# Patient Record
Sex: Female | Born: 1970 | Race: Black or African American | Hispanic: No | Marital: Married | Smoking: Never smoker
Health system: Southern US, Community
[De-identification: ages and names within clinical notes are randomized; demographics above are authoritative.]

## PROBLEM LIST (undated history)

## (undated) DIAGNOSIS — J302 Other seasonal allergic rhinitis: Secondary | ICD-10-CM

## (undated) DIAGNOSIS — N83209 Unspecified ovarian cyst, unspecified side: Secondary | ICD-10-CM

## (undated) DIAGNOSIS — G500 Trigeminal neuralgia: Secondary | ICD-10-CM

## (undated) DIAGNOSIS — I10 Essential (primary) hypertension: Secondary | ICD-10-CM

## (undated) DIAGNOSIS — N189 Chronic kidney disease, unspecified: Secondary | ICD-10-CM

## (undated) HISTORY — DX: Trigeminal neuralgia: G50.0

## (undated) HISTORY — DX: Essential (primary) hypertension: I10

## (undated) HISTORY — DX: Other seasonal allergic rhinitis: J30.2

---

## 1997-09-25 ENCOUNTER — Inpatient Hospital Stay (HOSPITAL_COMMUNITY): Admission: AD | Admit: 1997-09-25 | Discharge: 1997-09-25 | Payer: Self-pay | Admitting: Obstetrics and Gynecology

## 1997-10-01 ENCOUNTER — Inpatient Hospital Stay (HOSPITAL_COMMUNITY): Admission: AD | Admit: 1997-10-01 | Discharge: 1997-10-03 | Payer: Self-pay | Admitting: Obstetrics and Gynecology

## 1998-11-15 ENCOUNTER — Other Ambulatory Visit: Admission: RE | Admit: 1998-11-15 | Discharge: 1998-11-15 | Payer: Self-pay | Admitting: Obstetrics and Gynecology

## 1999-01-04 ENCOUNTER — Encounter: Payer: Self-pay | Admitting: Internal Medicine

## 1999-01-04 ENCOUNTER — Ambulatory Visit (HOSPITAL_COMMUNITY): Admission: RE | Admit: 1999-01-04 | Discharge: 1999-01-04 | Payer: Self-pay | Admitting: Internal Medicine

## 1999-12-28 ENCOUNTER — Other Ambulatory Visit: Admission: RE | Admit: 1999-12-28 | Discharge: 1999-12-28 | Payer: Self-pay | Admitting: Obstetrics and Gynecology

## 2000-05-29 ENCOUNTER — Inpatient Hospital Stay (HOSPITAL_COMMUNITY): Admission: AD | Admit: 2000-05-29 | Discharge: 2000-05-29 | Payer: Self-pay | Admitting: Obstetrics and Gynecology

## 2000-05-30 ENCOUNTER — Inpatient Hospital Stay (HOSPITAL_COMMUNITY): Admission: AD | Admit: 2000-05-30 | Discharge: 2000-05-30 | Payer: Self-pay | Admitting: Obstetrics and Gynecology

## 2000-07-24 ENCOUNTER — Encounter (INDEPENDENT_AMBULATORY_CARE_PROVIDER_SITE_OTHER): Payer: Self-pay | Admitting: Specialist

## 2000-07-24 ENCOUNTER — Inpatient Hospital Stay (HOSPITAL_COMMUNITY): Admission: AD | Admit: 2000-07-24 | Discharge: 2000-07-27 | Payer: Self-pay | Admitting: Obstetrics and Gynecology

## 2000-09-02 ENCOUNTER — Other Ambulatory Visit: Admission: RE | Admit: 2000-09-02 | Discharge: 2000-09-02 | Payer: Self-pay | Admitting: Obstetrics and Gynecology

## 2001-03-10 ENCOUNTER — Emergency Department (HOSPITAL_COMMUNITY): Admission: EM | Admit: 2001-03-10 | Discharge: 2001-03-10 | Payer: Self-pay | Admitting: Internal Medicine

## 2001-03-10 ENCOUNTER — Encounter: Payer: Self-pay | Admitting: Emergency Medicine

## 2001-12-25 ENCOUNTER — Encounter: Payer: Self-pay | Admitting: Internal Medicine

## 2001-12-25 ENCOUNTER — Encounter: Admission: RE | Admit: 2001-12-25 | Discharge: 2001-12-25 | Payer: Self-pay | Admitting: Internal Medicine

## 2003-08-03 ENCOUNTER — Encounter: Admission: RE | Admit: 2003-08-03 | Discharge: 2003-08-03 | Payer: Self-pay | Admitting: Family Medicine

## 2003-12-08 ENCOUNTER — Other Ambulatory Visit: Admission: RE | Admit: 2003-12-08 | Discharge: 2003-12-08 | Payer: Self-pay | Admitting: Family Medicine

## 2004-12-29 ENCOUNTER — Emergency Department (HOSPITAL_COMMUNITY): Admission: EM | Admit: 2004-12-29 | Discharge: 2004-12-30 | Payer: Self-pay | Admitting: Emergency Medicine

## 2005-07-22 ENCOUNTER — Emergency Department (HOSPITAL_COMMUNITY): Admission: EM | Admit: 2005-07-22 | Discharge: 2005-07-22 | Payer: Self-pay | Admitting: Family Medicine

## 2005-07-23 ENCOUNTER — Ambulatory Visit: Payer: Self-pay | Admitting: Family Medicine

## 2005-07-26 ENCOUNTER — Ambulatory Visit: Payer: Self-pay | Admitting: Family Medicine

## 2005-12-03 ENCOUNTER — Ambulatory Visit: Payer: Self-pay | Admitting: Family Medicine

## 2007-04-29 DIAGNOSIS — R519 Headache, unspecified: Secondary | ICD-10-CM | POA: Insufficient documentation

## 2007-04-29 DIAGNOSIS — I10 Essential (primary) hypertension: Secondary | ICD-10-CM

## 2007-04-29 DIAGNOSIS — E119 Type 2 diabetes mellitus without complications: Secondary | ICD-10-CM

## 2007-04-29 DIAGNOSIS — R51 Headache: Secondary | ICD-10-CM

## 2008-08-01 ENCOUNTER — Telehealth: Payer: Self-pay | Admitting: Family Medicine

## 2009-02-10 ENCOUNTER — Ambulatory Visit: Payer: Self-pay | Admitting: Family Medicine

## 2009-02-10 DIAGNOSIS — E049 Nontoxic goiter, unspecified: Secondary | ICD-10-CM | POA: Insufficient documentation

## 2009-02-13 ENCOUNTER — Encounter: Admission: RE | Admit: 2009-02-13 | Discharge: 2009-02-13 | Payer: Self-pay | Admitting: Family Medicine

## 2009-02-14 LAB — CONVERTED CEMR LAB
ALT: 19 units/L (ref 0–35)
AST: 25 units/L (ref 0–37)
Albumin: 3.6 g/dL (ref 3.5–5.2)
Alkaline Phosphatase: 105 units/L (ref 39–117)
BUN: 9 mg/dL (ref 6–23)
Basophils Absolute: 0 10*3/uL (ref 0.0–0.1)
Basophils Relative: 0.6 % (ref 0.0–3.0)
Bilirubin, Direct: 0 mg/dL (ref 0.0–0.3)
CO2: 30 meq/L (ref 19–32)
Calcium: 9.2 mg/dL (ref 8.4–10.5)
Chloride: 103 meq/L (ref 96–112)
Cholesterol: 196 mg/dL (ref 0–200)
Creatinine, Ser: 1 mg/dL (ref 0.4–1.2)
Creatinine,U: 236.9 mg/dL
Eosinophils Absolute: 0.1 10*3/uL (ref 0.0–0.7)
Eosinophils Relative: 1.4 % (ref 0.0–5.0)
Free T4: 0.9 ng/dL (ref 0.6–1.6)
GFR calc non Af Amer: 80.41 mL/min (ref >60)
Glucose, Bld: 93 mg/dL (ref 70–99)
HCT: 37.5 % (ref 36.0–46.0)
HDL: 44.8 mg/dL (ref >39.00)
Hemoglobin: 12.8 g/dL (ref 12.0–15.0)
Hgb A1c MFr Bld: 6 % (ref 4.6–6.5)
LDL Cholesterol: 130 mg/dL — ABNORMAL HIGH (ref 0–99)
Lymphocytes Relative: 29 % (ref 12.0–46.0)
Lymphs Abs: 1.9 10*3/uL (ref 0.7–4.0)
MCHC: 34.1 g/dL (ref 30.0–36.0)
MCV: 92.8 fL (ref 78.0–100.0)
Microalb Creat Ratio: 8.4 mg/g (ref 0.0–30.0)
Microalb, Ur: 2 mg/dL — ABNORMAL HIGH (ref 0.0–1.9)
Monocytes Absolute: 0.7 10*3/uL (ref 0.1–1.0)
Monocytes Relative: 10.1 % (ref 3.0–12.0)
Neutro Abs: 3.9 10*3/uL (ref 1.4–7.7)
Neutrophils Relative %: 58.9 % (ref 43.0–77.0)
Platelets: 293 10*3/uL (ref 150.0–400.0)
Potassium: 3.9 meq/L (ref 3.5–5.1)
RBC: 4.04 M/uL (ref 3.87–5.11)
RDW: 12.4 % (ref 11.5–14.6)
Sodium: 140 meq/L (ref 135–145)
T3, Free: 2.6 pg/mL (ref 2.3–4.2)
TSH: 0.56 microintl units/mL (ref 0.35–5.50)
Total Bilirubin: 1.2 mg/dL (ref 0.3–1.2)
Total CHOL/HDL Ratio: 4
Total Protein: 7.7 g/dL (ref 6.0–8.3)
Triglycerides: 104 mg/dL (ref 0.0–149.0)
VLDL: 20.8 mg/dL (ref 0.0–40.0)
WBC: 6.6 10*3/uL (ref 4.5–10.5)

## 2011-01-11 NOTE — Op Note (Signed)
[facility]  Patient:    Cynthia Roach, Cynthia Roach                 MRN: [id number] Proc. Date: 07/24/00 Adm. Date:  [id number] Attending:  Lenoard Aden                           Operative Report  PREOPERATIVE DIAGNOSIS:       Failure to progress beyond 9.5 cm and undesired fertility.  POSTOPERATIVE DIAGNOSIS:      Failure to progress beyond 9.5 cm and undesired fertility, fetal macrosomia.  PROCEDURE:                    Primary low transverse cesarean section and bilateral tubal ligation, Pomeroy method.  SURGEON:                      Marina Gravel, M.D.  ASSISTANT:                    Genia Del, M.D.  ANESTHESIA:                   Epidural.  FINDINGS:                     Viable female infant.  Apgars 8 and 9. Weight 9 pounds 14 ounces.  Neural ______ tubes.  Polycystic ovaries.  ESTIMATED BLOOD LOSS:         1000 cc.  FLUIDS:                       1500 cc.  URINE:                        325 cc.  DRAINS:                       Foley.  COMPLICATIONS:                None.  INDICATIONS:                  The patient progressed in labor to anterior lip complete effacement, +1 station, transverse position.  She did not progress beyond this for five hours.  Documented adequate contractions for over two-and-a-half hours by UPC.  In addition, the patient expressed the desire for permanent sterilization.  Alternative methods, failure rate, ectopic risks were discussed.  DESCRIPTION OF PROCEDURE:     The patient was taken to the operating room with epidural anesthesia in place.  She was prepped and draped in the standard fashion with a Foley catheter in the bladder.  A Pfannenstiel incision was made with the knife and carried to the underlying fascia sharply.  The fascia was divided in the midline, and the incision was continued laterally with Mayo scissors.  Kocher clamps were used to elevate the superior aspect of the incision, and the  underlying rectus muscles were dissected off sharply, repeated inferiorly in a similar fashion.  The rectus muscle was identified.  The underlying peritoneum was elevated and entered sharply.  It was extended superiorly and inferiorly sharply with good visualization of the surrounding organs.  A bladder blade was inserted.  The vesicouterine peritoneum was identified and elevated.  The bladder flap was created with a sharp and blunt technique.  The bladder blade was reinserted, and the uterine  incision was made with the knife in a low transverse fashion.  The infants head was delivered.   Left occiput transverse position was noted. The nose and mouth were suctioned with the bulb, and the remainder nuchal cord x 1 noted.  The remainder of the infant was delivered without difficulty.  The cord was clamped and cut, and the infant was handed off to the waiting pediatricians.  Two grams of Ceftin was given at cord clamp.  The placenta was removed manually, and the uterus was exteriorized and cleared of all clots and debris.  The bladder blade was reinserted, and the uterine incision was inspected.  There was an extension on the left side laterally to the lateral extent of the uterus.  An active bleeding vessel was noted.  A ring clamp was placed for temporary hemostasis.  The remainder of the incision was free of extension.  The ureter was identified and noted to be well inferior to this area.  The broad ligament was placed on traction laterally away from the area of bleeding.  This was made hemostatic with a running stitch of #0 Monocryl.  The remainder of the uterine incision was closed with a running locked stitch of the same suture.  A second imbricating layer was placed for hemostasis.  The left tube was identified and followed to the fimbriated end.  This was grasped with a Babcock clamp approximately 2 cm distal to the cornu.  A segment was doubly tied with a plain suture and  excised sharply.  Hemostasis was obtained.  Repeat on the opposite side in a similar fashion.  The uterine incision was reinspected and was hemostatic as were the tubal incisions.  The uterus was replaced into the abdomen, and the pelvis was irrigated with normal saline.  The uterine incision and tubal incisions were reinspected. Hemostasis was noted.  The patient was noted to be tachycardic at this time to the 130-140s.  This was presumed to be due to pain.  No active bleeding was noted at this point.  No retroperitoneal hematomas were visualized; therefore, closure continued.  The subfascial space and bladder flap were inspected and made hemostatic with the Bovie.  The fascia was closed with a running stitch of #0 Vicryl.  The subcutaneous tissue was irrigated and made hemostatic with the Bovie.  The skin was closed with staples.  The patient tolerated the procedure well.  There were no complications.  She was taken to the recovery room awake, alert, and in stable condition.  All counts were correct per the operating room staff.  In the recovery room, the patients tachycardia had improved to the 110s. Serial hemoglobins will be followed. DD:  07/24/00 TD:  07/24/00 Job: [id number]

## 2011-01-11 NOTE — Discharge Summary (Signed)
[facility]  Patient:    Cynthia Roach, Cynthia Roach                 MRN: [id number] Adm. Date:  [id number] Disc. Date: [id number] Attending:  Lenoard Aden                           Discharge Summary  ADMISSION DIAGNOSES:          1. Term intrauterine pregnancy.                               2. Polyhydramnios.                               3. Favorable cervix.  PROCEDURES:                   1. Induction of labor.                               2. Low transverse cesarean section for failure                                  to progress.                               3. Bilateral tubal ligation.  HISTORY OF PRESENT ILLNESS:   For complete details, please see the history and physical on the chart.  Briefly, the patient was admitted at 40 weeks, a gravida 3, para 1-1-0-2, with persistent polyhydramnios and favorable.  She was admitted for induction of labor.  In addition, she was noted to be group B streptococcus positive and was given appropriate antibiotic prophylaxis for this diagnosis.  HOSPITAL COURSE:              The patient was admitted to labor and delivery. She was noted to be 4 cm dilated and membranes were ruptured artificially with clear fluid noted.  She was started on Pitocin and progressed well to anterior lip left occipitotransverse position, +1 station.  Intrauterine pressure catheter showed greater than two and a half hours of adequate labor and greater than five hours of no cervical change.  The patient was subsequently taken to the operating room for a primary low transverse cesarean section for arrest of dilatation despite adequate labor and transverse position.  In addition, the patient had expressed her desire for permanent sterilization and therefore also underwent bilateral tubal ligation.  On July 24, 2000, the patient underwent a primary low transverse cesarean section and bilateral tubal ligation.  A viable female infant with  Apgars of 8 and 9 and weight 9 pounds 14 ounces was delivered.  Normal tubes and uterus.  The ovaries appeared polycystic.  At the time of surgery, there was an extension of the uterine incision on the left side into an active bleeding vessel.  This was ligated and made hemostatic after identification of surrounding structures.  The estimated blood loss was approximately 1000 cc.  Postoperatively, the patient rapidly regained her ability to ambulate and void and tolerate a regular diet.  She was noted to be significantly anemic with a postoperative hemoglobin of 5.6.  She was tachycardia with a decreased urine output and feeling somewhat fatigued.  Given these signs and symptoms, transfusion was recommended.  The patient received two units of packed red blood cells without complications, after which her tachycardia resolved and her symptoms of fatigue significantly improved as did her tachycardia.  The post transfusion was 7.5.  Otherwise the patients postoperative course was unremarkable.  She remained afebrile with stable vital signs throughout the remainder of her postoperative stay.  DISPOSITION:                  The patient was discharged home in satisfactory condition.  DISCHARGE INSTRUCTIONS:       Routine preprinted discharge instructions were given.  DISCHARGE MEDICATIONS:        1. Tylox one to two tablets every four to six                                  hours as needed for pain.                               2. Motrin 800 mg every eight hours as needed for                                  pain.                               3. Ferrous sulfate 325 mg p.o. t.i.d.                               4. Vitamin C 500 mg p.o. b.i.d.  FOLLOW-UP:                    Follow up in four weeks at [facility].  CONDITION ON DISCHARGE:       Stable. DD:  08/25/00 TD:  08/25/00 Job: [id number]

## 2011-01-11 NOTE — H&P (Signed)
[facility]  Patient:    MORGAN, KEINATH                 MRN: [id number] Adm. Date:  [id number] Attending:  Lenoard Aden CC:         [facility]   History and Physical  CHIEF COMPLAINT:  Induction for polyhydramnios.  HISTORY OF PRESENT ILLNESS:  A 40 year old black female G3, P1-1-0-2, EDD of July 23, 2000, Q-T 40 weeks with persistent polyhydramnios and favorable cervix for induction.  PAST MEDICAL HISTORY:  Remarkable for no known drug allergies.  MEDICATIONS:  Prenatal vitamins and iron.  OBSTETRIC HISTORY:  Vaginal delivery at 26 weeks, 1995.  Vaginal delivery in 1999, no complications except for prematurity.  No other medical or surgical hospitalizations.  FAMILY HISTORY:  Diabetes and myocardial infarction.  PRENATAL LABORATORY DATA:  blood type of A+, Rh antibody negative.  Sickle cell trait negative.  Rubella immune.  Hepatitis B surface antigen negative. HIV nonreactive.  GBS positive.  Pregnancy complicated by polyhydramnios and questionable borderline macrosomia.  PHYSICAL EXAMINATION:  GENERAL: T he patient is a well-developed well-nourished white female in no acute distress.  HEENT:  Normal.  LUNGS:  Clear.  HEART:  Regular rate and rhythm.  ABDOMEN:  Soft, gravid and nontender.  ESTIMATED FETAL WEIGHT:  7-1/2 to 8 pounds.  CERVIX:  3-4 cm, thick but stretchy.  Vertex -2, artificial rupture of membranes clear.  EXTREMITIES:  REveal no cords.  NEUROLOGIC:  Nonfocal.  IMPRESSION:  Term intrauterine pregnancy with persistent polyhydramnios.  PLAN:  Proceed with induction.  Anticipate attempts at vaginal delivery, antepartum prophylaxis for group B streptococcus. DD:  07/24/00 TD:  07/24/00 Job: [id number]

## 2011-01-23 ENCOUNTER — Encounter: Payer: Self-pay | Admitting: Family Medicine

## 2011-01-23 ENCOUNTER — Ambulatory Visit (INDEPENDENT_AMBULATORY_CARE_PROVIDER_SITE_OTHER): Payer: 59 | Admitting: Family Medicine

## 2011-01-23 VITALS — BP 142/94 | Temp 98.5°F | Ht 65.0 in | Wt 177.0 lb

## 2011-01-23 DIAGNOSIS — G500 Trigeminal neuralgia: Secondary | ICD-10-CM

## 2011-01-23 MED ORDER — GABAPENTIN 300 MG PO CAPS: 300.0000 mg | ORAL_CAPSULE | Freq: Two times a day (BID) | ORAL | Status: DC

## 2011-01-23 MED ORDER — HYDROCODONE-ACETAMINOPHEN 7.5-500 MG PO TABS: 2.0000 | ORAL_TABLET | Freq: Four times a day (QID) | ORAL | Status: DC | PRN

## 2011-01-23 MED ORDER — METHYLPREDNISOLONE ACETATE 80 MG/ML IJ SUSP
120.0000 mg | Freq: Once | INTRAMUSCULAR | Status: AC
  Administered 2011-01-23: 120 mg via INTRAMUSCULAR

## 2011-01-23 NOTE — Progress Notes (Signed)
  Subjective:    Patient ID: Cynthia Roach, female    DOB: Aug 25, 1971, 40 y.o.   MRN: [id number]  HPI She has a hx of recurrent trigeminal neuralgia, and these episodes have been much more frequent over the past 2 months. She usually takes a few Advil and the pain goes away after a day or two, but this time it has lasted for 3 days and it is  more severe than usual. She has sharp pains over the right side of her face that wax and wane. No vision changes and no other neurologic deficits.    Review of Systems  Constitutional: Negative.   HENT: Negative.   Eyes: Negative.   Respiratory: Negative.   Cardiovascular: Negative.   Neurological: Negative.        Objective:   Physical Exam  Constitutional: She is oriented to person, place, and time.       Alert, in pain   HENT:  Head: Normocephalic and atraumatic.  Right Ear: External ear normal.  Left Ear: External ear normal.  Mouth/Throat: Oropharynx is clear and moist.  Eyes: Conjunctivae and EOM are normal. Pupils are equal, round, and reactive to light.  Neck: Normal range of motion. Neck supple. No thyromegaly present.  Cardiovascular: Normal rate, regular rhythm, normal heart sounds and intact distal pulses.   Pulmonary/Chest: Effort normal and breath sounds normal.  Lymphadenopathy:    She has no cervical adenopathy.  Neurological: She is alert and oriented to person, place, and time. She has normal reflexes. No cranial nerve deficit. She exhibits normal muscle tone. Coordination normal.          Assessment & Plan:  Treat the acute episode of neuralgia with a steroid shot and Vicodin. Start on Neurontin to slow down recurrences.

## 2011-08-12 ENCOUNTER — Other Ambulatory Visit: Payer: Self-pay | Admitting: Family Medicine

## 2011-08-12 ENCOUNTER — Ambulatory Visit (INDEPENDENT_AMBULATORY_CARE_PROVIDER_SITE_OTHER): Payer: Managed Care, Other (non HMO) | Admitting: Family Medicine

## 2011-08-12 ENCOUNTER — Encounter: Payer: Self-pay | Admitting: Family Medicine

## 2011-08-12 DIAGNOSIS — R030 Elevated blood-pressure reading, without diagnosis of hypertension: Secondary | ICD-10-CM

## 2011-08-12 DIAGNOSIS — R300 Dysuria: Secondary | ICD-10-CM

## 2011-08-12 LAB — POCT URINALYSIS DIPSTICK
Bilirubin, UA: NEGATIVE
Glucose, UA: NEGATIVE
Ketones, UA: NEGATIVE
Nitrite, UA: NEGATIVE
Spec Grav, UA: >=1.03
Urobilinogen, UA: 0.2
pH, UA: 6

## 2011-08-12 MED ORDER — CIPROFLOXACIN HCL 500 MG PO TABS: 500.0000 mg | ORAL_TABLET | Freq: Two times a day (BID) | ORAL | Status: AC

## 2011-08-12 NOTE — Patient Instructions (Signed)
Urinary Tract Infection Infections of the urinary tract can start in several places. A bladder infection (cystitis), a kidney infection (pyelonephritis), and a prostate infection (prostatitis) are different types of urinary tract infections (UTIs). They usually get better if treated with medicines (antibiotics) that kill germs. Take all the medicine until it is gone. You or your child may feel better in a few days, but TAKE ALL MEDICINE or the infection may not respond and may become more difficult to treat. HOME CARE INSTRUCTIONS   Drink enough water and fluids to keep the urine clear or pale yellow. Cranberry juice is especially recommended, in addition to large amounts of water.   Avoid caffeine, tea, and carbonated beverages. They tend to irritate the bladder.   Alcohol may irritate the prostate.   Only take over-the-counter or prescription medicines for pain, discomfort, or fever as directed by your caregiver.  To prevent further infections:  Empty the bladder often. Avoid holding urine for long periods of time.   After a bowel movement, women should cleanse from front to back. Use each tissue only once.   Empty the bladder before and after sexual intercourse.  FINDING OUT THE RESULTS OF YOUR TEST Not all test results are available during your visit. If your or your child's test results are not back during the visit, make an appointment with your caregiver to find out the results. Do not assume everything is normal if you have not heard from your caregiver or the medical facility. It is important for you to follow up on all test results. SEEK MEDICAL CARE IF:   There is back pain.   Your baby is older than 3 months with a rectal temperature of 100.5 F (38.1 C) or higher for more than 1 day.   Your or your child's problems (symptoms) are no better in 3 days. Return sooner if you or your child is getting worse.  SEEK IMMEDIATE MEDICAL CARE IF:   There is severe back pain or lower  abdominal pain.   You or your child develops chills.   You have a fever.   Your baby is older than 3 months with a rectal temperature of 102 F (38.9 C) or higher.   Your baby is 40 months old or younger with a rectal temperature of 100.4 F (38 C) or higher.   There is nausea or vomiting.   There is continued burning or discomfort with urination.  MAKE SURE YOU:   Understand these instructions.   Will watch your condition.   Will get help right away if you are not doing well or get worse.  Document Released: 05/22/2005 Document Revised: 04/24/2011 Document Reviewed: 12/25/2006 [facility] Patient Information 2012 [place], [place].  Monitor blood pressure over the next few weeks and follow up with primary if consistently over 140/90

## 2011-08-12 NOTE — Progress Notes (Signed)
  Subjective:    Patient ID: Cynthia Roach, female    DOB: Mar 15, 1971, 40 y.o.   MRN: [id number]  HPI  Dysuria. Patient relates about 5 day history of burning with urination some urgency and frequency. No back pain. No nausea or vomiting. Denies fever or chills. No history of frequent UTI. No known drug allergies. No alleviating factors   Review of Systems  Constitutional: Negative for fever, chills and appetite change.  Gastrointestinal: Negative for nausea, vomiting, abdominal pain, diarrhea and constipation.  Genitourinary: Positive for dysuria and frequency.  Musculoskeletal: Negative for back pain.  Neurological: Negative for dizziness.       Objective:   Physical Exam  Constitutional: She appears well-developed and well-nourished.  Cardiovascular: Normal rate and regular rhythm.   Pulmonary/Chest: Effort normal and breath sounds normal. No respiratory distress. She has no wheezes. She has no rales.  Musculoskeletal:       No CVA tenderness          Assessment & Plan:  Uncomplicated cystitis. Cipro 500 mg twice daily for 5 days. Elevated blood pressure. Monitor closely next few weeks and followup with primary if consistently greater than 140/90

## 2011-11-18 ENCOUNTER — Other Ambulatory Visit: Payer: Self-pay | Admitting: Family Medicine

## 2011-12-20 ENCOUNTER — Other Ambulatory Visit: Payer: Self-pay | Admitting: Family Medicine

## 2012-01-14 ENCOUNTER — Emergency Department (HOSPITAL_BASED_OUTPATIENT_CLINIC_OR_DEPARTMENT_OTHER)
Admission: EM | Admit: 2012-01-14 | Discharge: 2012-01-14 | Disposition: A | Discharge status: Home / Self Care | Payer: Managed Care, Other (non HMO) | Attending: Emergency Medicine | Admitting: Emergency Medicine

## 2012-01-14 ENCOUNTER — Encounter (HOSPITAL_BASED_OUTPATIENT_CLINIC_OR_DEPARTMENT_OTHER): Payer: Self-pay

## 2012-01-14 ENCOUNTER — Emergency Department (HOSPITAL_BASED_OUTPATIENT_CLINIC_OR_DEPARTMENT_OTHER): Payer: Managed Care, Other (non HMO)

## 2012-01-14 DIAGNOSIS — R110 Nausea: Secondary | ICD-10-CM | POA: Insufficient documentation

## 2012-01-14 DIAGNOSIS — I10 Essential (primary) hypertension: Secondary | ICD-10-CM | POA: Insufficient documentation

## 2012-01-14 DIAGNOSIS — N1330 Unspecified hydronephrosis: Secondary | ICD-10-CM | POA: Insufficient documentation

## 2012-01-14 DIAGNOSIS — R1032 Left lower quadrant pain: Secondary | ICD-10-CM | POA: Insufficient documentation

## 2012-01-14 DIAGNOSIS — R197 Diarrhea, unspecified: Secondary | ICD-10-CM | POA: Insufficient documentation

## 2012-01-14 DIAGNOSIS — N201 Calculus of ureter: Secondary | ICD-10-CM | POA: Insufficient documentation

## 2012-01-14 HISTORY — DX: Unspecified ovarian cyst, unspecified side: N83.209

## 2012-01-14 LAB — URINALYSIS, ROUTINE W REFLEX MICROSCOPIC
Bilirubin Urine: NEGATIVE
Glucose, UA: NEGATIVE mg/dL
Hgb urine dipstick: NEGATIVE
Ketones, ur: NEGATIVE mg/dL
Protein, ur: NEGATIVE mg/dL
Urobilinogen, UA: 0.2 mg/dL (ref 0.0–1.0)

## 2012-01-14 LAB — HEPATIC FUNCTION PANEL
Albumin: 3.9 g/dL (ref 3.5–5.2)
Alkaline Phosphatase: 115 U/L (ref 39–117)
Bilirubin, Direct: <0.1 mg/dL (ref 0.0–0.3)
Total Bilirubin: 0.5 mg/dL (ref 0.3–1.2)

## 2012-01-14 LAB — CBC
HCT: 38.8 % (ref 36.0–46.0)
MCH: 30.7 pg (ref 26.0–34.0)
MCHC: 34.5 g/dL (ref 30.0–36.0)
MCV: 88.8 fL (ref 78.0–100.0)
RDW: 12.3 % (ref 11.5–15.5)
WBC: 9 10*3/uL (ref 4.0–10.5)

## 2012-01-14 LAB — DIFFERENTIAL
Basophils Absolute: 0 10*3/uL (ref 0.0–0.1)
Basophils Relative: 0 % (ref 0–1)
Eosinophils Absolute: 0.2 10*3/uL (ref 0.0–0.7)
Monocytes Relative: 10 % (ref 3–12)
Neutrophils Relative %: 59 % (ref 43–77)

## 2012-01-14 LAB — BASIC METABOLIC PANEL
BUN: 13 mg/dL (ref 6–23)
CO2: 29 mEq/L (ref 19–32)
Chloride: 103 mEq/L (ref 96–112)
Creatinine, Ser: 1.2 mg/dL — ABNORMAL HIGH (ref 0.50–1.10)
Glucose, Bld: 103 mg/dL — ABNORMAL HIGH (ref 70–99)

## 2012-01-14 MED ORDER — IOHEXOL 300 MG/ML  SOLN
100.0000 mL | Freq: Once | INTRAMUSCULAR | Status: AC | PRN
  Administered 2012-01-14: 100 mL via INTRAVENOUS

## 2012-01-14 MED ORDER — KETOROLAC TROMETHAMINE 30 MG/ML IJ SOLN
30.0000 mg | Freq: Once | INTRAMUSCULAR | Status: AC
  Administered 2012-01-14: 30 mg via INTRAVENOUS
  Filled 2012-01-14: qty 1

## 2012-01-14 MED ORDER — SODIUM CHLORIDE 0.9 % IV BOLUS (SEPSIS)
1000.0000 mL | Freq: Once | INTRAVENOUS | Status: AC
  Administered 2012-01-14: 1000 mL via INTRAVENOUS

## 2012-01-14 MED ORDER — IOHEXOL 300 MG/ML  SOLN: 20.0000 mL | INTRAMUSCULAR | Status: AC

## 2012-01-14 MED ORDER — ONDANSETRON HCL 4 MG/2ML IJ SOLN
4.0000 mg | Freq: Once | INTRAMUSCULAR | Status: AC
  Administered 2012-01-14: 4 mg via INTRAVENOUS
  Filled 2012-01-14: qty 2

## 2012-01-14 MED ORDER — HYDROMORPHONE HCL PF 1 MG/ML IJ SOLN
1.0000 mg | Freq: Once | INTRAMUSCULAR | Status: AC
  Administered 2012-01-14: 1 mg via INTRAVENOUS
  Filled 2012-01-14: qty 1

## 2012-01-14 MED ORDER — ONDANSETRON HCL 4 MG PO TABS: 4.0000 mg | ORAL_TABLET | Freq: Four times a day (QID) | ORAL | Status: AC

## 2012-01-14 MED ORDER — OXYCODONE-ACETAMINOPHEN 5-325 MG PO TABS: 2.0000 | ORAL_TABLET | ORAL | Status: AC | PRN

## 2012-01-14 MED ORDER — ONDANSETRON HCL 4 MG PO TABS: 4.0000 mg | ORAL_TABLET | Freq: Four times a day (QID) | ORAL | Status: DC

## 2012-01-14 NOTE — Discharge Instructions (Signed)

## 2012-01-14 NOTE — ED Provider Notes (Signed)
History     CSN: [id number]  Arrival date & time 01/14/12  1820   First MD Initiated Contact with Patient 01/14/12 1909      Chief Complaint  Patient presents with  . Abdominal Pain    (Consider location/radiation/quality/duration/timing/severity/associated sxs/prior treatment) HPI Comments: Pt c/o llq pain:pt states that she has a history of ovarian cyst, but this is very different  Patient is a 41 y.o. female presenting with abdominal pain. The history is provided by the patient. No language interpreter was used.  Abdominal Pain The primary symptoms of the illness include abdominal pain, nausea and diarrhea. The primary symptoms of the illness do not include fever, vomiting, dysuria, vaginal discharge or vaginal bleeding. The current episode started 6 to 12 hours ago. The onset of the illness was sudden. The problem has not changed since onset. The patient states that she believes she is currently not pregnant. The patient has not had a change in bowel habit.    Past Medical History  Diagnosis Date  . Heart murmur   . Seasonal allergies   . Hypertension   . Trigeminal neuralgia   . Gestational diabetes   . Ovarian cyst     Past Surgical History  Procedure Date  . Cesarean section     Family History  Problem Relation Age of Onset  . Alcohol abuse    . Arthritis    . Breast cancer    . Colon cancer    . Diabetes    . Hyperlipidemia    . Hypertension    . Prostate cancer    . Depression    . Stroke    . Sudden death    . Coronary artery disease      History  Substance Use Topics  . Smoking status: Never Smoker   . Smokeless tobacco: Not on file  . Alcohol Use: No    OB History    Grav Para Term Preterm Abortions TAB SAB Ect Mult Living                  Review of Systems  Constitutional: Negative for fever.  Respiratory: Negative.   Cardiovascular: Negative.   Gastrointestinal: Positive for nausea, abdominal pain and diarrhea. Negative for  vomiting.  Genitourinary: Negative for dysuria, vaginal bleeding and vaginal discharge.  Neurological: Negative.     Allergies  Shellfish allergy  Home Medications  No current outpatient prescriptions on file.  BP 154/100  Pulse 64  Temp(Src) 98.6 F (37 C) (Oral)  Resp 18  Ht 5\' 5"  (1.651 m)  Wt 159 lb (72.122 kg)  BMI 26.46 kg/m2  SpO2 100%  LMP 10/10/2011  Physical Exam  Nursing note and vitals reviewed. Constitutional: She is oriented to person, place, and time. She appears well-developed and well-nourished.  HENT:  Head: Normocephalic and atraumatic.  Neck: Neck supple.  Cardiovascular: Normal rate and regular rhythm.   Pulmonary/Chest: Effort normal and breath sounds normal.  Abdominal: Soft. Bowel sounds are normal. There is tenderness in the left lower quadrant.  Musculoskeletal: Normal range of motion.  Neurological: She is alert and oriented to person, place, and time.  Skin: Skin is warm and dry.    ED Course  Procedures (including critical care time)  Labs Reviewed  URINALYSIS, ROUTINE W REFLEX MICROSCOPIC - Abnormal; Notable for the following:    APPearance CLOUDY (*)    All other components within normal limits  BASIC METABOLIC PANEL - Abnormal; Notable for the following:  Glucose, Bld 103 (*)    Creatinine, Ser 1.20 (*)    GFR calc non Af Amer 56 (*)    GFR calc Af Amer 65 (*)    All other components within normal limits  PREGNANCY, URINE  CBC  LIPASE, BLOOD  HEPATIC FUNCTION PANEL  DIFFERENTIAL   Ct Abdomen Pelvis W Contrast  01/14/2012  *RADIOLOGY REPORT*  Clinical Data: Left lower quadrant abdominal pain, nausea, diarrhea  CT ABDOMEN AND PELVIS WITH CONTRAST  Technique:  Multidetector CT imaging of the abdomen and pelvis was performed following the standard protocol during bolus administration of intravenous contrast.  Contrast: OMNIPAQUE IOHEXOL 300 MG/ML  SOLN  Comparison: None.  Findings: Very mild dependent atelectasis at the lung  bases.  Liver, spleen, pancreas, and adrenal glands are within normal limits.  Gallbladder is unremarkable.  No intrahepatic or extrahepatic ductal dilatation.  Three nonobstructing left lower pole renal calculi measuring up to 5 mm (series 3/image 41).  Moderate left hydroureteronephrosis.  3 mm nonobstructing right upper pole renal calculus (series 3/image 28).  No hydronephrosis.  No evidence of bowel obstruction.  Normal appendix.  No colonic wall thickening or inflammatory changes.  No evidence of abdominal aortic aneurysm.  No abdominopelvic ascites.  No suspicious abdominopelvic lymphadenopathy.  Uterus and bilateral ovaries are unremarkable.  6 mm distal left ureteral calculus near the UVJ (series 3/image 78).  Additional 3 mm calculus in the distal left ureter.  No bladder calculi.  Visualized osseous structures are within normal limits.  IMPRESSION: 6 mm distal left ureteral calculus near the UVJ.  Additional 3 mm distal left ureteral calculus.  Moderate left hydroureteronephrosis.  Additional bilateral nonobstructing renal calculi, as above.  Original Report Authenticated By: Charline Bills, M.D.     1. Ureteral stone       MDM  Pt is comfortable at this time:no infection noted:pt given urology follow up        Teressa Lower, NP 01/14/12 2127

## 2012-01-14 NOTE — ED Notes (Signed)
Pt offered more pain medication, pt declined. Pt rates pain 6/10 now.

## 2012-01-14 NOTE — ED Notes (Signed)
LLQ pain, nausea, diarrhea since 12pm

## 2012-01-14 NOTE — ED Notes (Signed)
Patient transported to CT 

## 2012-01-14 NOTE — ED Notes (Signed)
Family at bedside. 

## 2012-01-14 NOTE — ED Provider Notes (Signed)
Medical screening examination/treatment/procedure(s) were performed by non-physician practitioner and as supervising physician I was immediately available for consultation/collaboration.  Doug Sou, MD 01/14/12 2352

## 2012-01-30 ENCOUNTER — Other Ambulatory Visit: Payer: Self-pay | Admitting: Urology

## 2012-02-14 ENCOUNTER — Encounter (HOSPITAL_COMMUNITY): Payer: Self-pay | Admitting: Pharmacy Technician

## 2012-02-18 ENCOUNTER — Encounter (HOSPITAL_COMMUNITY): Payer: Self-pay | Admitting: *Deleted

## 2012-02-18 NOTE — Progress Notes (Signed)
Checked with Coleen at [facility]. EKG is not needed for asymptomatic heart murmur

## 2012-02-18 NOTE — Progress Notes (Signed)
Patient instructed no Aspirin products 3 days prior to ESWL

## 2012-02-20 ENCOUNTER — Ambulatory Visit (HOSPITAL_COMMUNITY)
Admission: RE | Admit: 2012-02-20 | Discharge: 2012-02-20 | Disposition: A | Discharge status: Home / Self Care | Payer: Managed Care, Other (non HMO) | Source: Ambulatory Visit | Attending: Urology | Admitting: Urology

## 2012-02-20 ENCOUNTER — Encounter (HOSPITAL_COMMUNITY): Payer: Self-pay | Admitting: *Deleted

## 2012-02-20 ENCOUNTER — Encounter (HOSPITAL_COMMUNITY)
Admission: RE | Disposition: A | Discharge status: Home / Self Care | Payer: Self-pay | Source: Ambulatory Visit | Attending: Urology

## 2012-02-20 ENCOUNTER — Ambulatory Visit (HOSPITAL_COMMUNITY): Payer: Managed Care, Other (non HMO)

## 2012-02-20 DIAGNOSIS — K219 Gastro-esophageal reflux disease without esophagitis: Secondary | ICD-10-CM | POA: Insufficient documentation

## 2012-02-20 DIAGNOSIS — N200 Calculus of kidney: Secondary | ICD-10-CM | POA: Insufficient documentation

## 2012-02-20 DIAGNOSIS — N201 Calculus of ureter: Secondary | ICD-10-CM | POA: Insufficient documentation

## 2012-02-20 DIAGNOSIS — Z79899 Other long term (current) drug therapy: Secondary | ICD-10-CM | POA: Insufficient documentation

## 2012-02-20 DIAGNOSIS — G500 Trigeminal neuralgia: Secondary | ICD-10-CM | POA: Insufficient documentation

## 2012-02-20 HISTORY — DX: Chronic kidney disease, unspecified: N18.9

## 2012-02-20 LAB — PREGNANCY, URINE: Preg Test, Ur: NEGATIVE

## 2012-02-20 SURGERY — LITHOTRIPSY, ESWL
Anesthesia: LOCAL | Laterality: Left

## 2012-02-20 MED ORDER — DIAZEPAM 5 MG PO TABS
10.0000 mg | ORAL_TABLET | ORAL | Status: AC
  Administered 2012-02-20: 10 mg via ORAL

## 2012-02-20 MED ORDER — CIPROFLOXACIN HCL 500 MG PO TABS
500.0000 mg | ORAL_TABLET | ORAL | Status: AC
  Administered 2012-02-20: 500 mg via ORAL

## 2012-02-20 MED ORDER — HYDROMORPHONE HCL 2 MG PO TABS: 2.0000 mg | ORAL_TABLET | ORAL | Status: AC | PRN

## 2012-02-20 MED ORDER — HYDROMORPHONE HCL 2 MG PO TABS: 2.0000 mg | ORAL_TABLET | ORAL | Status: DC | PRN

## 2012-02-20 MED ORDER — DIAZEPAM 5 MG PO TABS
ORAL_TABLET | ORAL | Status: AC
  Administered 2012-02-20: 10 mg via ORAL
  Filled 2012-02-20: qty 2

## 2012-02-20 MED ORDER — HYDROMORPHONE HCL 2 MG PO TABS
ORAL_TABLET | ORAL | Status: AC
  Filled 2012-02-20: qty 1

## 2012-02-20 MED ORDER — DIPHENHYDRAMINE HCL 25 MG PO CAPS
25.0000 mg | ORAL_CAPSULE | ORAL | Status: AC
  Administered 2012-02-20: 25 mg via ORAL

## 2012-02-20 MED ORDER — DIPHENHYDRAMINE HCL 25 MG PO CAPS
ORAL_CAPSULE | ORAL | Status: AC
  Administered 2012-02-20: 25 mg via ORAL
  Filled 2012-02-20: qty 1

## 2012-02-20 MED ORDER — CIPROFLOXACIN HCL 500 MG PO TABS
ORAL_TABLET | ORAL | Status: AC
  Administered 2012-02-20: 500 mg via ORAL
  Filled 2012-02-20: qty 1

## 2012-02-20 MED ORDER — SODIUM CHLORIDE 0.9 % IV SOLN
INTRAVENOUS | Status: DC
  Administered 2012-02-20: 11:00:00 via INTRAVENOUS

## 2012-02-20 NOTE — Discharge Instructions (Signed)
Lithotripsy Care After Refer to this sheet for the next few weeks. These discharge instructions provide you with general information on caring for yourself after you leave the hospital. Your caregiver may also give you specific instructions. Your treatment has been planned according to the most current medical practices available, but unavoidable complications sometimes occur. If you have any problems or questions after discharge, please call your caregiver. AFTER THE PROCEDURE   The recovery time will vary with the procedure done.   You will be taken to the recovery area. A nurse will watch and check your progress. Once you are awake, stable, and taking fluids well, you will be allowed to go home as long as there are no problems.   Your urine may have a red tinge for a few days after treatment. Blood loss is usually minimal.   You may have soreness in the back or flank area. This usually goes away after a few days. The procedure can cause blotches or bruises on the back where the pressure wave enters the skin. These marks usually cause only minimal discomfort and should disappear in a short time.   Stone fragments should begin to pass within 24 hours of treatment. However, a delayed passage is not unusual.   You may have pain, discomfort, and feel sick to your stomach (nauseous) when the crushed (pulverized) fragments of stone are passed down the tube from the kidney to the bladder. Stone fragments can pass soon after the procedure and may last for up to 4 to 8 weeks.   A small number of patients may have severe pain when stone fragments are not able to pass, which leads to an obstruction.   If your stone is greater than 1 inch/2.5 centimeters in diameter or if you have multiple stones that have a combined diameter greater than 1 inch/2.5 centimeters, you may require more than 1 treatment.   You must have someone drive you home.  HOME CARE INSTRUCTIONS   Rest at home until you feel your  energy improving.   Only take over-the-counter or prescription medicines for pain, discomfort, or fever as directed by your caregiver. Depending on the type of lithotripsy, you may need to take medicines (antibiotics) that kill germs and anti-inflammatory medicines for a few days.   Drink enough water and fluids to keep your urine clear or pale yellow. This helps "flush" your kidneys. It helps pass any remaining pieces of stone and prevents stones from coming back.   Most people can resume daily activities within 1 or 2 days after standard lithotripsy. It can take longer to recover from laser and percutaneous lithotripsy.   If the stones are in your urinary system, you may be asked to strain your urine at home to look for stones. Any stones that are found can be sent to a medical lab for examination.   Visit your caregiver for a follow-up appointment in a few weeks. Your doctor may remove your stent if you have one. Your caregiver will also check to see whether stone particles still remain.  SEEK MEDICAL CARE IF:   You have an oral temperature above 102 F (38.9 C).   Your pain is not relieved by medicine.   You have a lasting nauseous feeling.   You feel there is too much blood in the urine.   You develop persistent problems with frequent and/or painful urination that does not at least partially improve after 2 days following the procedure.   You have a congested   cough.   You feel lightheaded.   You develop a rash or any other signs that might suggest an allergic problem.   You develop any reaction or side effects to your medicine(s).  SEEK IMMEDIATE MEDICAL CARE IF:   You experience severe back and/or flank pain.   You see nothing but blood when you urinate.   You cannot pass any urine at all.   You have an oral temperature above 102 F (38.9 C), not controlled by medicine.   You develop shortness of breath, difficulty breathing, or chest pain.   You develop vomiting  that will not stop after 6 to 8 hours.   You have a fainting episode.  MAKE SURE YOU:   Understand these instructions.   Will watch your condition.   Will get help right away if you are not doing well or get worse.  Document Released: 09/01/2007 Document Revised: 08/01/2011 Document Reviewed: 09/01/2007 Exit[employer] Patient Information 2012 [employer]. 

## 2012-02-20 NOTE — Progress Notes (Signed)
Continues to be very drowsy and sleeping. Husband with patient.

## 2012-02-20 NOTE — Progress Notes (Signed)
Pt became nauseated when up to BR . Back to bed

## 2012-02-20 NOTE — Progress Notes (Signed)
Complains with itching.no rash noted. Dr. Vernie Ammons paged

## 2012-02-20 NOTE — H&P (Signed)
History of Present Illness             Nephrolithiasis: A CT scan done on 01/14/12 revealed a 6 mm stone located at the left UVJ. Hounsfield units were measured at  635. There were 3 nonobstructing stones in the lower pole of the left kidney the largest being 5 mm and a single 3 mm stones in the upper pole of the right kidney.     Past Medical History Problems  1. History of  Diabetes During Pregnancy 2. History of  Esophageal Reflux 530.81 3. History of  Functional Murmur 785.2 4. History of  Ovarian Cyst 620.2 5. History of  Trigeminal Neuralgia 350.1  Surgical History Problems  1. History of  Cesarean Section  Current Meds 1. Gabapentin 600 MG Oral Tablet; Therapy: (Recorded:23May2013) to 2. HYDROmorphone HCl 4 MG Oral Tablet; 1 - 2 tabs q 4 - 6 Hrs. PRN pain; Therapy: 23May2013 to  (Last Rx:23May2013) 3. Sprix 15.75 MG/SPRAY Nasal Solution; One spray in each nostril every 6-8 hours; Therapy:  23May2013 to (Last Rx:23May2013)  Requested for: 23May2013 4. Tamsulosin HCl 0.4 MG Oral Capsule; Take one tablet daily at bedtime; Therapy: 23May2013 to  (Evaluate:22Jun2013)  Requested for: 23May2013; Last Rx:23May2013 5. Zofran 4 MG Oral Tablet; Therapy: (Recorded:23May2013) to  Allergies Medication  1. OxyCODONE HCl TABS Non-Medication  2. Shellfish  Family History Problems  1. Family history of  Alcohol Abuse 2. Family history of  Arthritis V17.7 3. Family history of  Breast Cancer V16.3 4. Family history of  Colon Cancer V16.0 5. Family history of  Coronary Artery Disease V17.49 6. Family history of  Depression 7. Family history of  Diabetes Mellitus V18.0 8. Family history of  Hyperlipidemia 9. Family history of  Hypertension V17.49 10. Family history of  Prostate Cancer V16.42 11. Family history of  Stroke Syndrome V17.1  Social History Problems  1. Caffeine Use 2. Marital History - Currently Married 3. Never A Smoker Denied  4. History of  Alcohol Use Review  of Systems Genitourinary, constitutional, skin, eye, otolaryngeal, hematologic/lymphatic, cardiovascular, pulmonary, endocrine, musculoskeletal, gastrointestinal, neurological and psychiatric system(s) were reviewed and pertinent findings if present are noted.  Genitourinary: incontinence.  Gastrointestinal: nausea, vomiting, abdominal pain, heartburn and diarrhea.  Constitutional: night sweats, feeling tired (fatigue) and recent weight loss.    Vitals Vital Signs  BMI Calculated: 24.43 BSA Calculated: 1.78 Height: 5 ft 6 in Weight: 152 lb  Blood Pressure: 152 / 88 Heart Rate: 63  Physical Exam Constitutional: Well nourished and well developed . No acute distress.  ENT:. The ears and nose are normal in appearance.  Neck: The appearance of the neck is normal and no neck mass is present.  Pulmonary: No respiratory distress and normal respiratory rhythm and effort.  Cardiovascular: Heart rate and rhythm are normal . No peripheral edema.  Abdomen: The abdomen is soft and nontender. No masses are palpated. No CVA tenderness. No hernias are palpable. No hepatosplenomegaly noted.  Lymphatics: The femoral and inguinal nodes are not enlarged or tender.  Skin: Normal skin turgor, no visible rash and no visible skin lesions.  Neuro/Psych:. Mood and affect are appropriate.   Assessment Assessed  1. Distal Ureteral Stone On The Left 592.1 2. Nephrolithiasis Of Both Kidneys 592.0   I went over the results of her KUB with her which is revealed the stone has not progressed. It remains in the distal left ureter. Is easily visualized. We therefore discussed treatment options. Because of failure to progress  I recommended either lithotripsy or ureteroscopy. I have discussed each of these procedures in complete detail. We also discussed the risks and complications as well as positives and negatives of each of these forms of therapy as well as the probability of success. She understands each of these and  has elected to proceed with lithotripsy at this time.   Plan    She is scheduled for lithotripsy of her left distal ureteral stone.

## 2012-02-20 NOTE — Op Note (Signed)
See [facility] OP note scanned into chart. 

## 2012-02-20 NOTE — Interval H&P Note (Signed)
History and Physical Interval Note:  02/20/2012 10:37 AM  Cynthia Roach  has presented today for surgery, with the diagnosis of Left Ureteral Stone  The various methods of treatment have been discussed with the patient and family. After consideration of risks, benefits and other options for treatment, the patient has consented to  Procedure(s) (LRB): EXTRACORPOREAL SHOCK WAVE LITHOTRIPSY (ESWL) (Left) as a surgical intervention .  The patient's history has been reviewed, patient examined, no change in status, stable for surgery.  I have reviewed the patients' chart and labs.  Questions were answered to the patient's satisfaction.     [place]

## 2012-02-20 NOTE — Progress Notes (Signed)
Bruised area left flank

## 2012-02-21 ENCOUNTER — Encounter (HOSPITAL_COMMUNITY): Payer: Self-pay

## 2012-12-07 ENCOUNTER — Emergency Department (HOSPITAL_BASED_OUTPATIENT_CLINIC_OR_DEPARTMENT_OTHER)
Admission: EM | Admit: 2012-12-07 | Discharge: 2012-12-07 | Disposition: A | Discharge status: Home / Self Care | Payer: Managed Care, Other (non HMO) | Attending: Emergency Medicine | Admitting: Emergency Medicine

## 2012-12-07 ENCOUNTER — Encounter (HOSPITAL_BASED_OUTPATIENT_CLINIC_OR_DEPARTMENT_OTHER): Payer: Self-pay | Admitting: *Deleted

## 2012-12-07 DIAGNOSIS — Z87440 Personal history of urinary (tract) infections: Secondary | ICD-10-CM | POA: Insufficient documentation

## 2012-12-07 DIAGNOSIS — I10 Essential (primary) hypertension: Secondary | ICD-10-CM

## 2012-12-07 DIAGNOSIS — H538 Other visual disturbances: Secondary | ICD-10-CM | POA: Insufficient documentation

## 2012-12-07 DIAGNOSIS — Z8742 Personal history of other diseases of the female genital tract: Secondary | ICD-10-CM | POA: Insufficient documentation

## 2012-12-07 DIAGNOSIS — N189 Chronic kidney disease, unspecified: Secondary | ICD-10-CM | POA: Insufficient documentation

## 2012-12-07 DIAGNOSIS — I129 Hypertensive chronic kidney disease with stage 1 through stage 4 chronic kidney disease, or unspecified chronic kidney disease: Secondary | ICD-10-CM | POA: Insufficient documentation

## 2012-12-07 DIAGNOSIS — Z8669 Personal history of other diseases of the nervous system and sense organs: Secondary | ICD-10-CM | POA: Insufficient documentation

## 2012-12-07 DIAGNOSIS — R51 Headache: Secondary | ICD-10-CM | POA: Insufficient documentation

## 2012-12-07 LAB — BASIC METABOLIC PANEL
BUN: 8 mg/dL (ref 6–23)
Chloride: 102 mEq/L (ref 96–112)
Creatinine, Ser: 1.1 mg/dL (ref 0.50–1.10)
GFR calc Af Amer: 72 mL/min — ABNORMAL LOW (ref >90)
GFR calc non Af Amer: 62 mL/min — ABNORMAL LOW (ref >90)
Potassium: 3.7 mEq/L (ref 3.5–5.1)

## 2012-12-07 MED ORDER — HYDROCHLOROTHIAZIDE 25 MG PO TABS: 25.0000 mg | ORAL_TABLET | Freq: Every day | ORAL | Status: DC

## 2012-12-07 MED ORDER — PENICILLIN G BENZATHINE 1200000 UNIT/2ML IM SUSP: 1.2000 10*6.[IU] | Freq: Once | INTRAMUSCULAR | Status: DC

## 2012-12-07 NOTE — ED Notes (Addendum)
Per patient, she was at [facility] research for acne evaluation and when they checked her vitals her b/p was 184/104 and 176/114 in the right arm and 177/110 in the right arm. No hx of high blood pressure.  [facility] said she needed to have this evaluated. Pt states that she has a headache and has noticed some blurry vision

## 2012-12-07 NOTE — ED Provider Notes (Signed)
History  This chart was scribed for Bailen Geffre B. Bernette Mayers, MD by Ardeen Jourdain, ED Scribe. This patient was seen in room MH08/MH08 and the patient's care was started at 1835.  CSN: [id number]  Arrival date & time 12/07/12  1802   First MD Initiated Contact with Patient 12/07/12 1835      Chief Complaint  Patient presents with  . Hypertension     The history is provided by the patient. No language interpreter was used.    Cynthia Roach is a 42 y.o. female who presents to the Emergency Department complaining of HTN that was measured this morning. Pt states she has blurred vision and a minor HA currently. She denies any previous similar symptoms. Pt states she was trying to enroll in a clinical trial but was unable to because of the high blood pressure. Pt denies fever, neck pain, sore throat, visual disturbance, CP, cough, SOB, abdominal pain, nausea, emesis, diarrhea, urinary symptoms, back pain, weakness, numbness and rash as associated symptoms. Pt states her BP was measured last when she had her lithotripsy performed.    Past Medical History  Diagnosis Date  . Seasonal allergies   . Ovarian cyst   . Trigeminal neuralgia   . Chronic kidney disease     left ureteral stone    Past Surgical History  Procedure Laterality Date  . Cesarean section      Family History  Problem Relation Age of Onset  . Alcohol abuse    . Arthritis    . Breast cancer    . Colon cancer    . Diabetes    . Hyperlipidemia    . Hypertension    . Prostate cancer    . Depression    . Stroke    . Sudden death    . Coronary artery disease      History  Substance Use Topics  . Smoking status: Never Smoker   . Smokeless tobacco: Not on file  . Alcohol Use: No   No OB history available.   Review of Systems  A complete 10 system review of systems was obtained and all systems are negative except as noted in the HPI and PMH.   Allergies  Oxycodone and Shellfish allergy  Home  Medications   Current Outpatient Rx  Name  Route  Sig  Dispense  Refill  . HYDROmorphone (DILAUDID) 4 MG tablet   Oral   Take 4 mg by mouth every 4 (four) hours as needed.         Marland Kitchen ibuprofen (ADVIL,MOTRIN) 200 MG tablet   Oral   Take 400 mg by mouth every 6 (six) hours as needed. pain         . Ketorolac Tromethamine (SPRIX) 15.75 MG/SPRAY SOLN   Nasal   Place 1 spray into the nose.         . ondansetron (ZOFRAN) 4 MG tablet   Oral   Take 4 mg by mouth every 8 (eight) hours as needed.         . Tamsulosin HCl (FLOMAX) 0.4 MG CAPS   Oral   Take 0.4 mg by mouth daily.           Triage Vitals: BP 157/107  Pulse 73  Temp(Src) 98.5 F (36.9 C) (Oral)  Resp 20  SpO2 100%  Physical Exam  Nursing note and vitals reviewed. Constitutional: She is oriented to person, place, and time. She appears well-developed and well-nourished. No distress.  HENT:  Head: Normocephalic  and atraumatic.  Eyes: EOM are normal. Pupils are equal, round, and reactive to light.  Neck: Normal range of motion. Neck supple.  Cardiovascular: Normal rate, regular rhythm, normal heart sounds and intact distal pulses.  Exam reveals no gallop and no friction rub.   No murmur heard. Pulmonary/Chest: Effort normal and breath sounds normal. No respiratory distress. She has no wheezes. She has no rales. She exhibits no tenderness.  Abdominal: Soft. Bowel sounds are normal. She exhibits no distension. There is no tenderness.  Musculoskeletal: Normal range of motion. She exhibits no edema and no tenderness.  Neurological: She is alert and oriented to person, place, and time. She has normal strength. No cranial nerve deficit or sensory deficit.  Skin: Skin is warm and dry. No rash noted. She is not diaphoretic.  Psychiatric: She has a normal mood and affect. Her behavior is normal.    ED Course  Procedures (including critical care time)  DIAGNOSTIC STUDIES: Oxygen Saturation is 100% on room air,  normal by my interpretation.    COORDINATION OF CARE:  6:40 PM-Discussed treatment plan which includes BMP and EKG with pt at bedside and pt agreed to plan.    Labs Reviewed  BASIC METABOLIC PANEL - Abnormal; Notable for the following:    GFR calc non Af Amer 62 (*)    GFR calc Af Amer 72 (*)    All other components within normal limits   No results found.   1. Hypertension       MDM   Date: 12/07/2012  Rate: 68  Rhythm: normal sinus rhythm  QRS Axis: normal  Intervals: normal  ST/T Wave abnormalities: normal  Conduction Disutrbances:none  Narrative Interpretation: LVH  Old EKG Reviewed: none available   .7:50 PM Pt with normal BMP, mild LVH on EKG. Will start HCTZ, close PCP followup. No significant end organ damage.      I personally performed the services described in this documentation, which was scribed in my presence. The recorded information has been reviewed and is accurate.     Kareli Hossain B. Bernette Mayers, MD 12/07/12 [phone]

## 2012-12-08 ENCOUNTER — Ambulatory Visit (INDEPENDENT_AMBULATORY_CARE_PROVIDER_SITE_OTHER): Payer: Managed Care, Other (non HMO) | Admitting: Family Medicine

## 2012-12-08 ENCOUNTER — Encounter: Payer: Self-pay | Admitting: Family Medicine

## 2012-12-08 VITALS — BP 145/100 | HR 98 | Temp 98.1°F | Wt 169.0 lb

## 2012-12-08 DIAGNOSIS — I10 Essential (primary) hypertension: Secondary | ICD-10-CM

## 2012-12-08 DIAGNOSIS — J329 Chronic sinusitis, unspecified: Secondary | ICD-10-CM

## 2012-12-08 MED ORDER — AMOXICILLIN 875 MG PO TABS: 875.0000 mg | ORAL_TABLET | Freq: Two times a day (BID) | ORAL | Status: DC

## 2012-12-08 NOTE — Patient Instructions (Signed)
-  As we discussed, we have prescribed a new medication AMOXICILLIN for the sinus infection for you at this appointment. We discussed the common and serious potential adverse effects of this medication and you can review these and more with the pharmacist when you pick up your medication.  Please follow the instructions for use carefully and notify us immediately if you have any problems taking this medication.  -Take HCTZ every day and get regular exercise and eat a healthy low sodium diet  -follow up with your doctor in 2-3 weeks for rechecking blood pressure

## 2012-12-08 NOTE — Progress Notes (Signed)
Chief Complaint  Patient presents with  . Hypertension    headaches and blurred vision; went to er yesterday     HPI:  Acute visit for elevated BP: -was in [place] yesterday for derm appointment but had elevated blood pressure in the 170-180s/100s - started on HCTZ today -did some labs as well in the urgent care here and had EKG - EKG showed chronic changes from BP, told labs looked good and kidney function looked good per her report.  -she thinks she probably had elevated BP for a long time - she does not go to the doctor much -Parents have high BP -she did not feel well when the BP was elevated - had had and vision blurred, has eye doctor - wears contacts - she has had a lot of sinus issues, nasal congestion, sinus pain for two weeks- take claritin for allergies but not helping has hx sinus infections -denies CP, SOB, CP, palpitations, swelling    ROS: See pertinent positives and negatives per HPI.  Past Medical History  Diagnosis Date  . Seasonal allergies   . Ovarian cyst   . Trigeminal neuralgia   . Chronic kidney disease     left ureteral stone    Family History  Problem Relation Age of Onset  . Alcohol abuse    . Arthritis    . Breast cancer    . Colon cancer    . Diabetes    . Hyperlipidemia    . Hypertension    . Prostate cancer    . Depression    . Stroke    . Sudden death    . Coronary artery disease      History   Social History  . Marital Status: Married    Spouse Name: N/A    Number of Children: N/A  . Years of Education: N/A   Social History Main Topics  . Smoking status: Never Smoker   . Smokeless tobacco: None  . Alcohol Use: No  . Drug Use: No  . Sexually Active: None   Other Topics Concern  . None   Social History Narrative  . None    Current outpatient prescriptions:hydrochlorothiazide (HYDRODIURIL) 25 MG tablet, Take 1 tablet (25 mg total) by mouth daily., Disp: 30 tablet, Rfl: 0;  amoxicillin (AMOXIL) 875 MG tablet, Take 1  tablet (875 mg total) by mouth 2 (two) times daily., Disp: 20 tablet, Rfl: 0  EXAM:  Filed Vitals:   12/08/12 0922  BP: 145/100  Pulse: 98  Temp: 98.1 F (36.7 C)    Body mass index is 27.29 kg/(m^2).  GENERAL: vitals reviewed and listed above, alert, oriented, appears well hydrated and in no acute distress  HEENT: atraumatic, conjunttiva clear, no obvious abnormalities on inspection of external nose and ears, normal appearance of ear canals and TMs, white nasal congestion, mild post oropharyngeal erythema with PND, no tonsillar edema or exudate, R max sinus TTP  NECK: no obvious masses on inspection  LUNGS: clear to auscultation bilaterally, no wheezes, rales or rhonchi, good air movement  CV: HRRR, no peripheral edema  MS: moves all extremities without noticeable abnormality  PSYCH: pleasant and cooperative, no obvious depression or anxiety  ASSESSMENT AND PLAN:  Discussed the following assessment and plan:  HYPERTENSION - Plan: amoxicillin (AMOXIL) 875 MG tablet  Sinusitis - Plan: amoxicillin (AMOXIL) 875 MG tablet  -it seems BP is likely responding to the HCTZ well as much lower today after sitting then yesterday and she is feeling better  other then sinus issues. Discussed that sometimes need several medications to control BP. Reviewed BMP - Cr. Improved from 10 months ago. Follow up with PCP in 2-3 weeks to recheck BP. -tx with amox for sinus infection - discussed risks. -Patient advised to return or notify a doctor immediately if symptoms worsen or persist or new concerns arise.  There are no Patient Instructions on file for this visit.   Kriste Basque R.

## 2012-12-22 ENCOUNTER — Ambulatory Visit (INDEPENDENT_AMBULATORY_CARE_PROVIDER_SITE_OTHER): Payer: Managed Care, Other (non HMO) | Admitting: Family Medicine

## 2012-12-22 ENCOUNTER — Encounter: Payer: Self-pay | Admitting: Family Medicine

## 2012-12-22 VITALS — BP 144/100 | HR 90 | Temp 98.1°F | Wt 172.0 lb

## 2012-12-22 DIAGNOSIS — I10 Essential (primary) hypertension: Secondary | ICD-10-CM

## 2012-12-22 MED ORDER — LISINOPRIL-HYDROCHLOROTHIAZIDE 20-25 MG PO TABS: 1.0000 | ORAL_TABLET | Freq: Every day | ORAL | Status: DC

## 2012-12-22 NOTE — Progress Notes (Signed)
  Subjective:    Patient ID: Cynthia Roach, female    DOB: [birth year], 42 y.o.   MRN: [id number]  HPI Here to follow up on HTN. She had been in the ER a few weeks ago and had unremarkable labs and an EKG. She was seen here a few weeks ago and started on HCTZ  Daily. She feels fine but the BP remains high. She does not use tobacco and she watches her sodium intake.    Review of Systems  Constitutional: Negative.   Respiratory: Negative.   Cardiovascular: Negative.        Objective:   Physical Exam  Constitutional: She appears well-developed and well-nourished.  Cardiovascular: Normal rate, regular rhythm, normal heart sounds and intact distal pulses.   Pulmonary/Chest: Effort normal and breath sounds normal.  Musculoskeletal: She exhibits no edema.          Assessment & Plan:  We will change to Lisinopril HCT daily and recheck in 2 weeks

## 2013-01-05 ENCOUNTER — Ambulatory Visit (INDEPENDENT_AMBULATORY_CARE_PROVIDER_SITE_OTHER): Payer: Managed Care, Other (non HMO) | Admitting: Family Medicine

## 2013-01-05 ENCOUNTER — Encounter: Payer: Self-pay | Admitting: Family Medicine

## 2013-01-05 VITALS — BP 148/102 | HR 67 | Temp 97.7°F | Wt 171.0 lb

## 2013-01-05 DIAGNOSIS — I10 Essential (primary) hypertension: Secondary | ICD-10-CM

## 2013-01-05 MED ORDER — AMLODIPINE BESYLATE 5 MG PO TABS: 5.0000 mg | ORAL_TABLET | Freq: Every day | ORAL | Status: DC

## 2013-01-05 NOTE — Progress Notes (Signed)
  Subjective:    Patient ID: Cynthia Roach, female    DOB: April 29, 1971, 42 y.o.   MRN: [id number]  HPI Here to follow up on HTN. She has been taking Lisinopril HCT and she feels fine, however her BP has not changed at all.    Review of Systems  Constitutional: Negative.   Respiratory: Negative.   Cardiovascular: Negative.        Objective:   Physical Exam  Constitutional: She appears well-developed and well-nourished.  Cardiovascular: Normal rate, regular rhythm, normal heart sounds and intact distal pulses.   Pulmonary/Chest: Effort normal and breath sounds normal.  Musculoskeletal: She exhibits no edema.          Assessment & Plan:  Add Amlodipine 5 mg daily. Recheck in 3 weeks

## 2013-01-25 ENCOUNTER — Telehealth: Payer: Self-pay | Admitting: Family Medicine

## 2013-01-25 NOTE — Telephone Encounter (Signed)
Pt states BP med she was started on 23mo ago does not seem to be working. Bp reading have been around 142/91. Bottom # never gets below 76. Also, it's giving her a chronic cough. Wants to know if she needs OV, or if Dr Clent Ridges can change BP med. Please advise.  Pt uses [facility].

## 2013-01-26 NOTE — Telephone Encounter (Signed)
I spoke with pt and she is going to schedule a office visit. 

## 2013-01-26 NOTE — Telephone Encounter (Signed)
She needs an OV for this  

## 2013-01-29 ENCOUNTER — Ambulatory Visit (INDEPENDENT_AMBULATORY_CARE_PROVIDER_SITE_OTHER): Payer: Managed Care, Other (non HMO) | Admitting: Family Medicine

## 2013-01-29 ENCOUNTER — Encounter: Payer: Self-pay | Admitting: Family Medicine

## 2013-01-29 VITALS — BP 130/82 | HR 102 | Temp 98.1°F | Wt 167.0 lb

## 2013-01-29 DIAGNOSIS — I10 Essential (primary) hypertension: Secondary | ICD-10-CM

## 2013-01-29 DIAGNOSIS — R221 Localized swelling, mass and lump, neck: Secondary | ICD-10-CM

## 2013-01-29 LAB — CBC WITH DIFFERENTIAL/PLATELET
Basophils Absolute: 0 10*3/uL (ref 0.0–0.1)
Basophils Relative: 1 % (ref 0–1)
HCT: 35.4 % — ABNORMAL LOW (ref 36.0–46.0)
Lymphocytes Relative: 29 % (ref 12–46)
MCHC: 34.2 g/dL (ref 30.0–36.0)
Monocytes Absolute: 0.7 10*3/uL (ref 0.1–1.0)
Neutro Abs: 5 10*3/uL (ref 1.7–7.7)
Neutrophils Relative %: 59 % (ref 43–77)
RDW: 12.9 % (ref 11.5–15.5)
WBC: 8.4 10*3/uL (ref 4.0–10.5)

## 2013-01-29 MED ORDER — LOSARTAN POTASSIUM-HCTZ 100-25 MG PO TABS: 1.0000 | ORAL_TABLET | Freq: Every day | ORAL | Status: DC

## 2013-01-29 NOTE — Progress Notes (Signed)
  Subjective:    Patient ID: Cynthia Roach, female    DOB: [birth year], 42 y.o.   MRN: [id number]  HPI Here to follow up her BP and with a new complaint. She has been on Lisinopril HCT and Amlodipine, and her BP has come down nicely. However she has developed a dry tickling cough. Also she tells me that she has had some mild discomfort in the right neck for several months, but this area has become more swollen in the past 2 weeks. She has a goiter which we have been following for years. Her last neck US was in 2010.   Review of Systems  Constitutional: Negative.   HENT: Positive for neck pain.   Respiratory: Negative for chest tightness, shortness of breath and wheezing.   Cardiovascular: Negative.        Objective:   Physical Exam  Constitutional: She appears well-developed and well-nourished.  HENT:  Mouth/Throat: Oropharynx is clear and moist. No oropharyngeal exudate.  Neck: Normal range of motion. Neck supple.  She has her usual bilateral thyroid fullness, but now she also has a tender mass on the right neck just above the level of the thyroid  Cardiovascular: Normal rate, regular rhythm, normal heart sounds and intact distal pulses.   Pulmonary/Chest: Effort normal and breath sounds normal.          Assessment & Plan:  She seems to have a cough from the Lisinopril so we will stop this and switch to Losartan HCT. The neck mass seems to be a lymph node but we will evaluate with a CBC and an [place] soon.

## 2013-02-01 NOTE — Progress Notes (Signed)
Quick Note:  I released results in my chart. ______ 

## 2013-02-05 ENCOUNTER — Ambulatory Visit
Admission: RE | Admit: 2013-02-05 | Discharge: 2013-02-05 | Disposition: A | Discharge status: Home / Self Care | Payer: Managed Care, Other (non HMO) | Source: Ambulatory Visit | Attending: Family Medicine | Admitting: Family Medicine

## 2013-02-05 DIAGNOSIS — R221 Localized swelling, mass and lump, neck: Secondary | ICD-10-CM

## 2013-02-05 NOTE — Progress Notes (Signed)
Quick Note:  I released results in my chart. ______ 

## 2013-02-25 ENCOUNTER — Ambulatory Visit (INDEPENDENT_AMBULATORY_CARE_PROVIDER_SITE_OTHER)
Admission: RE | Admit: 2013-02-25 | Discharge: 2013-02-25 | Disposition: A | Discharge status: Home / Self Care | Payer: Managed Care, Other (non HMO) | Source: Ambulatory Visit | Attending: Family Medicine | Admitting: Family Medicine

## 2013-02-25 ENCOUNTER — Ambulatory Visit (INDEPENDENT_AMBULATORY_CARE_PROVIDER_SITE_OTHER): Payer: Managed Care, Other (non HMO) | Admitting: Family Medicine

## 2013-02-25 ENCOUNTER — Encounter: Payer: Self-pay | Admitting: Family Medicine

## 2013-02-25 VITALS — BP 132/84 | HR 100 | Temp 98.2°F | Wt 174.0 lb

## 2013-02-25 DIAGNOSIS — R059 Cough, unspecified: Secondary | ICD-10-CM

## 2013-02-25 DIAGNOSIS — R05 Cough: Secondary | ICD-10-CM

## 2013-02-25 DIAGNOSIS — R599 Enlarged lymph nodes, unspecified: Secondary | ICD-10-CM

## 2013-02-25 DIAGNOSIS — R591 Generalized enlarged lymph nodes: Secondary | ICD-10-CM

## 2013-02-25 MED ORDER — OMEPRAZOLE 40 MG PO CPDR: 40.0000 mg | DELAYED_RELEASE_CAPSULE | Freq: Every day | ORAL | Status: DC

## 2013-02-25 NOTE — Progress Notes (Signed)
  Subjective:    Patient ID: Cynthia Roach, female    DOB: 08-05-1971, 42 y.o.   MRN: [id number]  HPI Here for 2 things. First she still has a chronic dry cough which started about 3 months ago. One month ago we switched her from Lisinopril to Losartan in case this was an ACE inhibitor cough. Unfortunately this made no difference at all. Her BP is stable but the cough persists. No SOB or chest pain. Also we had checked a tender area of fullness in the right neck. An US revealed this to be an enlarged lymph node and her thyroid looked normal.    Review of Systems  Constitutional: Negative.   HENT: Negative for sore throat, trouble swallowing and sinus pressure.   Respiratory: Positive for cough. Negative for chest tightness, shortness of breath and wheezing.   Cardiovascular: Negative.   Hematological: Positive for adenopathy.       Objective:   Physical Exam  Constitutional: She appears well-developed and well-nourished.  Coughing frequently  Neck: Neck supple. No thyromegaly present.  Mild fullness and tenderness in the right anterior neck           Assessment & Plan:  As for the neck node, we will refer her to ENT. As for the cough, we will get a CXR today. She will try Omeprazole in case she has silent reflux.

## 2013-03-01 NOTE — Progress Notes (Signed)
Quick Note:  I released results in my chart and left a voice message for pt. ______ 

## 2013-05-11 ENCOUNTER — Ambulatory Visit (INDEPENDENT_AMBULATORY_CARE_PROVIDER_SITE_OTHER): Payer: Managed Care, Other (non HMO) | Admitting: Family Medicine

## 2013-05-11 ENCOUNTER — Encounter: Payer: Self-pay | Admitting: Family Medicine

## 2013-05-11 VITALS — BP 112/74 | HR 91 | Temp 98.1°F | Wt 175.0 lb

## 2013-05-11 DIAGNOSIS — G500 Trigeminal neuralgia: Secondary | ICD-10-CM

## 2013-05-11 MED ORDER — GABAPENTIN 300 MG PO CAPS: 300.0000 mg | ORAL_CAPSULE | Freq: Three times a day (TID) | ORAL | Status: DC

## 2013-05-11 MED ORDER — METHYLPREDNISOLONE ACETATE 80 MG/ML IJ SUSP
120.0000 mg | Freq: Once | INTRAMUSCULAR | Status: AC
  Administered 2013-05-11: 120 mg via INTRAMUSCULAR

## 2013-05-11 MED ORDER — EPINEPHRINE 0.3 MG/0.3ML IJ SOAJ: 0.3000 mg | Freq: Once | INTRAMUSCULAR | Status: DC

## 2013-05-11 NOTE — Progress Notes (Signed)
  Subjective:    Patient ID: Cynthia Roach, female    DOB: 1971-04-09, 42 y.o.   MRN: [id number]  HPI Here for 2 weeks of neuralgia pain on the right side of her face. She has had intermittent mild bouts of this for 2 years, but this is one of the worst. No other neurologic deficits. Taking Gabapentin 300 mg bid.    Review of Systems  Constitutional: Negative.   Eyes: Negative.   Neurological: Positive for headaches. Negative for dizziness, tremors, seizures, syncope, facial asymmetry, speech difficulty, weakness, light-headedness and numbness.       Objective:   Physical Exam  Constitutional: She is oriented to person, place, and time. She appears well-developed and well-nourished.  HENT:  Head: Normocephalic and atraumatic.  Right Ear: External ear normal.  Left Ear: External ear normal.  Nose: Nose normal.  Mouth/Throat: Oropharynx is clear and moist.  Eyes: Conjunctivae and EOM are normal. Pupils are equal, round, and reactive to light.  Neck: Neck supple. No thyromegaly present.  Neurological: She is alert and oriented to person, place, and time. No cranial nerve deficit.  Very tender to light touch over the right forehead and cheek           Assessment & Plan:  Given a steroid shot. Increase Gabapentin to 300 mg tid. Out of work today and tomorrow

## 2013-05-11 NOTE — Addendum Note (Signed)
Addended by: Aniceto Boss A on: 05/11/2013 11:41 AM   Modules accepted: Orders

## 2013-05-26 ENCOUNTER — Other Ambulatory Visit: Payer: Self-pay | Admitting: Family Medicine

## 2013-05-26 NOTE — Telephone Encounter (Signed)
Can we refill these? 

## 2013-05-27 NOTE — Telephone Encounter (Signed)
Refill both for one year. 

## 2013-09-29 ENCOUNTER — Telehealth: Payer: Self-pay | Admitting: Family Medicine

## 2013-09-29 NOTE — Telephone Encounter (Signed)
Pt has paper work that needs to be filled out, we have it here at office. I left voice message for pt to schedule a office visit to discuss filling out this paperwork. Can you try to call pt again and get her on the schedule?

## 2013-09-29 NOTE — Telephone Encounter (Signed)
done

## 2013-10-04 ENCOUNTER — Encounter: Payer: Self-pay | Admitting: Family Medicine

## 2013-10-04 ENCOUNTER — Ambulatory Visit (INDEPENDENT_AMBULATORY_CARE_PROVIDER_SITE_OTHER): Payer: Managed Care, Other (non HMO) | Admitting: Family Medicine

## 2013-10-04 VITALS — BP 130/80 | HR 77 | Temp 98.2°F | Ht 66.0 in | Wt 178.0 lb

## 2013-10-04 DIAGNOSIS — G500 Trigeminal neuralgia: Secondary | ICD-10-CM

## 2013-10-04 MED ORDER — METHYLPREDNISOLONE ACETATE 80 MG/ML IJ SUSP
120.0000 mg | Freq: Once | INTRAMUSCULAR | Status: AC
  Administered 2013-10-04: 120 mg via INTRAMUSCULAR

## 2013-10-04 NOTE — Progress Notes (Signed)
Pre visit review using our clinic review tool, if applicable. No additional management support is needed unless otherwise documented below in the visit note. 

## 2013-10-04 NOTE — Progress Notes (Signed)
   Subjective:    Patient ID: Cynthia Roach, female    DOB: [birth year], 43 y.o.   MRN: [id number]  HPI Here for another flare up of trigeminal neuralgia over the right side of the face. This started about 2 weeks ago, and it waxes and wanes. She takes gabapentin as usual. She has tried Ibuprofen with no benefit.    Review of Systems  Constitutional: Negative.   Neurological: Positive for headaches.       Objective:   Physical Exam  Constitutional: She is oriented to person, place, and time. She appears well-developed and well-nourished.  In mild pain   HENT:  Head: Normocephalic and atraumatic.  Right Ear: External ear normal.  Left Ear: External ear normal.  Nose: Nose normal.  Mouth/Throat: Oropharynx is clear and moist.  Eyes: Conjunctivae and EOM are normal. Pupils are equal, round, and reactive to light.  Neck: No thyromegaly present.  Lymphadenopathy:    She has no cervical adenopathy.  Neurological: She is alert and oriented to person, place, and time. No cranial nerve deficit.          Assessment & Plan:  Given a steroid shot. Written out of work from 09-18-13 to 09-25-13.

## 2013-10-19 ENCOUNTER — Emergency Department (HOSPITAL_BASED_OUTPATIENT_CLINIC_OR_DEPARTMENT_OTHER): Payer: Managed Care, Other (non HMO)

## 2013-10-19 ENCOUNTER — Emergency Department (HOSPITAL_BASED_OUTPATIENT_CLINIC_OR_DEPARTMENT_OTHER)
Admission: EM | Admit: 2013-10-19 | Discharge: 2013-10-19 | Disposition: A | Discharge status: Home / Self Care | Payer: Managed Care, Other (non HMO) | Attending: Emergency Medicine | Admitting: Emergency Medicine

## 2013-10-19 ENCOUNTER — Encounter (HOSPITAL_BASED_OUTPATIENT_CLINIC_OR_DEPARTMENT_OTHER): Payer: Self-pay | Admitting: Emergency Medicine

## 2013-10-19 DIAGNOSIS — N200 Calculus of kidney: Secondary | ICD-10-CM | POA: Insufficient documentation

## 2013-10-19 DIAGNOSIS — Z8742 Personal history of other diseases of the female genital tract: Secondary | ICD-10-CM | POA: Insufficient documentation

## 2013-10-19 DIAGNOSIS — Z3202 Encounter for pregnancy test, result negative: Secondary | ICD-10-CM | POA: Insufficient documentation

## 2013-10-19 DIAGNOSIS — N189 Chronic kidney disease, unspecified: Secondary | ICD-10-CM | POA: Insufficient documentation

## 2013-10-19 DIAGNOSIS — Z79899 Other long term (current) drug therapy: Secondary | ICD-10-CM | POA: Insufficient documentation

## 2013-10-19 DIAGNOSIS — G500 Trigeminal neuralgia: Secondary | ICD-10-CM | POA: Insufficient documentation

## 2013-10-19 DIAGNOSIS — I129 Hypertensive chronic kidney disease with stage 1 through stage 4 chronic kidney disease, or unspecified chronic kidney disease: Secondary | ICD-10-CM | POA: Insufficient documentation

## 2013-10-19 LAB — URINALYSIS, ROUTINE W REFLEX MICROSCOPIC
Bilirubin Urine: NEGATIVE
Glucose, UA: NEGATIVE mg/dL
Ketones, ur: NEGATIVE mg/dL
Leukocytes, UA: NEGATIVE
NITRITE: NEGATIVE
Protein, ur: NEGATIVE mg/dL
SPECIFIC GRAVITY, URINE: 1.021 (ref 1.005–1.030)
Urobilinogen, UA: 0.2 mg/dL (ref 0.0–1.0)
pH: 5.5 (ref 5.0–8.0)

## 2013-10-19 LAB — URINE MICROSCOPIC-ADD ON

## 2013-10-19 LAB — PREGNANCY, URINE: PREG TEST UR: NEGATIVE

## 2013-10-19 MED ORDER — TAMSULOSIN HCL 0.4 MG PO CAPS: 0.4000 mg | ORAL_CAPSULE | Freq: Every day | ORAL | Status: DC

## 2013-10-19 MED ORDER — KETOROLAC TROMETHAMINE 30 MG/ML IJ SOLN
30.0000 mg | Freq: Once | INTRAMUSCULAR | Status: AC
  Administered 2013-10-19: 30 mg via INTRAVENOUS
  Filled 2013-10-19: qty 1

## 2013-10-19 MED ORDER — HYDROMORPHONE HCL PF 1 MG/ML IJ SOLN
1.0000 mg | Freq: Once | INTRAMUSCULAR | Status: AC
  Administered 2013-10-19: 1 mg via INTRAVENOUS
  Filled 2013-10-19: qty 1

## 2013-10-19 MED ORDER — SODIUM CHLORIDE 0.9 % IV SOLN
Freq: Once | INTRAVENOUS | Status: AC
  Administered 2013-10-19: 21:00:00 via INTRAVENOUS

## 2013-10-19 MED ORDER — ONDANSETRON HCL 4 MG/2ML IJ SOLN
4.0000 mg | Freq: Once | INTRAMUSCULAR | Status: AC
  Administered 2013-10-19: 4 mg via INTRAMUSCULAR
  Filled 2013-10-19: qty 2

## 2013-10-19 MED ORDER — HYDROMORPHONE HCL 2 MG PO TABS: 2.0000 mg | ORAL_TABLET | ORAL | Status: DC | PRN

## 2013-10-19 MED ORDER — ONDANSETRON 4 MG PO TBDP: 4.0000 mg | ORAL_TABLET | Freq: Three times a day (TID) | ORAL | Status: DC | PRN

## 2013-10-19 NOTE — ED Notes (Signed)
Pt c/o LLQ pain x5 days with severe pain since 9am today with vomiting x2 today

## 2013-10-19 NOTE — Discharge Instructions (Signed)
Kidney Stones  Kidney stones (urolithiasis) are deposits that form inside your kidneys. The intense pain is caused by the stone moving through the urinary tract. When the stone moves, the ureter goes into spasm around the stone. The stone is usually passed in the urine.   CAUSES   · A disorder that makes certain neck glands produce too much parathyroid hormone (primary hyperparathyroidism).  · A buildup of uric acid crystals, similar to gout in your joints.  · Narrowing (stricture) of the ureter.  · A kidney obstruction present at birth (congenital obstruction).  · Previous surgery on the kidney or ureters.  · Numerous kidney infections.  SYMPTOMS   · Feeling sick to your stomach (nauseous).  · Throwing up (vomiting).  · Blood in the urine (hematuria).  · Pain that usually spreads (radiates) to the groin.  · Frequency or urgency of urination.  DIAGNOSIS   · Taking a history and physical exam.  · Blood or urine tests.  · CT scan.  · Occasionally, an examination of the inside of the urinary bladder (cystoscopy) is performed.  TREATMENT   · Observation.  · Increasing your fluid intake.  · Extracorporeal shock wave lithotripsy This is a noninvasive procedure that uses shock waves to break up kidney stones.  · Surgery may be needed if you have severe pain or persistent obstruction. There are various surgical procedures. Most of the procedures are performed with the use of small instruments. Only small incisions are needed to accommodate these instruments, so recovery time is minimized.  The size, location, and chemical composition are all important variables that will determine the proper choice of action for you. Talk to your health care provider to better understand your situation so that you will minimize the risk of injury to yourself and your kidney.   HOME CARE INSTRUCTIONS   · Drink enough water and fluids to keep your urine clear or pale yellow. This will help you to pass the stone or stone fragments.  · Strain  all urine through the provided strainer. Keep all particulate matter and stones for your health care provider to see. The stone causing the pain may be as small as a grain of salt. It is very important to use the strainer each and every time you pass your urine. The collection of your stone will allow your health care provider to analyze it and verify that a stone has actually passed. The stone analysis will often identify what you can do to reduce the incidence of recurrences.  · Only take over-the-counter or prescription medicines for pain, discomfort, or fever as directed by your health care provider.  · Make a follow-up appointment with your health care provider as directed.  · Get follow-up X-rays if required. The absence of pain does not always mean that the stone has passed. It may have only stopped moving. If the urine remains completely obstructed, it can cause loss of kidney function or even complete destruction of the kidney. It is your responsibility to make sure X-rays and follow-ups are completed. Ultrasounds of the kidney can show blockages and the status of the kidney. Ultrasounds are not associated with any radiation and can be performed easily in a matter of minutes.  SEEK MEDICAL CARE IF:  · You experience pain that is progressive and unresponsive to any pain medicine you have been prescribed.  SEEK IMMEDIATE MEDICAL CARE IF:   · Pain cannot be controlled with the prescribed medicine.  · You have a fever   or shaking chills.  · The severity or intensity of pain increases over 18 hours and is not relieved by pain medicine.  · You develop a new onset of abdominal pain.  · You feel faint or pass out.  · You are unable to urinate.  MAKE SURE YOU:   · Understand these instructions.  · Will watch your condition.  · Will get help right away if you are not doing well or get worse.  Document Released: 08/12/2005 Document Revised: 04/14/2013 Document Reviewed: 01/13/2013  [employer]® Patient Information ©2014  [employer].

## 2013-10-19 NOTE — ED Provider Notes (Signed)
CSN: [id number]     Arrival date & time 10/19/13  1944 History  This chart was scribed for non-physician practitioner Cheron Schaumann, PA-C working with Celene Kras, MD by Dorothey Baseman, ED Scribe. This patient was seen in room MH02/MH02 and the patient's care was started at 8:18 PM.   Chief Complaint  Patient presents with  . Abdominal Pain   The history is provided by the patient. No language interpreter was used.   HPI Comments: Cynthia Roach is a 43 y.o. female with a history of ovarian cyst and nephrolithiasis who presents to the Emergency Department complaining of an intermittent pain to the LLQ of the abdomen that she states radiates to the left side of the back onset 5 days ago that has been progressively worsening to a constant pain over the past 11 hours. Patient reports associated nausea with two episodes of non-bilious, non-bloody emesis and diarrhea onset earlier today. She states her current symptoms feel similar to her past kidney stones, but are more severe. She denies dysuria, urinary frequency. Patient has allergies to oxycodone, morphine and related drugs, and amoxicillin. She reports a surgical history of one cesarean section. She states that her menstrual periods are usually irregular, but denies the possibility of being pregnant, although she reports that she does not use birth control. Patient also has a history of trigeminal neuralgia, chronic kidney disease, and HTN. Patient does not smoke or drink.  Past Medical History  Diagnosis Date  . Seasonal allergies   . Ovarian cyst   . Trigeminal neuralgia   . Chronic kidney disease     left ureteral stone  . Hypertension    Past Surgical History  Procedure Laterality Date  . Cesarean section     Family History  Problem Relation Age of Onset  . Alcohol abuse    . Arthritis    . Breast cancer    . Colon cancer    . Diabetes    . Hyperlipidemia    . Hypertension    . Prostate cancer    . Depression    . Stroke    .  Sudden death    . Coronary artery disease     History  Substance Use Topics  . Smoking status: Never Smoker   . Smokeless tobacco: Never Used  . Alcohol Use: No   OB History   Grav Para Term Preterm Abortions TAB SAB Ect Mult Living                 Review of Systems A complete 10 system review of systems was obtained and all systems are negative except as noted in the HPI and PMH.    Allergies  Oxycodone; Shellfish allergy; Amoxicillin; and Morphine and related  Home Medications   Current Outpatient Rx  Name  Route  Sig  Dispense  Refill  . amLODipine (NORVASC) 5 MG tablet      TAKE 1 TABLET (5 MG TOTAL) BY MOUTH DAILY.   30 tablet   11   . EPINEPHrine (EPIPEN 2-PAK) 0.3 mg/0.3 mL SOAJ injection   Intramuscular   Inject 0.3 mLs (0.3 mg total) into the muscle once.   1 Device   5   . gabapentin (NEURONTIN) 300 MG capsule   Oral   Take 1 capsule (300 mg total) by mouth 3 (three) times daily.   90 capsule   11   . losartan-hydrochlorothiazide (HYZAAR) 100-25 MG per tablet      TAKE 1  TABLET BY MOUTH DAILY.   30 tablet   11   . omeprazole (PRILOSEC) 40 MG capsule   Oral   Take 40 mg by mouth daily as needed.          Triage Vitals: BP 159/93  Pulse 94  Temp(Src) 98.6 F (37 C) (Oral)  Resp 20  Ht 5\' 5"  (1.651 m)  Wt 172 lb (78.019 kg)  BMI 28.62 kg/m2  SpO2 99%  LMP 08/19/2012  Physical Exam  Nursing note and vitals reviewed. Constitutional: She is oriented to person, place, and time. She appears well-developed and well-nourished. No distress.  HENT:  Head: Normocephalic and atraumatic.  Eyes: Conjunctivae are normal.  Neck: Normal range of motion. Neck supple.  Cardiovascular: Normal rate, regular rhythm and normal heart sounds.   Pulmonary/Chest: Effort normal and breath sounds normal. No respiratory distress.  Abdominal: Soft. Bowel sounds are normal. She exhibits no distension. There is tenderness.  Tenderness to palpation to the LLQ of the  abdomen and the left flank.   Musculoskeletal: Normal range of motion.  Neurological: She is alert and oriented to person, place, and time.  Skin: Skin is warm and dry.  Psychiatric: She has a normal mood and affect. Her behavior is normal.    ED Course  Procedures (including critical care time)  DIAGNOSTIC STUDIES: Oxygen Saturation is 99% on room air, normal by my interpretation.    COORDINATION OF CARE: 8:20 PM- Ordered UA. Will order a CT of the abdomen. Will order IV fluids, Dilaudid, and Zofran to manage symptoms. Discussed treatment plan with patient at bedside and patient verbalized agreement.   9:28 PM- Discussed that CT and UA results indicate the presence of kidney stones. Discussed treatment plan with patient at bedside and patient verbalized agreement.    Labs Review Labs Reviewed  URINALYSIS, ROUTINE W REFLEX MICROSCOPIC - Abnormal; Notable for the following:    APPearance CLOUDY (*)    Hgb urine dipstick LARGE (*)    All other components within normal limits  URINE MICROSCOPIC-ADD ON - Abnormal; Notable for the following:    Squamous Epithelial / LPF FEW (*)    All other components within normal limits  PREGNANCY, URINE   Imaging Review Ct Abdomen Pelvis Wo Contrast  10/19/2013   CLINICAL DATA:  Left lower quadrant abdominal pain. Nausea, vomiting and diarrhea.  EXAM: CT ABDOMEN AND PELVIS WITHOUT CONTRAST  TECHNIQUE: Multidetector CT imaging of the abdomen and pelvis was performed following the standard protocol without intravenous contrast.  COMPARISON:  03/17/2012.  FINDINGS: Small bilateral renal calculi, all measuring less than 5 mm in maximum diameter. Bilateral medullary calcifications are again demonstrated. Moderate dilatation of the left renal collecting system and ureter to the level of a 5 mm calculus in the distal ureter at the ureterovesical junction. The urinary bladder is essentially empty with no bladder or right ureteral calculi seen.  Unremarkable non  contrasted appearance of the liver, spleen, pancreas, gallbladder, adrenal glands, uterus and ovaries. No gastrointestinal abnormalities or enlarged lymph nodes. Normal appearing appendix. Minimal dependent atelectasis at the right lung base. Mild scoliosis.  IMPRESSION: 1. 5 mm distal left ureteral calculus causing moderate left hydronephrosis and hydroureter. 2. Small bilateral, nonobstructing renal calculi. 3. Bilateral medullary nephrocalcinosis.   Electronically Signed   By: Gordan Payment M.D.   On: 10/19/2013 21:05    EKG Interpretation   None       MDM 5mm stone distal left,   Pt counseled on stones,  Rx for pain medication, zofran and flomax.   Pt advised to follow up with urology for recheck   Final diagnoses:  Kidney stone on left side     I personally performed the services in this documentation, which was scribed in my presence.  The recorded information has been reviewed and considered.   Barnet PallKaren SofiaPAC.    Lonia SkinnerLeslie K WineglassSofia, PA-C 10/19/13 2138

## 2013-10-19 NOTE — ED Provider Notes (Signed)
Medical screening examination/treatment/procedure(s) were performed by non-physician practitioner and as supervising physician I was immediately available for consultation/collaboration.    Celene KrasJon R Terree Gaultney, MD 10/19/13 2200

## 2013-10-27 DIAGNOSIS — Z0279 Encounter for issue of other medical certificate: Secondary | ICD-10-CM

## 2014-03-21 ENCOUNTER — Telehealth: Payer: Self-pay | Admitting: *Deleted

## 2014-03-21 DIAGNOSIS — E119 Type 2 diabetes mellitus without complications: Secondary | ICD-10-CM

## 2014-03-21 NOTE — Telephone Encounter (Signed)
Patient will go to the [facility] to follow up DM Lipid, a1c, bmet, micro albumin ordered Diabetic bundle

## 2014-05-18 ENCOUNTER — Ambulatory Visit: Payer: Managed Care, Other (non HMO) | Admitting: Family Medicine

## 2014-05-18 ENCOUNTER — Ambulatory Visit (INDEPENDENT_AMBULATORY_CARE_PROVIDER_SITE_OTHER): Payer: Managed Care, Other (non HMO) | Admitting: Family Medicine

## 2014-05-18 ENCOUNTER — Encounter: Payer: Self-pay | Admitting: Family Medicine

## 2014-05-18 VITALS — BP 109/65 | HR 77 | Temp 98.1°F | Ht 65.0 in | Wt 182.0 lb

## 2014-05-18 DIAGNOSIS — O24419 Gestational diabetes mellitus in pregnancy, unspecified control: Secondary | ICD-10-CM | POA: Insufficient documentation

## 2014-05-18 DIAGNOSIS — E119 Type 2 diabetes mellitus without complications: Secondary | ICD-10-CM

## 2014-05-18 DIAGNOSIS — O99810 Abnormal glucose complicating pregnancy: Secondary | ICD-10-CM

## 2014-05-18 DIAGNOSIS — I10 Essential (primary) hypertension: Secondary | ICD-10-CM

## 2014-05-18 DIAGNOSIS — O24410 Gestational diabetes mellitus in pregnancy, diet controlled: Secondary | ICD-10-CM

## 2014-05-18 DIAGNOSIS — G500 Trigeminal neuralgia: Secondary | ICD-10-CM

## 2014-05-18 DIAGNOSIS — R51 Headache: Secondary | ICD-10-CM

## 2014-05-18 MED ORDER — EPINEPHRINE 0.3 MG/0.3ML IJ SOAJ: 0.3000 mg | Freq: Once | INTRAMUSCULAR | Status: DC

## 2014-05-18 MED ORDER — METHYLPREDNISOLONE ACETATE 40 MG/ML IJ SUSP
120.0000 mg | Freq: Once | INTRAMUSCULAR | Status: AC
  Administered 2014-05-18: 120 mg via INTRAMUSCULAR

## 2014-05-18 MED ORDER — OMEPRAZOLE 40 MG PO CPDR: 40.0000 mg | DELAYED_RELEASE_CAPSULE | Freq: Every day | ORAL | Status: DC | PRN

## 2014-05-18 MED ORDER — AMLODIPINE BESYLATE 5 MG PO TABS: ORAL_TABLET | ORAL | Status: DC

## 2014-05-18 MED ORDER — LOSARTAN POTASSIUM-HCTZ 100-25 MG PO TABS: ORAL_TABLET | ORAL | Status: DC

## 2014-05-18 MED ORDER — GABAPENTIN 300 MG PO CAPS: 300.0000 mg | ORAL_CAPSULE | Freq: Three times a day (TID) | ORAL | Status: DC

## 2014-05-18 MED ORDER — ONDANSETRON 4 MG PO TBDP
4.0000 mg | ORAL_TABLET | Freq: Three times a day (TID) | ORAL | Status: DC | PRN
Start: 2014-05-18 — End: 2017-06-10

## 2014-05-18 NOTE — Progress Notes (Signed)
   Subjective:    Patient ID: Cynthia Roach, female    DOB: November 09, 1970, 43 y.o.   MRN: [id number]  HPI Here for refills and for lab tests. She is scheduled for fasting labs but she is not fasting today. She has had a flare up of her neuralgia for the past week.    Review of Systems  Constitutional: Negative.   Respiratory: Negative.   Cardiovascular: Negative.   Neurological: Positive for headaches.       Objective:   Physical Exam  Constitutional: She is oriented to person, place, and time. She appears well-developed and well-nourished.  HENT:  Right Ear: External ear normal.  Left Ear: External ear normal.  Nose: Nose normal.  Mouth/Throat: Oropharynx is clear and moist.  Cardiovascular: Normal rate, regular rhythm, normal heart sounds and intact distal pulses.   Pulmonary/Chest: Effort normal and breath sounds normal.  Neurological: She is alert and oriented to person, place, and time. No cranial nerve deficit.          Assessment & Plan:  Given a steroid shot today. She will set up labs soon

## 2014-05-18 NOTE — Progress Notes (Signed)
Pre visit review using our clinic review tool, if applicable. No additional management support is needed unless otherwise documented below in the visit note. 

## 2014-05-18 NOTE — Addendum Note (Signed)
Addended by: Aniceto Boss A on: 05/18/2014 05:00 PM   Modules accepted: Orders

## 2014-11-08 ENCOUNTER — Other Ambulatory Visit: Payer: Self-pay | Admitting: Family Medicine

## 2015-02-20 ENCOUNTER — Other Ambulatory Visit: Payer: Self-pay

## 2015-02-22 ENCOUNTER — Ambulatory Visit (INDEPENDENT_AMBULATORY_CARE_PROVIDER_SITE_OTHER): Payer: Managed Care, Other (non HMO) | Admitting: Nurse Practitioner

## 2015-02-22 ENCOUNTER — Encounter: Payer: Self-pay | Admitting: Nurse Practitioner

## 2015-02-22 VITALS — BP 109/77 | HR 77 | Temp 98.4°F | Resp 16 | Ht 65.0 in | Wt 173.0 lb

## 2015-02-22 DIAGNOSIS — I10 Essential (primary) hypertension: Secondary | ICD-10-CM | POA: Diagnosis not present

## 2015-02-22 DIAGNOSIS — G441 Vascular headache, not elsewhere classified: Secondary | ICD-10-CM | POA: Diagnosis not present

## 2015-02-22 MED ORDER — RIZATRIPTAN BENZOATE 10 MG PO TABS: 10.0000 mg | ORAL_TABLET | ORAL | Status: DC | PRN

## 2015-02-22 NOTE — Patient Instructions (Signed)
Please take maxalt for headache. Please see neurology for sleep study.  Start taking norvasc at bedtime & continue hyzaar during daytime.  Limit salt to 2400 mg daily-that's how much is in 1 bigmac!  Drink plenty of water.  Follow up in 2 weeks .

## 2015-02-22 NOTE — Progress Notes (Signed)
Subjective:    Cynthia Roach is a 44 y.o. female who presents for evaluation of waking in mornings w/headache and elevated BP. She is accompanied by her son.  Symptoms began about 4 days ago. Generally, the headaches last about all day .  Blood pressure reading at home was 190/90-160/90 using automated cuff.  The headaches are usually throbbing and are located in forehead & temples.  Precipitating factors include: none which have been determined. She denies nasal congestion, sore throat, cough, fever. Associated neurologic symptoms: none. The patient denies dizziness, loss of balance, muscle weakness, speech difficulties and vision problems. Home treatment has included 2 T aleve with no improvement. She takes all bp meds in middle of day. Took last dose today. She has hx of snoring. Discussed possibility of sleep apnea, increased carbon dioxide contributing to morning HA. Will refer to neuro for sleep study.   The following portions of the patient's history were reviewed and updated as appropriate: allergies, current medications, past medical history, past social history, past surgical history and problem list.  Review of Systems Pertinent items are noted in HPI.    Objective:    BP 109/77 mmHg  Pulse 77  Temp(Src) 98.4 F (36.9 C) (Temporal)  Resp 16  Ht 5\' 5"  (1.651 m)  Wt 173 lb (78.472 kg)  BMI 28.79 kg/m2  SpO2 96%  LMP 08/19/2012 General appearance: alert, cooperative, appears stated age and no distress  Eyes: negative findings: lids and lashes normal, positive findings: conjunctiva: 1+ injection  Wearing contact R eye Throat: lips, mucosa, and tongue normal; teeth and gums normal Neck: no adenopathy, supple, symmetrical, trachea midline and thyroid: enlarged Lungs: clear to auscultation bilaterally Heart: regular rate and rhythm, S1, S2 normal, no murmur, click, rub or gallop Neurologic: Grossly normal    Assessment:Plan     1. Essential hypertension, benign Good control  in ofc No dose change, but take norvasc at qhs & continue hyzaar during day Salt restriction - Comprehensive metabolic panel - CBC - TSH - Microalbumin / creatinine urine ratio  2. Other vascular headache DD: sleep apnea - Sedimentation rate - rizatriptan (MAXALT) 10 MG tablet; Take 1 tablet (10 mg total) by mouth as needed for migraine. May repeat in 2 hours if needed  Dispense: 10 tablet; Refill: 0 - Ambulatory referral to Neurology  F/u 2 weeks

## 2015-02-22 NOTE — Progress Notes (Signed)
Pre visit review using our clinic review tool, if applicable. No additional management support is needed unless otherwise documented below in the visit note. 

## 2015-02-23 LAB — COMPREHENSIVE METABOLIC PANEL
ALBUMIN: 4.3 g/dL (ref 3.5–5.2)
ALT: 13 U/L (ref 0–35)
AST: 19 U/L (ref 0–37)
Alkaline Phosphatase: 112 U/L (ref 39–117)
BILIRUBIN TOTAL: 1 mg/dL (ref 0.2–1.2)
BUN: 26 mg/dL — ABNORMAL HIGH (ref 6–23)
CO2: 27 meq/L (ref 19–32)
Calcium: 9.8 mg/dL (ref 8.4–10.5)
Chloride: 100 mEq/L (ref 96–112)
Creatinine, Ser: 1.46 mg/dL — ABNORMAL HIGH (ref 0.40–1.20)
GFR: 50.37 mL/min — ABNORMAL LOW (ref >60.00)
GLUCOSE: 99 mg/dL (ref 70–99)
POTASSIUM: 3.8 meq/L (ref 3.5–5.1)
SODIUM: 136 meq/L (ref 135–145)
Total Protein: 8.2 g/dL (ref 6.0–8.3)

## 2015-02-23 LAB — MICROALBUMIN / CREATININE URINE RATIO
Creatinine,U: 347.7 mg/dL
MICROALB UR: 1.8 mg/dL (ref 0.0–1.9)
Microalb Creat Ratio: 0.5 mg/g (ref 0.0–30.0)

## 2015-02-23 LAB — TSH: TSH: 0.41 u[IU]/mL (ref 0.35–4.50)

## 2015-02-23 LAB — CBC
HCT: 43.5 % (ref 36.0–46.0)
Hemoglobin: 14.4 g/dL (ref 12.0–15.0)
MCHC: 33.1 g/dL (ref 30.0–36.0)
MCV: 91 fl (ref 78.0–100.0)
Platelets: 378 10*3/uL (ref 150.0–400.0)
RBC: 4.78 Mil/uL (ref 3.87–5.11)
RDW: 14.1 % (ref 11.5–15.5)
WBC: 10 10*3/uL (ref 4.0–10.5)

## 2015-02-23 LAB — SEDIMENTATION RATE: Sed Rate: 38 mm/hr — ABNORMAL HIGH (ref 0–22)

## 2015-02-24 ENCOUNTER — Other Ambulatory Visit: Payer: Managed Care, Other (non HMO)

## 2015-02-24 ENCOUNTER — Other Ambulatory Visit: Payer: Self-pay | Admitting: Nurse Practitioner

## 2015-02-24 ENCOUNTER — Telehealth: Payer: Self-pay | Admitting: Family Medicine

## 2015-02-24 DIAGNOSIS — E119 Type 2 diabetes mellitus without complications: Secondary | ICD-10-CM

## 2015-02-24 DIAGNOSIS — R700 Elevated erythrocyte sedimentation rate: Secondary | ICD-10-CM

## 2015-02-24 NOTE — Telephone Encounter (Signed)
Pt is calling concerning some of her abnormal blood work. Please call pt at work number

## 2015-02-24 NOTE — Telephone Encounter (Signed)
Layne, I think this note is supposed to be routed to you.

## 2015-02-24 NOTE — Telephone Encounter (Signed)
  Sed rate & kidney function tests are elevate. Additional labs added to screen for SLE.  Pt should avoid all NSAIDS. Take tylenol PRN pain or prescription migraine medicine for headaches.

## 2015-02-25 LAB — C3 AND C4
C3 Complement: 183 mg/dL — ABNORMAL HIGH (ref 90–180)
C4 COMPLEMENT: 32 mg/dL (ref 10–40)

## 2015-02-28 ENCOUNTER — Telehealth: Payer: Self-pay | Admitting: Nurse Practitioner

## 2015-02-28 DIAGNOSIS — R700 Elevated erythrocyte sedimentation rate: Secondary | ICD-10-CM

## 2015-02-28 DIAGNOSIS — R7989 Other specified abnormal findings of blood chemistry: Secondary | ICD-10-CM

## 2015-02-28 LAB — ANA: Anti Nuclear Antibody(ANA): NEGATIVE

## 2015-02-28 NOTE — Telephone Encounter (Signed)
pls call pt: Advise kidney function needs to be repeated in 2-3 weeks, as it was elevated. She should avoid all NSAIDS, goody's powders, BC powders.   pls schedule future lab appt.

## 2015-02-28 NOTE — Telephone Encounter (Signed)
Patient aware.   Lab appointment scheduled 03/14/15.

## 2015-03-14 ENCOUNTER — Other Ambulatory Visit (INDEPENDENT_AMBULATORY_CARE_PROVIDER_SITE_OTHER): Payer: Managed Care, Other (non HMO)

## 2015-03-14 DIAGNOSIS — R748 Abnormal levels of other serum enzymes: Secondary | ICD-10-CM

## 2015-03-14 DIAGNOSIS — R700 Elevated erythrocyte sedimentation rate: Secondary | ICD-10-CM | POA: Diagnosis not present

## 2015-03-14 DIAGNOSIS — R7989 Other specified abnormal findings of blood chemistry: Secondary | ICD-10-CM

## 2015-03-15 LAB — COMPREHENSIVE METABOLIC PANEL
ALBUMIN: 4.1 g/dL (ref 3.5–5.2)
ALT: 12 U/L (ref 0–35)
AST: 15 U/L (ref 0–37)
Alkaline Phosphatase: 116 U/L (ref 39–117)
BILIRUBIN TOTAL: 0.4 mg/dL (ref 0.2–1.2)
BUN: 13 mg/dL (ref 6–23)
CALCIUM: 9.8 mg/dL (ref 8.4–10.5)
CHLORIDE: 100 meq/L (ref 96–112)
CO2: 31 meq/L (ref 19–32)
Creatinine, Ser: 0.96 mg/dL (ref 0.40–1.20)
GFR: 81.7 mL/min (ref >60.00)
Glucose, Bld: 89 mg/dL (ref 70–99)
POTASSIUM: 4.2 meq/L (ref 3.5–5.1)
Sodium: 136 mEq/L (ref 135–145)
Total Protein: 7.7 g/dL (ref 6.0–8.3)

## 2015-03-15 LAB — SEDIMENTATION RATE: Sed Rate: 48 mm/hr — ABNORMAL HIGH (ref 0–22)

## 2015-03-16 ENCOUNTER — Other Ambulatory Visit: Payer: Self-pay | Admitting: Nurse Practitioner

## 2015-03-16 ENCOUNTER — Telehealth: Payer: Self-pay | Admitting: Nurse Practitioner

## 2015-03-16 DIAGNOSIS — R700 Elevated erythrocyte sedimentation rate: Secondary | ICD-10-CM

## 2015-03-16 NOTE — Progress Notes (Signed)
Lab add on form sent to [facility].

## 2015-03-16 NOTE — Telephone Encounter (Signed)
Spoke w/pt, she is still not feeling well-says she has not felt herself in 2 mos-has frequent dull HA, feels tired, eyes stay "congested". She is trying to push through & take care of family, work, etc, but it is becoming more difficult.   ESR is slightly higher than 2 weeks ago. Kidney function has normalized since stopped NSAIDS. Maxalt helps HA, but she does not like SE of drowsiness. SLE w/u appears neg.  Unable to add CBC-WBC was high nrml when last checked.  Recommend she see her PCP within 2 weeks for further work up. Pt agrees.

## 2015-03-21 ENCOUNTER — Ambulatory Visit (INDEPENDENT_AMBULATORY_CARE_PROVIDER_SITE_OTHER): Payer: Managed Care, Other (non HMO) | Admitting: Family Medicine

## 2015-03-21 ENCOUNTER — Encounter: Payer: Self-pay | Admitting: Family Medicine

## 2015-03-21 VITALS — BP 122/76 | HR 63 | Temp 99.1°F | Wt 173.0 lb

## 2015-03-21 DIAGNOSIS — R5382 Chronic fatigue, unspecified: Secondary | ICD-10-CM | POA: Diagnosis not present

## 2015-03-21 LAB — TSH: TSH: 1.03 u[IU]/mL (ref 0.35–4.50)

## 2015-03-21 LAB — CBC WITH DIFFERENTIAL/PLATELET
BASOS PCT: 0.5 % (ref 0.0–3.0)
Basophils Absolute: 0 10*3/uL (ref 0.0–0.1)
Eosinophils Absolute: 0 10*3/uL (ref 0.0–0.7)
Eosinophils Relative: 0.5 % (ref 0.0–5.0)
HCT: 37.3 % (ref 36.0–46.0)
Hemoglobin: 12.5 g/dL (ref 12.0–15.0)
LYMPHS PCT: 30.9 % (ref 12.0–46.0)
Lymphs Abs: 2.2 10*3/uL (ref 0.7–4.0)
MCHC: 33.4 g/dL (ref 30.0–36.0)
MCV: 90.9 fl (ref 78.0–100.0)
MONOS PCT: 8.9 % (ref 3.0–12.0)
Monocytes Absolute: 0.6 10*3/uL (ref 0.1–1.0)
Neutro Abs: 4.1 10*3/uL (ref 1.4–7.7)
Neutrophils Relative %: 59.2 % (ref 43.0–77.0)
Platelets: 344 10*3/uL (ref 150.0–400.0)
RBC: 4.1 Mil/uL (ref 3.87–5.11)
RDW: 12.9 % (ref 11.5–15.5)
WBC: 7 10*3/uL (ref 4.0–10.5)

## 2015-03-21 LAB — VITAMIN B12: Vitamin B-12: 351 pg/mL (ref 211–911)

## 2015-03-21 NOTE — Progress Notes (Signed)
   Subjective:    Patient ID: Cynthia Roach, female    DOB: 1970-11-05, 44 y.o.   MRN: [id number]  HPI Here to follow up on some generalized fatigue that started about a month ago. No other specific issues. She was seen in the [facility] office recently and had labs that were normal except for a mildly elebvated ESR. She denies any rashes or joint pains.    Review of Systems  Constitutional: Positive for fatigue. Negative for fever, appetite change and unexpected weight change.  Respiratory: Negative.   Cardiovascular: Negative.   Gastrointestinal: Negative.   Endocrine: Negative.   Genitourinary: Negative.   Musculoskeletal: Negative.   Skin: Negative.        Objective:   Physical Exam  Constitutional: She is oriented to person, place, and time. She appears well-developed and well-nourished.  Neck: No thyromegaly present.  Cardiovascular: Normal rate, regular rhythm, normal heart sounds and intact distal pulses.   Pulmonary/Chest: Effort normal and breath sounds normal.  Lymphadenopathy:    She has no cervical adenopathy.  Neurological: She is alert and oriented to person, place, and time.          Assessment & Plan:  Fatigue of uncertain etiology. Screen with a CBC, B12 level and TSH

## 2015-03-21 NOTE — Progress Notes (Signed)
Pre visit review using our clinic review tool, if applicable. No additional management support is needed unless otherwise documented below in the visit note. 

## 2015-04-12 ENCOUNTER — Institutional Professional Consult (permissible substitution): Payer: Managed Care, Other (non HMO) | Admitting: Neurology

## 2015-04-12 ENCOUNTER — Telehealth: Payer: Self-pay

## 2015-04-12 NOTE — Telephone Encounter (Signed)
Patient did not show to appt today  

## 2015-04-21 ENCOUNTER — Encounter: Payer: Self-pay | Admitting: Neurology

## 2015-05-06 ENCOUNTER — Emergency Department (HOSPITAL_COMMUNITY)
Admission: EM | Admit: 2015-05-06 | Discharge: 2015-05-06 | Disposition: A | Discharge status: Home / Self Care | Payer: Managed Care, Other (non HMO) | Attending: Emergency Medicine | Admitting: Emergency Medicine

## 2015-05-06 ENCOUNTER — Encounter (HOSPITAL_COMMUNITY): Payer: Self-pay | Admitting: Emergency Medicine

## 2015-05-06 ENCOUNTER — Emergency Department (HOSPITAL_COMMUNITY): Payer: Managed Care, Other (non HMO)

## 2015-05-06 DIAGNOSIS — Z3202 Encounter for pregnancy test, result negative: Secondary | ICD-10-CM | POA: Diagnosis not present

## 2015-05-06 DIAGNOSIS — N189 Chronic kidney disease, unspecified: Secondary | ICD-10-CM | POA: Diagnosis not present

## 2015-05-06 DIAGNOSIS — Z8742 Personal history of other diseases of the female genital tract: Secondary | ICD-10-CM | POA: Insufficient documentation

## 2015-05-06 DIAGNOSIS — Z8669 Personal history of other diseases of the nervous system and sense organs: Secondary | ICD-10-CM | POA: Insufficient documentation

## 2015-05-06 DIAGNOSIS — Z880 Allergy status to penicillin: Secondary | ICD-10-CM | POA: Diagnosis not present

## 2015-05-06 DIAGNOSIS — I129 Hypertensive chronic kidney disease with stage 1 through stage 4 chronic kidney disease, or unspecified chronic kidney disease: Secondary | ICD-10-CM | POA: Diagnosis not present

## 2015-05-06 DIAGNOSIS — R55 Syncope and collapse: Secondary | ICD-10-CM | POA: Diagnosis not present

## 2015-05-06 DIAGNOSIS — Z79899 Other long term (current) drug therapy: Secondary | ICD-10-CM | POA: Insufficient documentation

## 2015-05-06 DIAGNOSIS — Z8709 Personal history of other diseases of the respiratory system: Secondary | ICD-10-CM | POA: Insufficient documentation

## 2015-05-06 LAB — URINALYSIS, ROUTINE W REFLEX MICROSCOPIC
Bilirubin Urine: NEGATIVE
GLUCOSE, UA: NEGATIVE mg/dL
HGB URINE DIPSTICK: NEGATIVE
KETONES UR: NEGATIVE mg/dL
Nitrite: NEGATIVE
PROTEIN: NEGATIVE mg/dL
Specific Gravity, Urine: 1.018 (ref 1.005–1.030)
Urobilinogen, UA: 0.2 mg/dL (ref 0.0–1.0)
pH: 6 (ref 5.0–8.0)

## 2015-05-06 LAB — BASIC METABOLIC PANEL
Anion gap: 5 (ref 5–15)
BUN: 10 mg/dL (ref 6–20)
CALCIUM: 8.2 mg/dL — AB (ref 8.9–10.3)
CO2: 22 mmol/L (ref 22–32)
CREATININE: 1.33 mg/dL — AB (ref 0.44–1.00)
Chloride: 114 mmol/L — ABNORMAL HIGH (ref 101–111)
GFR calc Af Amer: 56 mL/min — ABNORMAL LOW (ref >60)
GFR, EST NON AFRICAN AMERICAN: 49 mL/min — AB (ref >60)
Glucose, Bld: 83 mg/dL (ref 65–99)
Potassium: 3.7 mmol/L (ref 3.5–5.1)
SODIUM: 141 mmol/L (ref 135–145)

## 2015-05-06 LAB — URINE MICROSCOPIC-ADD ON

## 2015-05-06 LAB — PREGNANCY, URINE: Preg Test, Ur: NEGATIVE

## 2015-05-06 LAB — CBG MONITORING, ED: Glucose-Capillary: 79 mg/dL (ref 65–99)

## 2015-05-06 MED ORDER — ACETAMINOPHEN 325 MG PO TABS
650.0000 mg | ORAL_TABLET | Freq: Once | ORAL | Status: AC
  Administered 2015-05-06: 650 mg via ORAL
  Filled 2015-05-06: qty 2

## 2015-05-06 NOTE — ED Notes (Signed)
Bed: WU98 Expected date:  Expected time:  Means of arrival:  Comments: Dehydration

## 2015-05-06 NOTE — ED Provider Notes (Signed)
CSN: [id number]     Arrival date & time 05/06/15  1730 History   First MD Initiated Contact with Patient 05/06/15 1731     Chief Complaint  Patient presents with  . Near Syncope     (Consider location/radiation/quality/duration/timing/severity/associated sxs/prior Treatment) HPI Cynthia Roach is a 44 y.o. female history of trigeminal neuralgia, CKD, comes in for evaluation of syncopal event. Patient states earlier this afternoon she was standing out on the football field volunteering when she became hot, experienced right eye blurred vision. She reports walking over to sit down on the bleachers and at that point she passed out. Bystanders report that patient was becoming "in and out of consciousness". Patient reports headache behind her right eye that has been present all day. She reports associated sensitivity to light as this exacerbates her headache. She believes she may have been dehydrated as she did not drink very much fluids today. She denies chest pain, shortness of breath, nausea or vomiting, diaphoresis, abdominal pain or any unilateral numbness or weakness. Patient does report she is wearing 2 different contact lenses today.  Past Medical History  Diagnosis Date  . Seasonal allergies   . Ovarian cyst   . Trigeminal neuralgia   . Chronic kidney disease     left ureteral stone  . Hypertension    Past Surgical History  Procedure Laterality Date  . Cesarean section     Family History  Problem Relation Age of Onset  . Alcohol abuse    . Arthritis    . Breast cancer    . Colon cancer    . Diabetes    . Hyperlipidemia    . Hypertension    . Prostate cancer    . Depression    . Stroke    . Sudden death    . Coronary artery disease     Social History  Substance Use Topics  . Smoking status: Never Smoker   . Smokeless tobacco: Never Used  . Alcohol Use: No   OB History    No data available     Review of Systems A 10 point review of systems was completed and  was negative except for pertinent positives and negatives as mentioned in the history of present illness     Allergies  Oxycodone; Shellfish allergy; Amoxicillin; and Morphine and related  Home Medications   Prior to Admission medications   Medication Sig Start Date End Date Taking? Authorizing Provider  amLODipine (NORVASC) 5 MG tablet TAKE 1 TABLET (5 MG TOTAL) BY MOUTH DAILY. Patient taking differently: Take 5 mg by mouth at bedtime.  05/18/14  Yes Nelwyn Salisbury, MD  BIOTIN PO Take 1 tablet by mouth at bedtime.   Yes Historical Provider, MD  losartan-hydrochlorothiazide (HYZAAR) 100-25 MG per tablet TAKE 1 TABLET BY MOUTH DAILY. Patient taking differently: Take 1 tablet by mouth at bedtime.  05/18/14  Yes Nelwyn Salisbury, MD  Multiple Vitamin (MULTIVITAMIN WITH MINERALS) TABS tablet Take 1 tablet by mouth at bedtime.   Yes Historical Provider, MD  EPINEPHrine (EPIPEN 2-PAK) 0.3 mg/0.3 mL IJ SOAJ injection Inject 0.3 mLs (0.3 mg total) into the muscle once. Patient not taking: Reported on 03/21/2015 05/18/14   Nelwyn Salisbury, MD  gabapentin (NEURONTIN) 300 MG capsule Take 1 capsule (300 mg total) by mouth 3 (three) times daily. Patient not taking: Reported on 03/21/2015 05/18/14   Nelwyn Salisbury, MD  omeprazole (PRILOSEC) 40 MG capsule Take 1 capsule (40 mg total) by  mouth daily as needed. Patient not taking: Reported on 03/21/2015 05/18/14   Nelwyn Salisbury, MD  ondansetron (ZOFRAN ODT) 4 MG disintegrating tablet Take 1 tablet (4 mg total) by mouth every 8 (eight) hours as needed for nausea or vomiting. Patient not taking: Reported on 03/21/2015 05/18/14   Nelwyn Salisbury, MD  rizatriptan (MAXALT) 10 MG tablet Take 1 tablet (10 mg total) by mouth as needed for migraine. May repeat in 2 hours if needed Patient not taking: Reported on 03/21/2015 02/22/15   Kelle Darting, NP   LMP 08/19/2012 Physical Exam  Constitutional: She is oriented to person, place, and time. She appears well-developed and  well-nourished.  HENT:  Head: Normocephalic and atraumatic.  Mouth/Throat: Oropharynx is clear and moist.  Eyes: Pupils are equal, round, and reactive to light. Right eye exhibits no discharge. Left eye exhibits no discharge. No scleral icterus.  Right conjunctiva is slightly injected. Patient is wearing new colored contact lens in right eye. Clear contact lens in left eye. Pupils are reactive bilaterally  Neck: Neck supple.  Cardiovascular: Normal rate, regular rhythm and normal heart sounds.   Pulmonary/Chest: Effort normal and breath sounds normal. No respiratory distress. She has no wheezes. She has no rales.  Abdominal: Soft. There is no tenderness.  Musculoskeletal: She exhibits no tenderness.  Neurological: She is alert and oriented to person, place, and time.  Cranial Nerves II-XII grossly intact. Motor and sensation are 5/5 in all 4 extremities. Extraocular movements intact without nystagmus. No pronator drift. Completes finger to nose coordination movements without difficulty.  Skin: Skin is warm and dry. No rash noted.  Psychiatric: She has a normal mood and affect.  Nursing note and vitals reviewed.   ED Course  Procedures (including critical care time) Labs Review Labs Reviewed  URINALYSIS, ROUTINE W REFLEX MICROSCOPIC (NOT AT [facility]) - Abnormal; Notable for the following:    APPearance CLOUDY (*)    Leukocytes, UA TRACE (*)    All other components within normal limits  URINE MICROSCOPIC-ADD ON - Abnormal; Notable for the following:    Squamous Epithelial / LPF MANY (*)    Bacteria, UA MANY (*)    All other components within normal limits  BASIC METABOLIC PANEL  CBG MONITORING, ED    Imaging Review No results found. I have personally reviewed and evaluated these images and lab results as part of my medical decision-making.   EKG Interpretation None     Meds given in ED:  Medications  acetaminophen (TYLENOL) tablet 650 mg (650 mg Oral Given 05/06/15 2016)     New Prescriptions   No medications on file   Filed Vitals:   05/06/15 1900 05/06/15 1930 05/06/15 2000 05/06/15 2129  BP: 164/98 144/82 142/89 131/71  Pulse: 91 80 81 80  Resp: 15 17 15 15   SpO2: 100% 100% 100% 100%    Visual Acuity  Right Eye Distance: 20/70 Left Eye Distance: 20/100 Bilateral Distance: 20/200  Right Eye Near:   Left Eye Near:    Bilateral Near:      MDM  Vitals stable - WNL -afebrile Pt resting comfortably in ED.  DDX--due to new onset headaches, unilateral with right-sided vision changes, obtained CT scan of head, which was negative. Nonfocal neurological exam. Otherwise unremarkable physical exam. Labs are unremarkable. Eye irritation on right side likely due to different contact use. Low suspicion for acute neurological pathology, central lesion or other emergent pathology at this time. Patient stable, in good condition and appropriate  for discharge. Suspect environmental cause/dehydration for syncopal event. Per [facility] sig minerals, patient is low risk. Patient states she feels well and is ready to go home. Discharged in the care of her family.  I discussed all relevant lab findings and imaging results with pt and they verbalized understanding. Discussed f/u with PCP within 48 hrs and return precautions, pt very amenable to plan.  Final diagnoses:  Syncope, unspecified syncope type        Joycie Peek, PA-C 05/07/15 1501  Margarita Grizzle, MD 05/08/15 1610

## 2015-05-06 NOTE — ED Notes (Signed)
Pt presents from football game via syncopal episode, no head injury, HA, A&O x4.   St106, 142cbg, 18g right ac 1L NS

## 2015-05-06 NOTE — ED Notes (Signed)
Heat pack and ice pack given to help w/ HA.

## 2015-05-06 NOTE — Discharge Instructions (Signed)
You were evaluated in the ED today after her syncopal event. It is not currently an emergent cause for your symptoms at this time. Your CT scan of your head was reassuring as well as your exam and labs. Please follow-up with your PCP for reevaluation in 2-3 days. Return to ED for worsening symptoms.  Syncope Syncope is a medical term for fainting or passing out. This means you lose consciousness and drop to the ground. People are generally unconscious for less than 5 minutes. You may have some muscle twitches for up to 15 seconds before waking up and returning to normal. Syncope occurs more often in older adults, but it can happen to anyone. While most causes of syncope are not dangerous, syncope can be a sign of a serious medical problem. It is important to seek medical care.  CAUSES  Syncope is caused by a sudden drop in blood flow to the brain. The specific cause is often not determined. Factors that can bring on syncope include:  Taking medicines that lower blood pressure.  Sudden changes in posture, such as standing up quickly.  Taking more medicine than prescribed.  Standing in one place for too long.  Seizure disorders.  Dehydration and excessive exposure to heat.  Low blood sugar (hypoglycemia).  Straining to have a bowel movement.  Heart disease, irregular heartbeat, or other circulatory problems.  Fear, emotional distress, seeing blood, or severe pain. SYMPTOMS  Right before fainting, you may:  Feel dizzy or light-headed.  Feel nauseous.  See all white or all black in your field of vision.  Have cold, clammy skin. DIAGNOSIS  Your health care provider will ask about your symptoms, perform a physical exam, and perform an electrocardiogram (ECG) to record the electrical activity of your heart. Your health care provider may also perform other heart or blood tests to determine the cause of your syncope which may include:  Transthoracic echocardiogram (TTE). During  echocardiography, sound waves are used to evaluate how blood flows through your heart.  Transesophageal echocardiogram (TEE).  Cardiac monitoring. This allows your health care provider to monitor your heart rate and rhythm in real time.  Holter monitor. This is a portable device that records your heartbeat and can help diagnose heart arrhythmias. It allows your health care provider to track your heart activity for several days, if needed.  Stress tests by exercise or by giving medicine that makes the heart beat faster. TREATMENT  In most cases, no treatment is needed. Depending on the cause of your syncope, your health care provider may recommend changing or stopping some of your medicines. HOME CARE INSTRUCTIONS  Have someone stay with you until you feel stable.  Do not drive, use machinery, or play sports until your health care provider says it is okay.  Keep all follow-up appointments as directed by your health care provider.  Lie down right away if you start feeling like you might faint. Breathe deeply and steadily. Wait until all the symptoms have passed.  Drink enough fluids to keep your urine clear or pale yellow.  If you are taking blood pressure or heart medicine, get up slowly and take several minutes to sit and then stand. This can reduce dizziness. SEEK IMMEDIATE MEDICAL CARE IF:   You have a severe headache.  You have unusual pain in the chest, abdomen, or back.  You are bleeding from your mouth or rectum, or you have black or tarry stool.  You have an irregular or very fast heartbeat.  You  have pain with breathing.  You have repeated fainting or seizure-like jerking during an episode.  You faint when sitting or lying down.  You have confusion.  You have trouble walking.  You have severe weakness.  You have vision problems. If you fainted, call your local emergency services (911 in [place] Do not drive yourself to the hospital.  MAKE SURE YOU:  Understand  these instructions.  Will watch your condition.  Will get help right away if you are not doing well or get worse. Document Released: 08/12/2005 Document Revised: 08/17/2013 Document Reviewed: 10/11/2011 [facility] Patient Information 2015 [place], [place]. This information is not intended to replace advice given to you by your health care provider. Make sure you discuss any questions you have with your health care provider.  Vasovagal Syncope, Adult Syncope, commonly known as fainting, is a temporary loss of consciousness. It occurs when the blood flow to the brain is reduced. Vasovagal syncope (also called neurocardiogenic syncope) is a fainting spell in which the blood flow to the brain is reduced because of a sudden drop in heart rate and blood pressure. Vasovagal syncope occurs when the brain and the cardiovascular system (blood vessels) do not adequately communicate and respond to each other. This is the most common cause of fainting. It often occurs in response to fear or some other type of emotional or physical stress. The body has a reaction in which the heart starts beating too slowly or the blood vessels expand, reducing blood pressure. This type of fainting spell is generally considered harmless. However, injuries can occur if a person takes a sudden fall during a fainting spell.  CAUSES  Vasovagal syncope occurs when a person's blood pressure and heart rate decrease suddenly, usually in response to a trigger. Many things and situations can trigger an episode. Some of these include:   Pain.   Fear.   The sight of blood or medical procedures, such as blood being drawn from a vein.   Common activities, such as coughing, swallowing, stretching, or going to the bathroom.   Emotional stress.   Prolonged standing, especially in a warm environment.   Lack of sleep or rest.   Prolonged lack of food.   Prolonged lack of fluids.   Recent illness.  The use of certain drugs that  affect blood pressure, such as cocaine, alcohol, marijuana, inhalants, and opiates.  SYMPTOMS  Before the fainting episode, you may:   Feel dizzy or light headed.   Become pale.  Sense that you are going to faint.   Feel like the room is spinning.   Have tunnel vision, only seeing directly in front of you.   Feel sick to your stomach (nauseous).   See spots or slowly lose vision.   Hear ringing in your ears.   Have a headache.   Feel warm and sweaty.   Feel a sensation of pins and needles. During the fainting spell, you will generally be unconscious for no longer than a couple minutes before waking up and returning to normal. If you get up too quickly before your body can recover, you may faint again. Some twitching or jerky movements may occur during the fainting spell.  DIAGNOSIS  Your caregiver will ask about your symptoms, take a medical history, and perform a physical exam. Various tests may be done to rule out other causes of fainting. These may include blood tests and tests to check the heart, such as electrocardiography, echocardiography, and possibly an electrophysiology study. When other causes  have been ruled out, a test may be done to check the body's response to changes in position (tilt table test). TREATMENT  Most cases of vasovagal syncope do not require treatment. Your caregiver may recommend ways to avoid fainting triggers and may provide home strategies for preventing fainting. If you must be exposed to a possible trigger, you can drink additional fluids to help reduce your chances of having an episode of vasovagal syncope. If you have warning signs of an oncoming episode, you can respond by positioning yourself favorably (lying down). If your fainting spells continue, you may be given medicines to prevent fainting. Some medicines may help make you more resistant to repeated episodes of vasovagal syncope. Special exercises or compression stockings may be  recommended. In rare cases, the surgical placement of a pacemaker is considered. HOME CARE INSTRUCTIONS   Learn to identify the warning signs of vasovagal syncope.   Sit or lie down at the first warning sign of a fainting spell. If sitting, put your head down between your legs. If you lie down, swing your legs up in the air to increase blood flow to the brain.   Avoid hot tubs and saunas.  Avoid prolonged standing.  Drink enough fluids to keep your urine clear or pale yellow. Avoid caffeine.  Increase salt in your diet as directed by your caregiver.   If you have to stand for a long time, perform movements such as:   Crossing your legs.   Flexing and stretching your leg muscles.   Squatting.   Moving your legs.   Bending over.   Only take over-the-counter or prescription medicines as directed by your caregiver. Do not suddenly stop any medicines without asking your caregiver first. SEEK MEDICAL CARE IF:   Your fainting spells continue or happen more frequently in spite of treatment.   You lose consciousness for more than a couple minutes.  You have fainting spells during or after exercising or after being startled.   You have new symptoms that occur with the fainting spells, such as:   Shortness of breath.  Chest pain.   Irregular heartbeat.   You have episodes of twitching or jerky movements that last longer than a few seconds.  You have episodes of twitching or jerky movements without obvious fainting. SEEK IMMEDIATE MEDICAL CARE IF:   You have injuries or bleeding after a fainting spell.   You have episodes of twitching or jerky movements that last longer than 5 minutes.   You have more than one spell of twitching or jerky movements before returning to consciousness after fainting. MAKE SURE YOU:   Understand these instructions.  Will watch your condition.  Will get help right away if you are not doing well or get worse. Document  Released: 07/29/2012 Document Reviewed: 07/29/2012 [facility] Patient Information 2015 [place], [place]. This information is not intended to replace advice given to you by your health care provider. Make sure you discuss any questions you have with your health care provider.

## 2015-05-06 NOTE — ED Notes (Signed)
Pt discharged to home

## 2015-05-16 ENCOUNTER — Telehealth: Payer: Self-pay | Admitting: Neurology

## 2015-05-16 NOTE — Telephone Encounter (Signed)
Do you need this information

## 2015-05-16 NOTE — Telephone Encounter (Signed)
Patient called inquiring about the letter she received due to missing an appointment. She states she called during lunch and left message with answering service to cancel appt for the next day. She did not want to r/s, said she would contact another facility.

## 2016-02-28 ENCOUNTER — Other Ambulatory Visit: Payer: Self-pay | Admitting: Family Medicine

## 2016-02-28 NOTE — Telephone Encounter (Signed)
Opened in error

## 2016-07-16 ENCOUNTER — Telehealth: Payer: Self-pay | Admitting: Family Medicine

## 2016-07-16 ENCOUNTER — Emergency Department (HOSPITAL_BASED_OUTPATIENT_CLINIC_OR_DEPARTMENT_OTHER)
Admission: EM | Admit: 2016-07-16 | Discharge: 2016-07-16 | Disposition: A | Discharge status: Home / Self Care | Payer: Managed Care, Other (non HMO) | Attending: Emergency Medicine | Admitting: Emergency Medicine

## 2016-07-16 ENCOUNTER — Encounter (HOSPITAL_BASED_OUTPATIENT_CLINIC_OR_DEPARTMENT_OTHER): Payer: Self-pay | Admitting: Emergency Medicine

## 2016-07-16 DIAGNOSIS — R51 Headache: Secondary | ICD-10-CM | POA: Insufficient documentation

## 2016-07-16 DIAGNOSIS — R519 Headache, unspecified: Secondary | ICD-10-CM

## 2016-07-16 DIAGNOSIS — I129 Hypertensive chronic kidney disease with stage 1 through stage 4 chronic kidney disease, or unspecified chronic kidney disease: Secondary | ICD-10-CM | POA: Insufficient documentation

## 2016-07-16 DIAGNOSIS — N189 Chronic kidney disease, unspecified: Secondary | ICD-10-CM | POA: Insufficient documentation

## 2016-07-16 DIAGNOSIS — Z79899 Other long term (current) drug therapy: Secondary | ICD-10-CM | POA: Insufficient documentation

## 2016-07-16 LAB — BASIC METABOLIC PANEL
Anion gap: 6 (ref 5–15)
BUN: 12 mg/dL (ref 6–20)
CHLORIDE: 103 mmol/L (ref 101–111)
CO2: 27 mmol/L (ref 22–32)
CREATININE: 0.88 mg/dL (ref 0.44–1.00)
Calcium: 9.3 mg/dL (ref 8.9–10.3)
GFR calc Af Amer: >60 mL/min (ref >60)
GFR calc non Af Amer: >60 mL/min (ref >60)
GLUCOSE: 86 mg/dL (ref 65–99)
POTASSIUM: 4.1 mmol/L (ref 3.5–5.1)
SODIUM: 136 mmol/L (ref 135–145)

## 2016-07-16 MED ORDER — KETOROLAC TROMETHAMINE 30 MG/ML IJ SOLN
30.0000 mg | Freq: Once | INTRAMUSCULAR | Status: AC
  Administered 2016-07-16: 30 mg via INTRAVENOUS
  Filled 2016-07-16: qty 1

## 2016-07-16 MED ORDER — AMLODIPINE BESYLATE 5 MG PO TABS
5.0000 mg | ORAL_TABLET | Freq: Once | ORAL | Status: AC
  Administered 2016-07-16: 5 mg via ORAL
  Filled 2016-07-16: qty 1

## 2016-07-16 MED ORDER — DIPHENHYDRAMINE HCL 50 MG/ML IJ SOLN
50.0000 mg | Freq: Once | INTRAMUSCULAR | Status: AC
  Administered 2016-07-16: 50 mg via INTRAVENOUS
  Filled 2016-07-16: qty 1

## 2016-07-16 MED ORDER — METOCLOPRAMIDE HCL 5 MG/ML IJ SOLN
10.0000 mg | Freq: Once | INTRAMUSCULAR | Status: AC
  Administered 2016-07-16: 10 mg via INTRAVENOUS
  Filled 2016-07-16: qty 2

## 2016-07-16 NOTE — ED Notes (Signed)
ED Provider at bedside. 

## 2016-07-16 NOTE — Discharge Instructions (Signed)

## 2016-07-16 NOTE — Telephone Encounter (Signed)
Patient checked into ED 

## 2016-07-16 NOTE — Telephone Encounter (Signed)
Patient Name: Cynthia Roach DOB: 1970-12-14 Initial Comment Caller states BP is 148/122, lowest; headache for 2 days; Nurse Assessment Nurse: Yetta BarreJones, RN, Miranda Date/Time (Eastern Time): 07/16/2016 10:50:07 AM Confirm and document reason for call. If symptomatic, describe symptoms. You must click the next button to save text entered. ---Caller states she has had a headache for the last couple of days. She has been out of her BP medication for 1 month. Her BP is 148/122. Does the patient have any new or worsening symptoms? ---Yes Will a triage be completed? ---Yes Related visit to physician within the last 2 weeks? ---No Does the PT have any chronic conditions? (i.e. diabetes, asthma, etc.) ---Yes List chronic conditions. ---HTN Is the patient pregnant or possibly pregnant? (Ask all females between the ages of 6912-55) ---No Is this a behavioral health or substance abuse call? ---No Guidelines Guideline Title Affirmed Question Affirmed Notes High Blood Pressure [1] BP # 160 / 100 AND [2] cardiac or neurologic symptoms (e.g., chest pain, difficulty breathing, unsteady gait, blurred vision) Final Disposition User Go to ED Now Yetta BarreJones, RN, [facility]

## 2016-07-16 NOTE — ED Triage Notes (Signed)
Patient c/o generalized headache x3 days. Patient also hypertensive today, hx of hypertension. Patient denies hx of problems with headache. Patient has taken Aleve at home without relief, last dose last night at bedtime. Patient reports nausea without emesis.

## 2016-07-16 NOTE — ED Provider Notes (Signed)
Emergency Department Provider Note   I have reviewed the triage vital signs and the nursing notes.   HISTORY  Chief Complaint Headache   HPI Cynthia Roach is a 45 y.o. female with PMH of CKD, HTN, and trigeminal neuralgia presents to the emergency department for evaluation of mild right sided headache for the past 3 days. Headache onset was slow. She reports pain primarily in the right side of her head and feels like a throbbing. It is made slightly worse by bending over. She has noticed associated high blood pressures despite being compliant with her blood pressure medication. She's had a similar headache in the past that she attributes to blood pressure. No associated nausea or vomiting. No recent head injuries. Patient denies any photophobia or phonophobia. She has tried over-the-counter pain medication without relief.  Past Medical History:  Diagnosis Date  . Chronic kidney disease    left ureteral stone  . Hypertension   . Ovarian cyst   . Seasonal allergies   . Trigeminal neuralgia     Patient Active Problem List   Diagnosis Date Noted  . Gestational diabetes 05/18/2014  . Trigeminal neuralgia 05/11/2013  . GOITER 02/10/2009  . HYPERTENSION 04/29/2007  . HEADACHE 04/29/2007    Past Surgical History:  Procedure Laterality Date  . CESAREAN SECTION      Current Outpatient Rx  . Order #: [id number] Class: Normal  . Order #: [id number] Class: Normal  . Order #: [id number] Class: Historical Med  . Order #: [id number] Class: Normal  . Order #: [id number] Class: Historical Med  . Order #: [id number] Class: Normal  . Order #: [id number] Class: Print  . Order #: [id number] Class: Normal  . Order #: [id number] Class: Normal    Allergies Oxycodone; Shellfish allergy; Amoxicillin; and Morphine and related  Family History  Problem Relation Age of Onset  . Alcohol abuse    . Arthritis    . Breast cancer    . Colon cancer    . Diabetes    . Hyperlipidemia    . Hypertension     . Prostate cancer    . Depression    . Stroke    . Sudden death    . Coronary artery disease      Social History Social History  Substance Use Topics  . Smoking status: Never Smoker  . Smokeless tobacco: Never Used  . Alcohol use No    Review of Systems  Constitutional: No fever/chills Eyes: No visual changes. ENT: No sore throat. Cardiovascular: Denies chest pain. Respiratory: Denies shortness of breath. Gastrointestinal: No abdominal pain.  No nausea, no vomiting.  No diarrhea.  No constipation. Genitourinary: Negative for dysuria. Musculoskeletal: Negative for back pain. Skin: Negative for rash. Neurological: Negative for focal weakness or numbness. Positive HA.   10-point ROS otherwise negative.  ____________________________________________   PHYSICAL EXAM:  VITAL SIGNS: ED Triage Vitals  Enc Vitals Group     BP 07/16/16 1406 (!) 184/104     Pulse Rate 07/16/16 1406 69     Resp 07/16/16 1406 18     Temp 07/16/16 1406 97.8 F (36.6 C)     Temp Source 07/16/16 1406 Oral     SpO2 07/16/16 1406 99 %     Weight 07/16/16 1406 172 lb (78 kg)     Height 07/16/16 1406 5\' 5"  (1.651 m)     Pain Score 07/16/16 1413 10   Constitutional: Alert and oriented. Well appearing and in no acute distress. Eyes: Conjunctivae are normal. PERRL. Head: Atraumatic.  Nose: No congestion/rhinnorhea. Mouth/Throat: Mucous membranes are moist. Neck: No stridor.   Cardiovascular: Normal rate, regular rhythm. Good peripheral circulation. Grossly normal heart sounds.   Respiratory: Normal respiratory effort.  No retractions. Lungs CTAB. Gastrointestinal: Soft and nontender. No distention.  Musculoskeletal: No lower extremity tenderness nor edema. No gross deformities of extremities. Neurologic:  Normal speech and language. No gross focal neurologic deficits are appreciated. Normal gait. No cerebellar ataxia.  Skin:  Skin is warm, dry and intact. No rash  noted.   ____________________________________________   LABS (all labs ordered are listed, but only abnormal results are displayed)  Labs Reviewed  BASIC METABOLIC PANEL   ____________________________________________  RADIOLOGY  None ____________________________________________   PROCEDURES  Procedure(s) performed:   Procedures  None ____________________________________________   INITIAL IMPRESSION / ASSESSMENT AND PLAN / ED COURSE  Pertinent labs & imaging results that were available during my care of the patient were reviewed by me and considered in my medical decision making (see chart for details).  Patient resents emergency department for evaluation of 3 days of slow onset headache. No focal neurological deficits. Extremely low suspicion for acute intracranial bleed given slow onset and reassuring neurological exam. The patient does have elevated blood pressure here but no evidence of hypertensive emergency. Plan for i-STAT Chem-8 and headache medication. We'll give the patient's p.m. home blood pressure medication. Advised that if no evidence of hypertensive emergency is present she will follow with her primary care physician for reconsideration of her hypertension medication regimen.   03:55 PM Patient is feeling much better after medication. Labs have been reviewed and are audible. No evidence of end organ damage from elevated blood pressure. Eyes the patient keep a headache diary and discussing with her primary care physician. Discussed return precautions for emergency headache symptoms in detail.   At this time, I do not feel there is any life-threatening condition present. I have reviewed and discussed all results (EKG, imaging, lab, urine as appropriate), exam findings with patient. I have reviewed nursing notes and appropriate previous records.  I feel the patient is safe to be discharged home without further emergent workup. Discussed usual and customary return  precautions. Patient and family (if present) verbalize understanding and are comfortable with this plan.  Patient will follow-up with their primary care provider. If they do not have a primary care provider, information for follow-up has been provided to them. All questions have been answered.  ____________________________________________  FINAL CLINICAL IMPRESSION(S) / ED DIAGNOSES  Final diagnoses:  Acute nonintractable headache, unspecified headache type     MEDICATIONS GIVEN DURING THIS VISIT:  Medications  amLODipine (NORVASC) tablet 5 mg (5 mg Oral Given 07/16/16 1537)  ketorolac (TORADOL) 30 MG/ML injection 30 mg (30 mg Intravenous Given 07/16/16 1542)  metoCLOPramide (REGLAN) injection 10 mg (10 mg Intravenous Given 07/16/16 1539)  diphenhydrAMINE (BENADRYL) injection 50 mg (50 mg Intravenous Given 07/16/16 1537)     NEW OUTPATIENT MEDICATIONS STARTED DURING THIS VISIT:  None   Note:  This document was prepared using Dragon voice recognition software and may include unintentional dictation errors.  Alona BeneJoshua Long, MD Emergency Medicine   Maia PlanJoshua G Long, MD 07/17/16 [phone]

## 2016-07-19 ENCOUNTER — Emergency Department (HOSPITAL_COMMUNITY)
Admission: EM | Admit: 2016-07-19 | Discharge: 2016-07-19 | Disposition: A | Discharge status: Home / Self Care | Payer: Managed Care, Other (non HMO) | Attending: Emergency Medicine | Admitting: Emergency Medicine

## 2016-07-19 ENCOUNTER — Encounter (HOSPITAL_COMMUNITY): Payer: Self-pay

## 2016-07-19 ENCOUNTER — Emergency Department (HOSPITAL_COMMUNITY): Payer: Managed Care, Other (non HMO)

## 2016-07-19 DIAGNOSIS — R0789 Other chest pain: Secondary | ICD-10-CM | POA: Insufficient documentation

## 2016-07-19 DIAGNOSIS — I129 Hypertensive chronic kidney disease with stage 1 through stage 4 chronic kidney disease, or unspecified chronic kidney disease: Secondary | ICD-10-CM | POA: Insufficient documentation

## 2016-07-19 DIAGNOSIS — Z79899 Other long term (current) drug therapy: Secondary | ICD-10-CM | POA: Insufficient documentation

## 2016-07-19 DIAGNOSIS — N189 Chronic kidney disease, unspecified: Secondary | ICD-10-CM | POA: Insufficient documentation

## 2016-07-19 LAB — CBC
HEMATOCRIT: 39.9 % (ref 36.0–46.0)
HEMOGLOBIN: 13.6 g/dL (ref 12.0–15.0)
MCH: 30.9 pg (ref 26.0–34.0)
MCHC: 34.1 g/dL (ref 30.0–36.0)
MCV: 90.7 fL (ref 78.0–100.0)
Platelets: 335 10*3/uL (ref 150–400)
RBC: 4.4 MIL/uL (ref 3.87–5.11)
RDW: 12.7 % (ref 11.5–15.5)
WBC: 7.1 10*3/uL (ref 4.0–10.5)

## 2016-07-19 LAB — BASIC METABOLIC PANEL
ANION GAP: 8 (ref 5–15)
BUN: 8 mg/dL (ref 6–20)
CALCIUM: 9.4 mg/dL (ref 8.9–10.3)
CO2: 26 mmol/L (ref 22–32)
Chloride: 104 mmol/L (ref 101–111)
Creatinine, Ser: 1.08 mg/dL — ABNORMAL HIGH (ref 0.44–1.00)
GLUCOSE: 95 mg/dL (ref 65–99)
POTASSIUM: 3.9 mmol/L (ref 3.5–5.1)
SODIUM: 138 mmol/L (ref 135–145)

## 2016-07-19 LAB — I-STAT TROPONIN, ED: TROPONIN I, POC: 0 ng/mL (ref 0.00–0.08)

## 2016-07-19 LAB — TROPONIN I

## 2016-07-19 MED ORDER — KETOROLAC TROMETHAMINE 15 MG/ML IJ SOLN
15.0000 mg | Freq: Once | INTRAMUSCULAR | Status: AC
  Administered 2016-07-19: 15 mg via INTRAVENOUS
  Filled 2016-07-19: qty 1

## 2016-07-19 NOTE — Discharge Instructions (Signed)
Follow up with Primary Physician.

## 2016-07-19 NOTE — ED Provider Notes (Signed)
[facility]-EMERGENCY DEPT Provider Note   CSN: [id number] Arrival date & time: 07/19/16  1411  History   Chief Complaint Chief Complaint  Patient presents with  . Chest Pain   HPI Cynthia Roach is a 45 y.o. female.  HPI Presenting with chest tightness and pain with radiation to the left arm. Symptoms started around 12pm while she was making lunch for her family. Slightly worsened when she sat down, but then improved with rest. Did not take any medication. Reports for the last two weeks, she has felt like her heart has skipped beats and raced; this occurs approximately 3-4 times per day. Reports prior to todays episode of chest tightness, she noted her heart skip a beat and then suddenly start racing before it resolved. Does not normally drink caffeine. Also notes diaphoresis with episode. Husband reports she looked very anxious and worried prior to episode.   EMS was called and she received 2 nitro and one 325mg  Aspirin.   Past Medical History:  Diagnosis Date  . Chronic kidney disease    left ureteral stone  . Hypertension   . Ovarian cyst   . Seasonal allergies   . Trigeminal neuralgia     Patient Active Problem List   Diagnosis Date Noted  . Gestational diabetes 05/18/2014  . Trigeminal neuralgia 05/11/2013  . GOITER 02/10/2009  . HYPERTENSION 04/29/2007  . HEADACHE 04/29/2007    Past Surgical History:  Procedure Laterality Date  . CESAREAN SECTION      OB History    No data available     Home Medications    Prior to Admission medications   Medication Sig Start Date End Date Taking? Authorizing Provider  amLODipine (NORVASC) 5 MG tablet TAKE 1 TABLET (5 MG TOTAL) BY MOUTH DAILY. Patient taking differently: Take 5 mg by mouth at bedtime.  05/18/14   Nelwyn SalisburyStephen A Fry, MD  BIOTIN PO Take 1 tablet by mouth at bedtime.    Historical Provider, MD  EPINEPHrine (EPIPEN 2-PAK) 0.3 mg/0.3 mL IJ SOAJ injection Inject 0.3 mLs (0.3 mg total) into the muscle once. Patient not  taking: Reported on 07/16/2016 05/18/14   Nelwyn SalisburyStephen A Fry, MD  gabapentin (NEURONTIN) 300 MG capsule Take 1 capsule (300 mg total) by mouth 3 (three) times daily. 05/18/14   Nelwyn SalisburyStephen A Fry, MD  losartan-hydrochlorothiazide (HYZAAR) 100-25 MG per tablet TAKE 1 TABLET BY MOUTH DAILY. Patient taking differently: Take 1 tablet by mouth at bedtime.  05/18/14   Nelwyn SalisburyStephen A Fry, MD  Multiple Vitamin (MULTIVITAMIN WITH MINERALS) TABS tablet Take 1 tablet by mouth at bedtime.    Historical Provider, MD  omeprazole (PRILOSEC) 40 MG capsule Take 1 capsule (40 mg total) by mouth daily as needed. 05/18/14   Nelwyn SalisburyStephen A Fry, MD  ondansetron (ZOFRAN ODT) 4 MG disintegrating tablet Take 1 tablet (4 mg total) by mouth every 8 (eight) hours as needed for nausea or vomiting. Patient not taking: Reported on 07/16/2016 05/18/14   Nelwyn SalisburyStephen A Fry, MD  rizatriptan (MAXALT) 10 MG tablet Take 1 tablet (10 mg total) by mouth as needed for migraine. May repeat in 2 hours if needed Patient not taking: Reported on 07/16/2016 02/22/15   Kelle DartingLayne C Weaver, NP    Family History Family History  Problem Relation Age of Onset  . Alcohol abuse    . Arthritis    . Breast cancer    . Colon cancer    . Diabetes    . Hyperlipidemia    . Hypertension    .  Prostate cancer    . Depression    . Stroke    . Sudden death    . Coronary artery disease      Social History Social History  Substance Use Topics  . Smoking status: Never Smoker  . Smokeless tobacco: Never Used  . Alcohol use No     Allergies   Oxycodone; Shellfish allergy; Amoxicillin; and Morphine and related   Review of Systems Review of Systems  Constitutional: Positive for diaphoresis. Negative for fever.  Respiratory: Positive for chest tightness. Negative for shortness of breath.   Cardiovascular: Positive for chest pain and palpitations. Negative for leg swelling.  Gastrointestinal: Negative for abdominal pain, diarrhea, nausea and vomiting.  Neurological: Positive  for headaches.     Physical Exam Updated Vital Signs BP 158/96   Pulse 69   Resp 15   Ht 5\' 5"  (1.651 m)   Wt 78 kg   LMP 08/19/2012 Comment: tubes tied  SpO2 99%   BMI 28.62 kg/m   Physical Exam  Constitutional: She is oriented to person, place, and time. She appears well-developed and well-nourished. No distress.  HENT:  Head: Normocephalic and atraumatic.  Eyes: EOM are normal. Pupils are equal, round, and reactive to light.  Cardiovascular: Normal rate, regular rhythm and intact distal pulses.   No murmur heard. Pulmonary/Chest: Effort normal. No respiratory distress. She has no wheezes.  Abdominal: Soft. Bowel sounds are normal. She exhibits no distension. There is no tenderness.  Musculoskeletal: She exhibits no edema.  Neurological: She is alert and oriented to person, place, and time.  Skin: Skin is warm. Capillary refill takes less than 2 seconds. No rash noted. She is not diaphoretic.  Psychiatric: She has a normal mood and affect. Her behavior is normal.     ED Treatments / Results  Labs (all labs ordered are listed, but only abnormal results are displayed) Labs Reviewed  BASIC METABOLIC PANEL - Abnormal; Notable for the following:       Result Value   Creatinine, Ser 1.08 (*)    All other components within normal limits  CBC  TROPONIN I  I-STAT TROPOININ, ED    EKG  EKG Interpretation  Date/Time:  Friday July 19 2016 14:24:33 EST Ventricular Rate:  75 PR Interval:    QRS Duration: 76 QT Interval:  399 QTC Calculation: 446 R Axis:   56 Text Interpretation:  Sinus rhythm Borderline ST elevation, inferior leads No significant change since last tracing Confirmed by KNAPP  MD-J, JON ([id number]) on 07/19/2016 2:30:15 PM Also confirmed by [facility]  MD-J, JON [phone]), editor Caruthers, Cala Bradford [phone])  on 07/19/2016 2:42:14 PM       Radiology Dg Chest 2 View  Result Date: 07/19/2016 CLINICAL DATA:  Chest pain EXAM: CHEST  2 VIEW COMPARISON:  02/25/2013  FINDINGS: The heart size and mediastinal contours are within normal limits. Both lungs are clear. The visualized skeletal structures are unremarkable. Suspected hair artifact over the supraclavicular area and lung apices on frontal view. IMPRESSION: 1. Suspect hair artifact over the supraclavicular regions and lung apices on frontal view. 2. No radiographic evidence for acute cardiopulmonary abnormality. Electronically Signed   By: Jasmine Pang M.D.   On: 07/19/2016 15:32    Procedures Procedures (including critical care time)  Medications Ordered in ED Medications  ketorolac (TORADOL) 15 MG/ML injection 15 mg (not administered)     Initial Impression / Assessment and Plan / ED Course  I have reviewed the triage vital signs and the  nursing notes.  Pertinent labs & imaging results that were available during my care of the patient were reviewed by me and considered in my medical decision making (see chart for details).  Clinical Course   - EKG with normal sinus rhythm - CMP normal except for mildly elevated creatinine of 1.08 - Troponin negative. Repeat also negative - CBC normal  - Heart Score 2 - PERC 0  - Reports headache, consistent with prior. Toradol given.  Final Clinical Impressions(s) / ED Diagnoses   Final diagnoses:  Atypical chest pain  Workup negative with low Heart Score and negative delta troponins. Follow up with PCP. May consider outpatient Cardiac workup.  New Prescriptions New Prescriptions   No medications on file     Araceli BoucheRaleigh N Jaquilla Woodroof, DO 07/19/16 1932    Lyndal Pulleyaniel Knott, MD 07/20/16 [phone]

## 2016-07-19 NOTE — ED Notes (Signed)
Patient verbalized understanding of discharge instructions and denies any further needs or questions at this time. VS stable. Patient ambulatory with steady gait.  

## 2016-07-19 NOTE — ED Triage Notes (Signed)
Pt. CO of CP today in the center to R side. Relief with 2 nitro. Was at med center on Wednesday for HTN and there increased her HTN meds. Received 324 ASA with the fire dept and she has been hypertensive with EMS

## 2016-09-15 ENCOUNTER — Encounter (HOSPITAL_BASED_OUTPATIENT_CLINIC_OR_DEPARTMENT_OTHER): Payer: Self-pay | Admitting: Emergency Medicine

## 2016-09-15 ENCOUNTER — Emergency Department (HOSPITAL_BASED_OUTPATIENT_CLINIC_OR_DEPARTMENT_OTHER)
Admission: EM | Admit: 2016-09-15 | Discharge: 2016-09-16 | Disposition: A | Discharge status: Home / Self Care | Payer: Managed Care, Other (non HMO) | Attending: Emergency Medicine | Admitting: Emergency Medicine

## 2016-09-15 DIAGNOSIS — N189 Chronic kidney disease, unspecified: Secondary | ICD-10-CM | POA: Diagnosis not present

## 2016-09-15 DIAGNOSIS — R519 Headache, unspecified: Secondary | ICD-10-CM

## 2016-09-15 DIAGNOSIS — Z79899 Other long term (current) drug therapy: Secondary | ICD-10-CM | POA: Diagnosis not present

## 2016-09-15 DIAGNOSIS — R112 Nausea with vomiting, unspecified: Secondary | ICD-10-CM | POA: Diagnosis present

## 2016-09-15 DIAGNOSIS — R51 Headache: Secondary | ICD-10-CM | POA: Insufficient documentation

## 2016-09-15 DIAGNOSIS — K920 Hematemesis: Secondary | ICD-10-CM | POA: Diagnosis not present

## 2016-09-15 DIAGNOSIS — I129 Hypertensive chronic kidney disease with stage 1 through stage 4 chronic kidney disease, or unspecified chronic kidney disease: Secondary | ICD-10-CM | POA: Insufficient documentation

## 2016-09-15 LAB — COMPREHENSIVE METABOLIC PANEL
ALK PHOS: 93 U/L (ref 38–126)
ALT: 14 U/L (ref 14–54)
AST: 23 U/L (ref 15–41)
Albumin: 4 g/dL (ref 3.5–5.0)
Anion gap: 8 (ref 5–15)
BUN: 11 mg/dL (ref 6–20)
CALCIUM: 9.2 mg/dL (ref 8.9–10.3)
CHLORIDE: 103 mmol/L (ref 101–111)
CO2: 27 mmol/L (ref 22–32)
CREATININE: 0.9 mg/dL (ref 0.44–1.00)
Glucose, Bld: 102 mg/dL — ABNORMAL HIGH (ref 65–99)
Potassium: 3.8 mmol/L (ref 3.5–5.1)
Sodium: 138 mmol/L (ref 135–145)
Total Bilirubin: 0.4 mg/dL (ref 0.3–1.2)
Total Protein: 8 g/dL (ref 6.5–8.1)

## 2016-09-15 LAB — URINALYSIS, MICROSCOPIC (REFLEX): WBC, UA: NONE SEEN WBC/hpf (ref 0–5)

## 2016-09-15 LAB — LIPASE, BLOOD: Lipase: 24 U/L (ref 11–51)

## 2016-09-15 LAB — URINALYSIS, ROUTINE W REFLEX MICROSCOPIC
Bilirubin Urine: NEGATIVE
GLUCOSE, UA: NEGATIVE mg/dL
Ketones, ur: NEGATIVE mg/dL
LEUKOCYTES UA: NEGATIVE
Nitrite: NEGATIVE
PROTEIN: NEGATIVE mg/dL
Specific Gravity, Urine: 1.021 (ref 1.005–1.030)
pH: 7 (ref 5.0–8.0)

## 2016-09-15 LAB — CBC
HCT: 36 % (ref 36.0–46.0)
Hemoglobin: 12 g/dL (ref 12.0–15.0)
MCH: 30.6 pg (ref 26.0–34.0)
MCHC: 33.3 g/dL (ref 30.0–36.0)
MCV: 91.8 fL (ref 78.0–100.0)
PLATELETS: 471 10*3/uL — AB (ref 150–400)
RBC: 3.92 MIL/uL (ref 3.87–5.11)
RDW: 12.4 % (ref 11.5–15.5)
WBC: 8.2 10*3/uL (ref 4.0–10.5)

## 2016-09-15 MED ORDER — SODIUM CHLORIDE 0.9 % IV BOLUS (SEPSIS)
1000.0000 mL | Freq: Once | INTRAVENOUS | Status: AC
  Administered 2016-09-15: 1000 mL via INTRAVENOUS

## 2016-09-15 MED ORDER — PROCHLORPERAZINE EDISYLATE 5 MG/ML IJ SOLN
10.0000 mg | Freq: Once | INTRAMUSCULAR | Status: AC
  Administered 2016-09-15: 10 mg via INTRAVENOUS
  Filled 2016-09-15: qty 2

## 2016-09-15 MED ORDER — DEXAMETHASONE SODIUM PHOSPHATE 10 MG/ML IJ SOLN
10.0000 mg | Freq: Once | INTRAMUSCULAR | Status: AC
  Administered 2016-09-15: 10 mg via INTRAVENOUS
  Filled 2016-09-15: qty 1

## 2016-09-15 MED ORDER — DIPHENHYDRAMINE HCL 50 MG/ML IJ SOLN
25.0000 mg | Freq: Once | INTRAMUSCULAR | Status: AC
  Administered 2016-09-15: 25 mg via INTRAVENOUS
  Filled 2016-09-15: qty 1

## 2016-09-15 MED ORDER — ONDANSETRON 4 MG PO TBDP
4.0000 mg | ORAL_TABLET | Freq: Once | ORAL | Status: AC | PRN
  Administered 2016-09-15: 4 mg via ORAL
  Filled 2016-09-15: qty 1

## 2016-09-15 NOTE — ED Provider Notes (Signed)
[facility] Provider Note   CSN: [id number] Arrival date & time: 09/15/16  1936  By signing my name below, I, Octavia Heir, attest that this documentation has been prepared under the direction and in the presence of Rise Mu, PA-C.  Electronically Signed: Octavia Heir, ED Scribe. 09/15/16. 11:20 PM.    History   Chief Complaint Chief Complaint  Patient presents with  . Emesis   The history is provided by the patient. No language interpreter was used.   HPI Comments: Cynthia Roach is a 46 y.o. female who a PMhx of CKD, HTN, and trigeminal neuralgia presents to the Emergency Department complaining of acute onset, moderate, hematemesis ( x 4) that began earlier this afternoon. Pt expresses vomiting dark red blood once and then bright red blood 3 times day. She notes having flu-like symptoms for the past 2 weeks that was progressively getting better until today.  She reports associated mild cough, nausea, diarrhea, and intermittent, left sided pulsating headache. Pt says the last time she vomited was ~ 2pm this evening. . She is currently on her menstrual cycle. She is compliant with her Amlodipine. She has no hx of abdominal ulcers or hx IBS. Pt has an abdominal surgical hx of cesarean section. Pt denies blood in stool, melena, phonophobia, photophobia, visual disturbances, abdominal pain, and neck pain/stiffness. Denies sudden onset or maximal onset headache. She is a non-smoker. Denies any chronic alcohol use or chronic NSAID use.  Past Medical History:  Diagnosis Date  . Chronic kidney disease    left ureteral stone  . Hypertension   . Ovarian cyst   . Seasonal allergies   . Trigeminal neuralgia     Patient Active Problem List   Diagnosis Date Noted  . Gestational diabetes 05/18/2014  . Trigeminal neuralgia 05/11/2013  . GOITER 02/10/2009  . HYPERTENSION 04/29/2007  . HEADACHE 04/29/2007    Past Surgical History:  Procedure Laterality Date  .  CESAREAN SECTION      OB History    No data available       Home Medications    Prior to Admission medications   Medication Sig Start Date End Date Taking? Authorizing Provider  amLODipine (NORVASC) 5 MG tablet TAKE 1 TABLET (5 MG TOTAL) BY MOUTH DAILY. Patient taking differently: Take 5 mg by mouth at bedtime.  05/18/14  Yes Nelwyn Salisbury, MD  EPINEPHrine (EPIPEN 2-PAK) 0.3 mg/0.3 mL IJ SOAJ injection Inject 0.3 mLs (0.3 mg total) into the muscle once. 05/18/14  Yes Nelwyn Salisbury, MD  gabapentin (NEURONTIN) 300 MG capsule Take 1 capsule (300 mg total) by mouth 3 (three) times daily. 05/18/14  Yes Nelwyn Salisbury, MD  omeprazole (PRILOSEC) 40 MG capsule Take 1 capsule (40 mg total) by mouth daily as needed. 05/18/14  Yes Nelwyn Salisbury, MD  BIOTIN PO Take 1 tablet by mouth at bedtime.    Historical Provider, MD  losartan-hydrochlorothiazide (HYZAAR) 100-25 MG per tablet TAKE 1 TABLET BY MOUTH DAILY. Patient taking differently: Take 1 tablet by mouth at bedtime.  05/18/14   Nelwyn Salisbury, MD  Multiple Vitamin (MULTIVITAMIN WITH MINERALS) TABS tablet Take 1 tablet by mouth at bedtime.    Historical Provider, MD  ondansetron (ZOFRAN ODT) 4 MG disintegrating tablet Take 1 tablet (4 mg total) by mouth every 8 (eight) hours as needed for nausea or vomiting. Patient not taking: Reported on 07/16/2016 05/18/14   Nelwyn Salisbury, MD  rizatriptan (MAXALT) 10 MG tablet Take  1 tablet (10 mg total) by mouth as needed for migraine. May repeat in 2 hours if needed Patient not taking: Reported on 07/16/2016 02/22/15   Kelle Darting, NP    Family History Family History  Problem Relation Age of Onset  . Alcohol abuse    . Arthritis    . Breast cancer    . Colon cancer    . Diabetes    . Hyperlipidemia    . Hypertension    . Prostate cancer    . Depression    . Stroke    . Sudden death    . Coronary artery disease      Social History Social History  Substance Use Topics  . Smoking status: Never  Smoker  . Smokeless tobacco: Never Used  . Alcohol use No     Allergies   Oxycodone; Shellfish allergy; Amoxicillin; and Morphine and related   Review of Systems Review of Systems  Eyes: Negative for photophobia and visual disturbance.  Respiratory: Positive for cough.   Gastrointestinal: Positive for diarrhea, nausea and vomiting. Negative for abdominal pain, blood in stool and rectal pain.  Musculoskeletal: Negative for neck pain.  Neurological: Positive for headaches.  All other systems reviewed and are negative.    Physical Exam Updated Vital Signs BP 138/86   Pulse 69   Temp 98.5 F (36.9 C) (Oral)   Resp 16   Ht 5\' 5"  (1.651 m)   Wt 76.7 kg   LMP 08/19/2012 Comment: tubes tied  SpO2 98%   BMI 28.12 kg/m   Physical Exam  Constitutional: She is oriented to person, place, and time. She appears well-developed and well-nourished. No distress.  HENT:  Head: Normocephalic and atraumatic.  Mouth/Throat: Uvula is midline, oropharynx is clear and moist and mucous membranes are normal.  Eyes: Conjunctivae and EOM are normal. Pupils are equal, round, and reactive to light.  Neck: Normal range of motion. Neck supple.  No nuchal rigidity. Full range of motion.  Cardiovascular: Normal rate, regular rhythm, normal heart sounds and intact distal pulses.   Pulmonary/Chest: Effort normal and breath sounds normal.  Abdominal: Soft. Bowel sounds are normal. She exhibits no distension. There is no tenderness. There is no rigidity, no rebound, no guarding and no CVA tenderness.  Musculoskeletal: Normal range of motion.  Moving all 4 extremities without any difficulties.  Neurological: She is alert and oriented to person, place, and time.  The patient is alert, attentive, and oriented x 3. Speech is clear. Cranial nerve II-VII grossly intact. Negative pronator drift. Sensation intact. Strength 5/5 in all extremities. Reflexes 2+ and symmetric at biceps, triceps, knees, and ankles.  Rapid alternating movement and fine finger movements intact. Romberg is absent. Posture and gait normal.   Skin: Skin is warm and dry. Capillary refill takes less than 2 seconds.  Psychiatric: She has a normal mood and affect. Judgment normal.  Nursing note and vitals reviewed.    ED Treatments / Results  DIAGNOSTIC STUDIES: Oxygen Saturation is 99% on RA, normal by my interpretation.  COORDINATION OF CARE:  11:19 PM Discussed treatment plan with pt at bedside and pt agreed to plan.  Labs (all labs ordered are listed, but only abnormal results are displayed) Labs Reviewed  COMPREHENSIVE METABOLIC PANEL - Abnormal; Notable for the following:       Result Value   Glucose, Bld 102 (*)    All other components within normal limits  CBC - Abnormal; Notable for the following:  Platelets 471 (*)    All other components within normal limits  URINALYSIS, ROUTINE W REFLEX MICROSCOPIC - Abnormal; Notable for the following:    Hgb urine dipstick MODERATE (*)    All other components within normal limits  URINALYSIS, MICROSCOPIC (REFLEX) - Abnormal; Notable for the following:    Bacteria, UA RARE (*)    Squamous Epithelial / LPF 0-5 (*)    All other components within normal limits  LIPASE, BLOOD    EKG  EKG Interpretation None       Radiology No results found.  Procedures Procedures (including critical care time)  Medications Ordered in ED Medications  ondansetron (ZOFRAN-ODT) disintegrating tablet 4 mg (4 mg Oral Given 09/15/16 2004)  sodium chloride 0.9 % bolus 1,000 mL (0 mLs Intravenous Stopped 09/16/16 0035)  prochlorperazine (COMPAZINE) injection 10 mg (10 mg Intravenous Given 09/15/16 2345)  diphenhydrAMINE (BENADRYL) injection 25 mg (25 mg Intravenous Given 09/15/16 2345)  dexamethasone (DECADRON) injection 10 mg (10 mg Intravenous Given 09/15/16 2345)     Initial Impression / Assessment and Plan / ED Course  I have reviewed the triage vital signs and the nursing  notes.  Pertinent labs & imaging results that were available during my care of the patient were reviewed by me and considered in my medical decision making (see chart for details).    Patient presents to the ED with multiple complaints. Patient has had flulike symptoms ongoing for 2 weeks. She was diagnosed with the flu last week. Patient states that her symptoms were improving until today. She endorses a mild left-sided pulsating headache. Patient denies any sudden or maximal onset. She denies any vision changes. No focal neuro deficits. Exam and history are non-concerning for [facility], ICH, meningitis or temporal arteritis. Patient is afebrile with no nuchal rigidity. Low suspicion for meningitis. History likely consistent with a migraine. Patient was given migraine cocktail with significant improvement in her symptoms. Patient also presents to the ED for hematemesis. Patient states she had 4 episodes today. Her last episode at 1400. She denies any episodes in the ED. Hemoglobin is 12. This is baseline for patient. She denies any melena or hematochezia. Abdominal exam is benign. Patient denies any chronic NSAID use or alcohol use. All other labs are unremarkable. UA shows moderate amount of hemoglobin and RBCs. Patient is currently on her menstrual cycle. Given the patient has not had any further episodes of vomiting and the patient is not tachycardic or hypotensive feel patient is safe for discharge. I have given her prescription for PPI and Carafate to take as prescribed. I encouraged her to follow-up with gastrointestinal doctor tomorrow. Encouraged her to avoid any NSAIDs or alcohol. Repeat abdominal exam is benign. On my exam patient is sleeping in room after migraine cocktail. She feels much improved. Pt is hemodynamically stable, in NAD, & able to ambulate in the ED. patient is nontoxic appearing. Pain has been managed & has no complaints prior to dc. Pt is comfortable with above plan and is stable for  discharge at this time. All questions were answered prior to disposition. Strict return precautions for f/u to the ED were discussed. Pt has been discussed with Dr. Elesa MassedWard who is agreeable to the above plan.    Final Clinical Impressions(s) / ED Diagnoses   Final diagnoses:  Hematemesis with nausea  Nonintractable headache, unspecified chronicity pattern, unspecified headache type   I personally performed the services described in this documentation, which was scribed in my presence. The recorded information has been  reviewed and is accurate.  New Prescriptions Discharge Medication List as of 09/16/2016 12:47 AM    START taking these medications   Details  sucralfate (CARAFATE) 1 GM/10ML suspension Take 10 mLs (1 g total) by mouth 4 (four) times daily -  with meals and at bedtime., Starting Mon 09/16/2016, Print         Rise Mu, PA-C 09/16/16 0117    Layla Maw Ward, DO 09/16/16 1610

## 2016-09-15 NOTE — ED Triage Notes (Signed)
Pt states she was recently tx for flu like symptoms, was feeling better today until she began vomiting around lunch time. Pt states she was vomiting bright red blood. Also endorses headache.

## 2016-09-16 MED ORDER — OMEPRAZOLE 40 MG PO CPDR: 40.0000 mg | DELAYED_RELEASE_CAPSULE | Freq: Every day | ORAL | 0 refills | Status: DC

## 2016-09-16 MED ORDER — SUCRALFATE 1 GM/10ML PO SUSP: 1.0000 g | Freq: Three times a day (TID) | ORAL | 0 refills | Status: DC

## 2016-09-16 NOTE — Discharge Instructions (Signed)
Please take the omeprazole once daily for 7 days. Take her Carafate as prescribed. Avoid NSAIDs. Avoid alcohol. Take Tylenol for fever and pain. All other labs were normal today. I have given you a referral to the gastrointestinal doctor. Please call tomorrow for an appointment. Please return to the ED if he develop any worsening fevers, neck pain, vision changes, continued to vomit blood, abdominal pain or for any other reason.

## 2016-09-18 ENCOUNTER — Ambulatory Visit (INDEPENDENT_AMBULATORY_CARE_PROVIDER_SITE_OTHER): Payer: Managed Care, Other (non HMO) | Admitting: Family Medicine

## 2016-09-18 ENCOUNTER — Encounter: Payer: Self-pay | Admitting: Family Medicine

## 2016-09-18 VITALS — BP 163/93 | HR 68 | Temp 98.5°F | Ht 65.0 in | Wt 175.0 lb

## 2016-09-18 DIAGNOSIS — I10 Essential (primary) hypertension: Secondary | ICD-10-CM

## 2016-09-18 DIAGNOSIS — N182 Chronic kidney disease, stage 2 (mild): Secondary | ICD-10-CM

## 2016-09-18 DIAGNOSIS — B349 Viral infection, unspecified: Secondary | ICD-10-CM

## 2016-09-18 DIAGNOSIS — K920 Hematemesis: Secondary | ICD-10-CM | POA: Diagnosis not present

## 2016-09-18 MED ORDER — AMLODIPINE BESYLATE 10 MG PO TABS: 10.0000 mg | ORAL_TABLET | Freq: Every day | ORAL | 3 refills | Status: DC

## 2016-09-18 MED ORDER — LOSARTAN POTASSIUM-HCTZ 100-25 MG PO TABS: ORAL_TABLET | ORAL | 3 refills | Status: DC

## 2016-09-18 NOTE — Progress Notes (Signed)
Pre visit review using our clinic review tool, if applicable. No additional management support is needed unless otherwise documented below in the visit note. 

## 2016-09-18 NOTE — Progress Notes (Signed)
   Subjective:    Patient ID: Cynthia Roach, female    DOB: [birth year], 46 y.o.   MRN: [id number]  HPI Here to follow up an ER visit on 09-15-16 for vomiting up blood 4 times. She had been dealing with flu like symptoms for a week prior to that including body aches, nausea, diarrhea, and coughing. Then that day she had 4 episodes of vomiting up either dark red or bright red blood. No abdominal pains. Her BMs have been normal. She was prescribed Omeprazole 40 mg daily and Carafate slurry (although she could not start the Carafate because it is on back order). She has had no further nausea or vomiting since that day. In fact she feels totally back to normal now. Her labs that day showed normal kidney function. She has noticed that her BP has been elevated for a few months.    Review of Systems  Constitutional: Negative.   Respiratory: Negative.   Cardiovascular: Negative.   Gastrointestinal: Negative.   Genitourinary: Negative.   Neurological: Negative.        Objective:   Physical Exam  Constitutional: She is oriented to person, place, and time. She appears well-developed and well-nourished. No distress.  Cardiovascular: Normal rate, regular rhythm, normal heart sounds and intact distal pulses.   Pulmonary/Chest: Effort normal and breath sounds normal.  Abdominal: Soft. Bowel sounds are normal. She exhibits no distension and no mass. There is no tenderness. There is no rebound and no guarding.  Musculoskeletal: She exhibits no edema.  Neurological: She is alert and oriented to person, place, and time.          Assessment & Plan:  Her viral illness appears to have resolved. Her hematemesis was probably from some gastritis and this has resolved. She will remain on Omeprazole for the time being. For the HTN we will increase Amlodipine to 10 mg daily. Her renal status is stable.  Gershon CraneStephen Takesha Steger, MD

## 2017-04-14 ENCOUNTER — Ambulatory Visit (INDEPENDENT_AMBULATORY_CARE_PROVIDER_SITE_OTHER): Payer: Managed Care, Other (non HMO) | Admitting: Family Medicine

## 2017-04-14 ENCOUNTER — Encounter: Payer: Self-pay | Admitting: Family Medicine

## 2017-04-14 VITALS — BP 150/92 | HR 96 | Temp 98.3°F | Ht 65.0 in | Wt 184.6 lb

## 2017-04-14 DIAGNOSIS — G500 Trigeminal neuralgia: Secondary | ICD-10-CM

## 2017-04-14 MED ORDER — METHYLPREDNISOLONE ACETATE 40 MG/ML INJ SUSP (RADIOLOG
120.0000 mg | Freq: Once | INTRAMUSCULAR | Status: DC
Start: 2017-04-14 — End: 2017-04-14

## 2017-04-14 MED ORDER — METHYLPREDNISOLONE 4 MG PO TBPK: ORAL_TABLET | ORAL | 0 refills | Status: DC

## 2017-04-14 MED ORDER — METHYLPREDNISOLONE ACETATE 80 MG/ML IJ SUSP
120.0000 mg | Freq: Once | INTRAMUSCULAR | Status: AC
  Administered 2017-04-14: 120 mg via INTRAMUSCULAR

## 2017-04-14 NOTE — Progress Notes (Signed)
   Subjective:    Patient ID: Cynthia Roach, female    DOB: July 18, 1971, 46 y.o.   MRN: [id number]  HPI Here for a return of trigeminal neuralgia. She has not had a flare of this in over 3 years. Now for the past 6 days she has had swelling in the left cheek area and she has a burning pain in the left temple and cheek areas. No visible rash. No fever. She had been off Gabapentin for several years but when this started she resumed taking this 300 mg TID. No vision changes.    Review of Systems  Constitutional: Negative.   HENT: Positive for facial swelling. Negative for congestion, postnasal drip, sinus pain, sinus pressure and sore throat.   Eyes: Negative.   Cardiovascular: Negative.   Neurological: Positive for headaches.       Objective:   Physical Exam  Constitutional: She appears well-developed and well-nourished.  HENT:  Right Ear: External ear normal.  Left Ear: External ear normal.  Nose: Nose normal.  Mouth/Throat: Oropharynx is clear and moist.  Mild swelling over the left cheek   Eyes: Pupils are equal, round, and reactive to light. Conjunctivae and EOM are normal.  Neck: Normal range of motion. Neck supple.  Cardiovascular: Normal rate, regular rhythm, normal heart sounds and intact distal pulses.   Pulmonary/Chest: Effort normal and breath sounds normal. No respiratory distress. She has no wheezes. She has no rales.  Skin: No rash noted. No erythema.          Assessment & Plan:  Trigeminal neuralgia. She will continue to take Gabapentin. Given a steroid shot today to be followed by a Medrol dose pack beginning tomorrow.  Gershon Crane, MD

## 2017-04-14 NOTE — Addendum Note (Signed)
Addended by: Trudi Ida on: 04/14/2017 04:43 PM   Modules accepted: Orders

## 2017-05-15 ENCOUNTER — Encounter: Payer: Self-pay | Admitting: Family Medicine

## 2017-06-10 ENCOUNTER — Encounter: Payer: Self-pay | Admitting: Family Medicine

## 2017-06-10 ENCOUNTER — Ambulatory Visit (INDEPENDENT_AMBULATORY_CARE_PROVIDER_SITE_OTHER): Payer: Managed Care, Other (non HMO) | Admitting: Family Medicine

## 2017-06-10 VITALS — BP 128/84 | Temp 98.3°F | Ht 65.0 in | Wt 181.0 lb

## 2017-06-10 DIAGNOSIS — L0292 Furuncle, unspecified: Secondary | ICD-10-CM | POA: Diagnosis not present

## 2017-06-10 MED ORDER — DOXYCYCLINE HYCLATE 100 MG PO CAPS: 100.0000 mg | ORAL_CAPSULE | Freq: Two times a day (BID) | ORAL | 0 refills | Status: AC

## 2017-06-10 NOTE — Progress Notes (Signed)
   Subjective:    Patient ID: Cynthia Roach, female    DOB: 1971/04/30, 46 y.o.   MRN: [id number]  HPI Here for 2 months of painful boils under the skin on her face. The right side is more involved than the left. No other areas of her body are involved. No fever. The last time she had anything like  This was about 8 years ago.    Review of Systems  Constitutional: Negative.   Respiratory: Negative.   Cardiovascular: Negative.   Skin: Positive for wound.       Objective:   Physical Exam  Constitutional: She appears well-developed and well-nourished.  Neck: Neck supple. No thyromegaly present.  Cardiovascular: Normal rate, regular rhythm, normal heart sounds and intact distal pulses.   Pulmonary/Chest: Effort normal and breath sounds normal. No respiratory distress. She has no wheezes. She has no rales.  Lymphadenopathy:    She has no cervical adenopathy.  Skin:  Multiple cystic tender lesions on the right forehead, temple, and cheek. Several smaller ones on the left cheek           Assessment & Plan:  Boils, possibly due to MRSA. Treat with Doxycycline for 30 days. Recheck prn.  Gershon Crane, MD

## 2017-06-10 NOTE — Patient Instructions (Signed)
WE NOW OFFER   [facility] FAST TRACK!!!  SAME DAY Appointments for ACUTE CARE  Such as: Sprains, Injuries, cuts, abrasions, rashes, muscle pain, joint pain, back pain Colds, flu, sore throats, headache, allergies, cough, fever  Ear pain, sinus and eye infections Abdominal pain, nausea, vomiting, diarrhea, upset stomach Animal/insect bites  3 Easy Ways to Schedule: Walk-In Scheduling Call in scheduling Mychart Sign-up: [email or web address]         

## 2017-07-28 ENCOUNTER — Ambulatory Visit: Payer: Managed Care, Other (non HMO) | Admitting: Family Medicine

## 2017-07-28 ENCOUNTER — Encounter: Payer: Self-pay | Admitting: Family Medicine

## 2017-07-28 VITALS — BP 126/60 | HR 80 | Temp 98.6°F | Wt 186.4 lb

## 2017-07-28 DIAGNOSIS — L0293 Carbuncle, unspecified: Secondary | ICD-10-CM | POA: Diagnosis not present

## 2017-07-28 MED ORDER — DOXYCYCLINE HYCLATE 100 MG PO TABS: 100.0000 mg | ORAL_TABLET | Freq: Two times a day (BID) | ORAL | 1 refills | Status: DC

## 2017-07-28 MED ORDER — GABAPENTIN 300 MG PO CAPS: 300.0000 mg | ORAL_CAPSULE | Freq: Three times a day (TID) | ORAL | 3 refills | Status: DC

## 2017-07-28 MED ORDER — AMLODIPINE BESYLATE 10 MG PO TABS: 10.0000 mg | ORAL_TABLET | Freq: Every day | ORAL | 3 refills | Status: DC

## 2017-07-28 NOTE — Progress Notes (Signed)
   Subjective:    Patient ID: Cynthia Roach, female    DOB: 1971/01/21, 46 y.o.   MRN: [id number]  HPI Here for a recurrence of boils on the face. We saw her for these in October and she took Doxycyline for 30 days. This worked very well, but they came back when she stopped the antibiotic. No fevers.    Review of Systems  Constitutional: Negative.   Respiratory: Negative.   Cardiovascular: Negative.        Objective:   Physical Exam  Constitutional: She appears well-developed and well-nourished.  Neck: No thyromegaly present.  Cardiovascular: Normal rate, regular rhythm, normal heart sounds and intact distal pulses.  Pulmonary/Chest: Effort normal and breath sounds normal. No respiratory distress. She has no wheezes. She has no rales.  Lymphadenopathy:    She has no cervical adenopathy.  Skin:  Multiple small tender pustules under the skin on the face           Assessment & Plan:  Facial boils. She will take Doxycycline for a period of 6 months. Recheck prn.  Gershon CraneStephen Fry, MD

## 2017-12-30 ENCOUNTER — Telehealth: Payer: Self-pay | Admitting: General Practice

## 2017-12-30 NOTE — Telephone Encounter (Signed)
Called pt regarding her request to establish new care with our office. LVM informing that we have four providers accepting new patients and advised to call back and schedule a new patient visit.

## 2018-01-06 ENCOUNTER — Telehealth: Payer: Self-pay

## 2018-01-06 NOTE — Telephone Encounter (Signed)
ok 

## 2018-01-06 NOTE — Telephone Encounter (Signed)
Dr. Copland- would you be willing to accept Pt?  

## 2018-01-06 NOTE — Telephone Encounter (Signed)
Copied from CRM [id number]. Topic: Appointment Scheduling - Scheduling Inquiry for Clinic >> Jan 06, 2018  3:41 PM Oneal Grout wrote: Reason for CRM: Requesting to switch from Dr Clent Ridges to [facility], closer to home. Please advise >> Jan 06, 2018  4:18 PM Nimmons, Amie Critchley, RN wrote: [facility] per Dr. Clent Ridges to transfer.

## 2018-01-07 NOTE — Telephone Encounter (Signed)
Called pt and informed of the approval of there transfer. Informed that it could be several weeks before they could be schedule for a transfer of care. Advised to call back and schedule NP appts.

## 2018-01-07 NOTE — Telephone Encounter (Signed)
Please call Pt and schedule transfer of care appt w/ Dr. Patsy Lager at her convenience. Thank you.

## 2018-01-14 NOTE — Telephone Encounter (Signed)
Copied from CRM [id number]. Topic: Appointment Scheduling - Scheduling Inquiry for Clinic >> Jan 14, 2018  8:13 AM Eston Mould B wrote: Reason for CRM   pt wants to schedule transfer of care apt from [place] [facility] [place]  to  a pcp at [facility],  herself and 2 other family members    she can only come in at 7am or after 530 pm    Looking at [employer] and schedule  I was unsure of scheduling in [place]  or [facility]  because both say no new pts after 3,  also was unsure if I could schedule toc on same day  Melissa already has her limit of new pts, called office for clarification and it was suggested I send crm.   Told pt the office would call her back to get her scheduled  [phone]

## 2018-07-01 ENCOUNTER — Ambulatory Visit: Payer: 59 | Admitting: Family Medicine

## 2018-07-02 ENCOUNTER — Ambulatory Visit: Payer: 59 | Admitting: Family Medicine

## 2018-07-02 ENCOUNTER — Encounter: Payer: Self-pay | Admitting: Family Medicine

## 2018-07-02 VITALS — BP 170/90 | HR 84 | Temp 97.8°F | Wt 185.4 lb

## 2018-07-02 DIAGNOSIS — G500 Trigeminal neuralgia: Secondary | ICD-10-CM

## 2018-07-02 MED ORDER — METHYLPREDNISOLONE 4 MG PO TBPK: ORAL_TABLET | ORAL | 0 refills | Status: DC

## 2018-07-02 MED ORDER — METHYLPREDNISOLONE ACETATE 80 MG/ML IJ SUSP
80.0000 mg | Freq: Once | INTRAMUSCULAR | Status: AC
  Administered 2018-07-02: 120 mg via INTRAMUSCULAR

## 2018-07-02 MED ORDER — GABAPENTIN 300 MG PO CAPS: 300.0000 mg | ORAL_CAPSULE | Freq: Three times a day (TID) | ORAL | 3 refills | Status: DC

## 2018-07-02 NOTE — Progress Notes (Signed)
   Subjective:    Patient ID: Cynthia Roach, female    DOB: 06-05-1971, 47 y.o.   MRN: [id number]  HPI Here for another flare of her trigeminal neuralgia. This is the first one she has had for over one year. It started 5 days ago and is getting worse. She feels a burning pain and tingling on the right side of her face, especially the cheek and upper neck. She has not been taking Gabapentin for the past 6 months.   Review of Systems  Constitutional: Negative.   HENT: Negative.   Eyes: Negative.   Respiratory: Negative.   Neurological: Negative for weakness and numbness.       Objective:   Physical Exam  Constitutional: She appears well-developed and well-nourished.  HENT:  Right Ear: External ear normal.  Left Ear: External ear normal.  Nose: Nose normal.  Mouth/Throat: Oropharynx is clear and moist.  Eyes: Conjunctivae are normal.  Neck: No thyromegaly present.  Cardiovascular: Normal rate, regular rhythm, normal heart sounds and intact distal pulses.  Pulmonary/Chest: Effort normal and breath sounds normal.  Lymphadenopathy:    She has no cervical adenopathy.  Neurological: She is alert. No cranial nerve deficit.          Assessment & Plan:  Trigeminal neuralgia. Given a steroid shot to be followed by a Medrol pack. She will also start back on Gabapentin. Gershon Crane, MD

## 2018-08-14 ENCOUNTER — Telehealth: Payer: Self-pay | Admitting: *Deleted

## 2018-08-14 MED ORDER — DOXYCYCLINE HYCLATE 100 MG PO TABS: 100.0000 mg | ORAL_TABLET | Freq: Two times a day (BID) | ORAL | 0 refills | Status: DC

## 2018-08-14 NOTE — Telephone Encounter (Signed)
Dr. Fry please advise. Thanks  

## 2018-08-14 NOTE — Telephone Encounter (Signed)
Called and spoke with pt and she is aware of rx for doxy that has been sent to [facility].

## 2018-08-14 NOTE — Telephone Encounter (Signed)
Copied from CRM [id number]. Topic: General - Other >> Aug 14, 2018 10:43 AM Percival SpanishKennedy, Cheryl W wrote:  Pt call to say she was given a shot for trigeminal neuralgia and was told if if did not work to call and the doctor would call her in a antibiotic    Ph[facility]

## 2018-08-14 NOTE — Telephone Encounter (Signed)
Call in Doxycycline 100 mg bid for 10 days  

## 2018-08-24 ENCOUNTER — Encounter: Payer: Self-pay | Admitting: Family Medicine

## 2018-08-24 ENCOUNTER — Ambulatory Visit: Payer: 59 | Admitting: Family Medicine

## 2018-08-24 VITALS — BP 162/110 | HR 87 | Temp 98.5°F | Wt 182.2 lb

## 2018-08-24 DIAGNOSIS — L03211 Cellulitis of face: Secondary | ICD-10-CM

## 2018-08-24 MED ORDER — CEFTRIAXONE SODIUM 1 G IJ SOLR
1.0000 g | Freq: Once | INTRAMUSCULAR | Status: AC
  Administered 2018-08-24: 1 g via INTRAMUSCULAR

## 2018-08-24 MED ORDER — CEPHALEXIN 500 MG PO CAPS: 500.0000 mg | ORAL_CAPSULE | Freq: Four times a day (QID) | ORAL | 0 refills | Status: DC

## 2018-08-24 NOTE — Addendum Note (Signed)
Addended by: Marcellus ScottADKINS, CONNIE L on: 08/24/2018 03:52 PM   Modules accepted: Orders

## 2018-08-24 NOTE — Progress Notes (Signed)
   Subjective:    Patient ID: Cynthia Roach, female    DOB: [birth year], 47 y.o.   MRN: [id number]  HPI Here for 10 days of pain on the left side of the face, and now with 5 days of swelling and a rash on the left cheek. The cheek is painful and it has been draining purulent material. No fevers. She took a course of Doxycycline with no effect.    Review of Systems  Constitutional: Negative.   HENT: Positive for facial swelling.   Respiratory: Negative.   Cardiovascular: Negative.   Skin: Positive for rash.       Objective:   Physical Exam Constitutional:      Appearance: Normal appearance.  HENT:     Head:     Comments: The left cheek area is swollen, red, covered with excoriated lesions, and very tender.     Right Ear: Tympanic membrane and ear canal normal.     Left Ear: Tympanic membrane and ear canal normal.     Nose: Nose normal.     Mouth/Throat:     Pharynx: Oropharynx is clear.  Eyes:     Extraocular Movements: Extraocular movements intact.     Conjunctiva/sclera: Conjunctivae normal.     Pupils: Pupils are equal, round, and reactive to light.  Neck:     Comments: The left AC nodes are enlarged and tender  Pulmonary:     Effort: Pulmonary effort is normal.     Breath sounds: Normal breath sounds.  Neurological:     Mental Status: She is alert.           Assessment & Plan:  Cellulitis, given a shot of Rocephin. This will be followed by Keflex 500 mg QID. Recheck here later this week. Written out of work today until 08-31-18.  Gershon CraneStephen Maribeth Jiles, MD

## 2018-08-28 ENCOUNTER — Encounter: Payer: Self-pay | Admitting: Family Medicine

## 2018-08-28 ENCOUNTER — Ambulatory Visit: Payer: 59 | Admitting: Family Medicine

## 2018-08-28 VITALS — BP 172/118 | HR 72 | Temp 98.3°F | Wt 184.5 lb

## 2018-08-28 DIAGNOSIS — N182 Chronic kidney disease, stage 2 (mild): Secondary | ICD-10-CM | POA: Diagnosis not present

## 2018-08-28 DIAGNOSIS — I10 Essential (primary) hypertension: Secondary | ICD-10-CM | POA: Diagnosis not present

## 2018-08-28 DIAGNOSIS — L03211 Cellulitis of face: Secondary | ICD-10-CM

## 2018-08-28 DIAGNOSIS — R739 Hyperglycemia, unspecified: Secondary | ICD-10-CM

## 2018-08-28 LAB — BASIC METABOLIC PANEL
BUN: 9 mg/dL (ref 6–23)
CO2: 28 mEq/L (ref 19–32)
Calcium: 9.4 mg/dL (ref 8.4–10.5)
Chloride: 103 mEq/L (ref 96–112)
Creatinine, Ser: 0.93 mg/dL (ref 0.40–1.20)
GFR: 83.42 mL/min (ref >60.00)
GLUCOSE: 96 mg/dL (ref 70–99)
Potassium: 3.9 mEq/L (ref 3.5–5.1)
Sodium: 139 mEq/L (ref 135–145)

## 2018-08-28 LAB — HEPATIC FUNCTION PANEL
ALT: 17 U/L (ref 0–35)
AST: 16 U/L (ref 0–37)
Albumin: 3.8 g/dL (ref 3.5–5.2)
Alkaline Phosphatase: 103 U/L (ref 39–117)
Bilirubin, Direct: 0.1 mg/dL (ref 0.0–0.3)
Total Bilirubin: 0.5 mg/dL (ref 0.2–1.2)
Total Protein: 7 g/dL (ref 6.0–8.3)

## 2018-08-28 LAB — CBC WITH DIFFERENTIAL/PLATELET
Basophils Absolute: 0 10*3/uL (ref 0.0–0.1)
Basophils Relative: 0.5 % (ref 0.0–3.0)
Eosinophils Absolute: 0 10*3/uL (ref 0.0–0.7)
Eosinophils Relative: 0.5 % (ref 0.0–5.0)
HCT: 38 % (ref 36.0–46.0)
Hemoglobin: 12.6 g/dL (ref 12.0–15.0)
Lymphocytes Relative: 18.7 % (ref 12.0–46.0)
Lymphs Abs: 1.7 10*3/uL (ref 0.7–4.0)
MCHC: 33.2 g/dL (ref 30.0–36.0)
MCV: 92.7 fl (ref 78.0–100.0)
Monocytes Absolute: 0.7 10*3/uL (ref 0.1–1.0)
Monocytes Relative: 7.2 % (ref 3.0–12.0)
NEUTROS PCT: 73.1 % (ref 43.0–77.0)
Neutro Abs: 6.7 10*3/uL (ref 1.4–7.7)
Platelets: 300 10*3/uL (ref 150.0–400.0)
RBC: 4.1 Mil/uL (ref 3.87–5.11)
RDW: 13.6 % (ref 11.5–15.5)
WBC: 9.1 10*3/uL (ref 4.0–10.5)

## 2018-08-28 LAB — TSH: TSH: 0.44 u[IU]/mL (ref 0.35–4.50)

## 2018-08-28 LAB — HEMOGLOBIN A1C: Hgb A1c MFr Bld: 6 % (ref 4.6–6.5)

## 2018-08-28 MED ORDER — LOSARTAN POTASSIUM 50 MG PO TABS: 50.0000 mg | ORAL_TABLET | Freq: Every day | ORAL | 3 refills | Status: DC

## 2018-08-28 MED ORDER — CEFTRIAXONE SODIUM 1 G IJ SOLR
1.0000 g | Freq: Once | INTRAMUSCULAR | Status: AC
  Administered 2018-08-28: 1 g via INTRAMUSCULAR

## 2018-08-28 NOTE — Addendum Note (Signed)
Addended by: Marcellus Scott on: 08/28/2018 01:04 PM   Modules accepted: Orders

## 2018-08-28 NOTE — Progress Notes (Signed)
   Subjective:    Patient ID: Cynthia Roach, female    DOB: 1971-08-22, 48 y.o.   MRN: [id number]  HPI Here to follow up on a cellulitis on the left cheek. We saw her earlier this week and gave her a shot of Rocephin. She has also been taking Keflex QID. We noted her BP was elevated at the last visit, and this is even higher today. The swelling and pain in her cheek have improved, but she feels bad in general with fatigue and poor appetite. No fever.    Review of Systems  Constitutional: Positive for fatigue. Negative for fever.  Respiratory: Negative.   Cardiovascular: Negative.   Gastrointestinal: Negative.   Skin: Positive for rash.  Neurological: Negative.        Objective:   Physical Exam Constitutional:      Appearance: Normal appearance. She is ill-appearing.  HENT:     Head:     Comments: Her left cheek is less swollen and less tender today. It is fluctuant and not firm like before  Neck:     Musculoskeletal: Normal range of motion.     Comments: The left sided AC nodes are much less swollen and less tender from before  Cardiovascular:     Rate and Rhythm: Normal rate and regular rhythm.     Pulses: Normal pulses.     Heart sounds: Normal heart sounds.  Pulmonary:     Effort: Pulmonary effort is normal.     Breath sounds: Normal breath sounds.  Neurological:     Mental Status: She is alert.           Assessment & Plan:  The facial cellulitis is responding to treatment. Given another Rocephin shot and she will stay on Keflex. We will start her on Losartan to help get the BP down. Check labs today to include renal function and an A1c. Recheck here next week.  Gershon Crane, MD

## 2018-09-01 ENCOUNTER — Encounter: Payer: Self-pay | Admitting: *Deleted

## 2018-09-02 ENCOUNTER — Ambulatory Visit: Payer: 59 | Admitting: Family Medicine

## 2018-09-02 ENCOUNTER — Encounter: Payer: Self-pay | Admitting: Family Medicine

## 2018-09-02 VITALS — BP 144/102 | HR 65 | Temp 98.3°F | Wt 185.2 lb

## 2018-09-02 DIAGNOSIS — I10 Essential (primary) hypertension: Secondary | ICD-10-CM | POA: Diagnosis not present

## 2018-09-02 DIAGNOSIS — L03211 Cellulitis of face: Secondary | ICD-10-CM

## 2018-09-02 MED ORDER — CEPHALEXIN 500 MG PO CAPS: 500.0000 mg | ORAL_CAPSULE | Freq: Four times a day (QID) | ORAL | 0 refills | Status: DC

## 2018-09-02 MED ORDER — LOSARTAN POTASSIUM-HCTZ 100-25 MG PO TABS: 1.0000 | ORAL_TABLET | Freq: Every day | ORAL | 3 refills | Status: DC

## 2018-09-02 NOTE — Progress Notes (Signed)
   Subjective:    Patient ID: Cynthia Roach, female    DOB: February 03, 1971, 48 y.o.   MRN: [id number]  HPI Here to recheck a cellulitis around the left facial area and her BP. She has been taking Keflex for about 3 weeks, and the cellulitis has been slowly improving. The swelling is going down, and the pain has greatly diminished. The neck nodes are less swollen. No fever. Her BP has been coming down as well after we added Losartan 50 mg to her regimen. We did labs at her last visit and all these were within normal limits.    Review of Systems  Constitutional: Negative.   HENT: Positive for facial swelling. Negative for postnasal drip, sinus pressure, sinus pain, sore throat and trouble swallowing.   Eyes: Negative.   Respiratory: Negative.   Cardiovascular: Negative.        Objective:   Physical Exam Constitutional:      Appearance: Normal appearance.  Neck:     Comments: Shotty AC nodes which are slightly tender, but these are loss prominent than at her last visit  Cardiovascular:     Rate and Rhythm: Normal rate and regular rhythm.     Pulses: Normal pulses.     Heart sounds: Normal heart sounds.  Pulmonary:     Effort: Pulmonary effort is normal.     Breath sounds: Normal breath sounds.  Neurological:     Mental Status: She is alert.           Assessment & Plan:  Her HTN is improving but we will stop Losartan and change to Losartan HCT 100-25 to take daily. The cellulitis is slowly improving as well so we will continue the Keflex and see her back in 2 weeks. Written out of work until 09-21-18.  Gershon Crane, MD

## 2018-09-14 ENCOUNTER — Ambulatory Visit: Payer: 59 | Admitting: Family Medicine

## 2018-09-16 ENCOUNTER — Ambulatory Visit: Payer: 59 | Admitting: Family Medicine

## 2018-09-17 ENCOUNTER — Encounter: Payer: Self-pay | Admitting: Family Medicine

## 2018-09-17 ENCOUNTER — Ambulatory Visit: Payer: 59 | Admitting: Family Medicine

## 2018-09-17 VITALS — BP 170/110 | HR 84 | Temp 98.0°F | Wt 185.2 lb

## 2018-09-17 DIAGNOSIS — L03211 Cellulitis of face: Secondary | ICD-10-CM

## 2018-09-17 MED ORDER — CLINDAMYCIN HCL 300 MG PO CAPS: 300.0000 mg | ORAL_CAPSULE | Freq: Four times a day (QID) | ORAL | 0 refills | Status: DC

## 2018-09-17 NOTE — Progress Notes (Signed)
   Subjective:    Patient ID: Cynthia Roach, female    DOB: Jan 04, 1971, 48 y.o.   MRN: [id number]  HPI Here for cellulitis on the face. She has been dealing with this for about 4-5 weeks. It was centered on the left side of the face at first. She did not respond to Doxycycline so we switched her to Keflex. This seemed to help at first but now the rash has also spread to the right side of the face. This is painful. No fever.    Review of Systems  Constitutional: Negative.   HENT: Positive for facial swelling.   Respiratory: Negative.   Cardiovascular: Negative.   Skin: Positive for rash.       Objective:   Physical Exam Constitutional:      Appearance: Normal appearance.  HENT:     Right Ear: Tympanic membrane and ear canal normal.     Left Ear: Tympanic membrane and ear canal normal.     Nose: Nose normal.     Mouth/Throat:     Pharynx: Oropharynx is clear.  Eyes:     Conjunctiva/sclera: Conjunctivae normal.  Pulmonary:     Effort: Pulmonary effort is normal.     Breath sounds: Normal breath sounds.  Lymphadenopathy:     Cervical: No cervical adenopathy.  Skin:    Comments: The skin on both cheeks is warm, red and quite tender. This is mostly macular but there are a few papules, no vesicles            Assessment & Plan:  Cellulitis of the face. I am still concerned about possible MRSA. We will stop Keflex and start on Clindamycin 300 mg QID. We will also have her see Dermatology asap.  Gershon Crane, MD

## 2018-10-07 ENCOUNTER — Encounter: Payer: Self-pay | Admitting: Family Medicine

## 2018-10-07 ENCOUNTER — Telehealth: Payer: Self-pay | Admitting: *Deleted

## 2018-10-07 ENCOUNTER — Ambulatory Visit: Payer: 59 | Admitting: Family Medicine

## 2018-10-07 VITALS — BP 128/84 | HR 77 | Temp 98.8°F | Ht 65.0 in | Wt 183.8 lb

## 2018-10-07 DIAGNOSIS — L03211 Cellulitis of face: Secondary | ICD-10-CM | POA: Diagnosis not present

## 2018-10-07 MED ORDER — CEPHALEXIN 500 MG PO CAPS: 500.0000 mg | ORAL_CAPSULE | Freq: Four times a day (QID) | ORAL | 0 refills | Status: DC

## 2018-10-07 NOTE — Telephone Encounter (Signed)
Per Dr Clent Ridges he scheduled the pt an appt with [facility] to see Vernie Murders on 2/18 at 11am.  I called the pt, informed her of this and faxed office notes to [phone].

## 2018-10-07 NOTE — Progress Notes (Signed)
   Subjective:    Patient ID: Cynthia Roach, female    DOB: [birth year], 48 y.o.   MRN: [id number]  HPI Here for cellulitis on the face which started about 3 months ago. She has taken courses of Doxycycline, then Keflex, and then most recently Clindamycin, but the rash persists. It is tender and causes her neck nodes to swell. No fever. She has an appt with [facility] but not until May 14.    Review of Systems  Constitutional: Negative.   HENT: Positive for facial swelling.   Respiratory: Negative.   Cardiovascular: Negative.   Skin: Positive for rash.       Objective:   Physical Exam Constitutional:      Appearance: She is not ill-appearing.  HENT:     Right Ear: Tympanic membrane and ear canal normal.     Left Ear: Tympanic membrane and ear canal normal.     Nose: Nose normal.     Mouth/Throat:     Pharynx: Oropharynx is clear.  Eyes:     Conjunctiva/sclera: Conjunctivae normal.  Pulmonary:     Effort: Pulmonary effort is normal.     Breath sounds: Normal breath sounds.  Lymphadenopathy:     Cervical: Cervical adenopathy present.  Skin:    Comments: Diffuse maculopapular rash over the forehead and both cheeks .  Neurological:     Mental Status: She is alert.           Assessment & Plan:  Facial cellulitis. We will get her back on Keflex. Refer to Dermatology asap.  Gershon Crane, MD

## 2018-10-30 ENCOUNTER — Other Ambulatory Visit: Payer: Self-pay | Admitting: Family Medicine

## 2019-04-30 LAB — HM MAMMOGRAPHY

## 2019-05-05 ENCOUNTER — Encounter: Payer: Self-pay | Admitting: Family Medicine

## 2019-10-05 DIAGNOSIS — R0602 Shortness of breath: Secondary | ICD-10-CM | POA: Insufficient documentation

## 2019-10-05 DIAGNOSIS — Z780 Asymptomatic menopausal state: Secondary | ICD-10-CM | POA: Insufficient documentation

## 2019-10-05 DIAGNOSIS — R002 Palpitations: Secondary | ICD-10-CM | POA: Insufficient documentation

## 2020-02-17 ENCOUNTER — Other Ambulatory Visit: Payer: Self-pay

## 2020-02-17 ENCOUNTER — Emergency Department (HOSPITAL_BASED_OUTPATIENT_CLINIC_OR_DEPARTMENT_OTHER)
Admission: EM | Admit: 2020-02-17 | Discharge: 2020-02-17 | Disposition: A | Discharge status: Home / Self Care | Payer: 59 | Attending: Emergency Medicine | Admitting: Emergency Medicine

## 2020-02-17 ENCOUNTER — Encounter (HOSPITAL_BASED_OUTPATIENT_CLINIC_OR_DEPARTMENT_OTHER): Payer: Self-pay | Admitting: Emergency Medicine

## 2020-02-17 DIAGNOSIS — N189 Chronic kidney disease, unspecified: Secondary | ICD-10-CM | POA: Insufficient documentation

## 2020-02-17 DIAGNOSIS — H11421 Conjunctival edema, right eye: Secondary | ICD-10-CM | POA: Diagnosis not present

## 2020-02-17 DIAGNOSIS — R220 Localized swelling, mass and lump, head: Secondary | ICD-10-CM | POA: Diagnosis present

## 2020-02-17 DIAGNOSIS — I129 Hypertensive chronic kidney disease with stage 1 through stage 4 chronic kidney disease, or unspecified chronic kidney disease: Secondary | ICD-10-CM | POA: Diagnosis not present

## 2020-02-17 MED ORDER — OFLOXACIN 0.3 % OP SOLN
1.0000 [drp] | OPHTHALMIC | Status: DC
  Administered 2020-02-17: 1 [drp] via OPHTHALMIC
  Filled 2020-02-17: qty 5

## 2020-02-17 MED ORDER — TETRACAINE HCL 0.5 % OP SOLN
2.0000 [drp] | Freq: Once | OPHTHALMIC | Status: AC
  Administered 2020-02-17: 2 [drp] via OPHTHALMIC
  Filled 2020-02-17: qty 4

## 2020-02-17 NOTE — ED Provider Notes (Signed)
Periorbital swelling that started yesterday evening.  [place] [facility] Provider Note: Georgena Spurling, MD, FACEP  CSN: [id number] MRN: [id number] ARRIVAL: 02/17/20 at [place]: [name]  Facial Swelling   HISTORY OF PRESENT ILLNESS  02/17/20 4:38 AM Cynthia Roach is a 49 y.o. female with right right conjunctival swelling since yesterday evening.  There is no associated erythema or drainage.  She is not aware of any injury or exposure to an allergen.  She rates associated pain is a 2 out of 10, aching in nature.  She has a cosmetic contact lens in place which she was not able to remove.   Past Medical History:  Diagnosis Date   Chronic kidney disease    left ureteral stone   Hypertension    Ovarian cyst    Seasonal allergies    Trigeminal neuralgia     Past Surgical History:  Procedure Laterality Date   CESAREAN SECTION      Family History  Problem Relation Age of Onset   Alcohol abuse Other    Arthritis Other    Breast cancer Other    Colon cancer Other    Diabetes Other    Hyperlipidemia Other    Hypertension Other    Prostate cancer Other    Depression Other    Stroke Other    Sudden death Other    Coronary artery disease Other     Social History   Tobacco Use   Smoking status: Never Smoker   Smokeless tobacco: Never Used  Substance Use Topics   Alcohol use: No    Alcohol/week: 0.0 standard drinks   Drug use: No    Prior to Admission medications   Medication Sig Start Date End Date Taking? Authorizing Provider  amLODipine (NORVASC) 10 MG tablet take 1 tablet by mouth daily 10/30/18   Laurey Morale, MD  cephALEXin (KEFLEX) 500 MG capsule Take 1 capsule (500 mg total) by mouth 4 (four) times daily. 10/07/18   Laurey Morale, MD  EPINEPHrine (EPIPEN 2-PAK) 0.3 mg/0.3 mL IJ SOAJ injection Inject 0.3 mLs (0.3 mg total) into the muscle once. 05/18/14   Laurey Morale, MD  gabapentin (NEURONTIN) 300 MG capsule  Take 1 capsule (300 mg total) by mouth 3 (three) times daily. 07/02/18   Laurey Morale, MD  losartan-hydrochlorothiazide (HYZAAR) 100-25 MG tablet Take 1 tablet by mouth daily. 09/02/18   Laurey Morale, MD    Allergies Oxycodone, Shellfish allergy, Amoxicillin, Lisinopril, and Morphine and related   REVIEW OF SYSTEMS  Negative except as noted here or in the History of Present Illness.   PHYSICAL EXAMINATION  Initial Vital Signs Blood pressure (!) 158/99, pulse 74, temperature 98.1 F (36.7 C), temperature source Oral, resp. rate 16, height 5\' 5"  (1.651 m), weight 83.4 kg, last menstrual period 01/31/2020, SpO2 99 %.  Examination General: Well-developed, well-nourished female in no acute distress; appearance consistent with age of record HENT: normocephalic; atraumatic Eyes: Extraocular muscles intact; cosmetic contact lenses in place; chemosis of the right bulbar conjunctivae:    Neck: supple Heart: regular rate and rhythm Lungs: clear to auscultation bilaterally Abdomen: soft; nondistended; nontender; bowel sounds present Extremities: No deformity; full range of motion Neurologic: Awake, alert and oriented; motor function intact in all extremities and symmetric; no facial droop Skin: Warm and dry Psychiatric: Normal mood and affect   RESULTS  Summary of this visit's results, reviewed and interpreted by myself:   EKG Interpretation  Date/Time:  Ventricular Rate:    PR Interval:    QRS Duration:   QT Interval:    QTC Calculation:   R Axis:     Text Interpretation:        Laboratory Studies: No results found for this or any previous visit (from the past 24 hour(s)). Imaging Studies: No results found.  ED COURSE and MDM  Nursing notes, initial and subsequent vitals signs, including pulse oximetry, reviewed and interpreted by myself.  Vitals:   02/17/20 0422 02/17/20 0425  BP:  (!) 158/99  Pulse:  74  Resp:  16  Temp:  98.1 F (36.7 C)  TempSrc:  Oral    SpO2:  99%  Weight: 83.4 kg   Height: 5\' 5"  (1.651 m)    Medications  tetracaine (PONTOCAINE) 0.5 % ophthalmic solution 2 drop (has no administration in time range)  ofloxacin (OCUFLOX) 0.3 % ophthalmic solution 1 drop (has no administration in time range)    We will start the patient on antibiotic drops for possible infection.  She will try to get into her eye doctor later today but we will also provide a referral to the ophthalmologist on-call if she is unable to get into her own eye professional.  The differential diagnosis of chemosis includes infection, allergic reaction and angioedema.  She is on an ARB.  PROCEDURES  Procedures  CONTACT LENS REMOVAL After informed verbal consent was obtained the patient's right eye was anesthetized with 2 drops of tetracaine.  A commercial suction cup type contact lens remover was then used to remove the soft cosmetic contact lens from the patient's right eye.  She tolerated this well and there were no immediate complications.  ED DIAGNOSES     ICD-10-CM   1. Chemosis of right conjunctiva  H11.421        Deryl Ports, , MD 02/17/20 [phone]

## 2020-02-17 NOTE — ED Triage Notes (Signed)
Patient presents with complaints of right eye swelling onset yesterday pm; denies drainage or redness; denies any known injury.

## 2020-04-21 ENCOUNTER — Encounter: Payer: Self-pay | Admitting: Family Medicine

## 2020-04-28 MED ORDER — AMLODIPINE BESYLATE 10 MG PO TABS: 10.0000 mg | ORAL_TABLET | Freq: Every day | ORAL | 0 refills | Status: DC

## 2020-04-28 NOTE — Addendum Note (Signed)
Addended by: Solon Augusta on: 04/28/2020 08:21 AM   Modules accepted: Orders

## 2020-05-09 ENCOUNTER — Emergency Department (HOSPITAL_BASED_OUTPATIENT_CLINIC_OR_DEPARTMENT_OTHER): Payer: 59

## 2020-05-09 ENCOUNTER — Encounter (HOSPITAL_BASED_OUTPATIENT_CLINIC_OR_DEPARTMENT_OTHER): Payer: Self-pay | Admitting: Emergency Medicine

## 2020-05-09 ENCOUNTER — Emergency Department (HOSPITAL_BASED_OUTPATIENT_CLINIC_OR_DEPARTMENT_OTHER)
Admission: EM | Admit: 2020-05-09 | Discharge: 2020-05-09 | Disposition: A | Discharge status: Home / Self Care | Payer: 59 | Attending: Emergency Medicine | Admitting: Emergency Medicine

## 2020-05-09 ENCOUNTER — Other Ambulatory Visit: Payer: Self-pay

## 2020-05-09 DIAGNOSIS — S9032XA Contusion of left foot, initial encounter: Secondary | ICD-10-CM | POA: Diagnosis not present

## 2020-05-09 DIAGNOSIS — Y929 Unspecified place or not applicable: Secondary | ICD-10-CM | POA: Diagnosis not present

## 2020-05-09 DIAGNOSIS — Y999 Unspecified external cause status: Secondary | ICD-10-CM | POA: Insufficient documentation

## 2020-05-09 DIAGNOSIS — Z79899 Other long term (current) drug therapy: Secondary | ICD-10-CM | POA: Insufficient documentation

## 2020-05-09 DIAGNOSIS — N182 Chronic kidney disease, stage 2 (mild): Secondary | ICD-10-CM | POA: Diagnosis not present

## 2020-05-09 DIAGNOSIS — Y9389 Activity, other specified: Secondary | ICD-10-CM | POA: Insufficient documentation

## 2020-05-09 DIAGNOSIS — W208XXA Other cause of strike by thrown, projected or falling object, initial encounter: Secondary | ICD-10-CM | POA: Insufficient documentation

## 2020-05-09 DIAGNOSIS — I129 Hypertensive chronic kidney disease with stage 1 through stage 4 chronic kidney disease, or unspecified chronic kidney disease: Secondary | ICD-10-CM | POA: Insufficient documentation

## 2020-05-09 DIAGNOSIS — S99922A Unspecified injury of left foot, initial encounter: Secondary | ICD-10-CM | POA: Diagnosis present

## 2020-05-09 MED ORDER — IBUPROFEN 400 MG PO TABS
400.0000 mg | ORAL_TABLET | Freq: Once | ORAL | Status: AC | PRN
  Administered 2020-05-09: 400 mg via ORAL
  Filled 2020-05-09: qty 1

## 2020-05-09 NOTE — Discharge Instructions (Addendum)
Please read instructions below. Apply ice to your foot for 20 minutes at a time. Elevate it as much as possible to help with swelling. You can take over-the-counter medications as directed for pain. Schedule an appointment with your primary care provider to follow-up on your injury. Return to the ER for new or concerning symptoms.

## 2020-05-09 NOTE — ED Triage Notes (Addendum)
Reports she dropped a conference table on her left foot.  Notably swollen.

## 2020-05-09 NOTE — ED Provider Notes (Signed)
[facility] EMERGENCY DEPARTMENT Provider Note   CSN: [id number] Arrival date & time: 05/09/20  1258     History Chief Complaint  Patient presents with  . Foot Pain    Cynthia Roach is a 49 y.o. female w PMHx HTN, presenting to the ED with sudden onset of left foot pain after a conference table top fell on her foot today. Pain is worse with movement and palpation. Denies numbness or other injuries. She was given ibuprofen in triage that is starting to help with the pain. No previous injuries to this foot.  The history is provided by the patient.       Past Medical History:  Diagnosis Date  . Chronic kidney disease    left ureteral stone  . Hypertension   . Ovarian cyst   . Seasonal allergies   . Trigeminal neuralgia     Patient Active Problem List   Diagnosis Date Noted  . Cellulitis of face 08/28/2018  . CKD (chronic kidney disease), stage II 09/18/2016  . Gestational diabetes 05/18/2014  . Trigeminal neuralgia 05/11/2013  . GOITER 02/10/2009  . Essential hypertension 04/29/2007  . HEADACHE 04/29/2007    Past Surgical History:  Procedure Laterality Date  . CESAREAN SECTION       OB History   No obstetric history on file.     Family History  Problem Relation Age of Onset  . Alcohol abuse Other   . Arthritis Other   . Breast cancer Other   . Colon cancer Other   . Diabetes Other   . Hyperlipidemia Other   . Hypertension Other   . Prostate cancer Other   . Depression Other   . Stroke Other   . Sudden death Other   . Coronary artery disease Other     Social History   Tobacco Use  . Smoking status: Never Smoker  . Smokeless tobacco: Never Used  Substance Use Topics  . Alcohol use: No    Alcohol/week: 0.0 standard drinks  . Drug use: No    Home Medications Prior to Admission medications   Medication Sig Start Date End Date Taking? Authorizing Provider  amLODipine (NORVASC) 10 MG tablet Take 1 tablet (10 mg total) by mouth  daily. 04/28/20   Nelwyn Salisbury, MD  EPINEPHrine (EPIPEN 2-PAK) 0.3 mg/0.3 mL IJ SOAJ injection Inject 0.3 mLs (0.3 mg total) into the muscle once. 05/18/14   Nelwyn Salisbury, MD  gabapentin (NEURONTIN) 300 MG capsule Take 1 capsule (300 mg total) by mouth 3 (three) times daily. 07/02/18   Nelwyn Salisbury, MD  losartan-hydrochlorothiazide (HYZAAR) 100-25 MG tablet Take 1 tablet by mouth daily. 09/02/18   Nelwyn Salisbury, MD    Allergies    Oxycodone, Shellfish allergy, Amoxicillin, Lisinopril, and Morphine and related  Review of Systems   Review of Systems  Musculoskeletal: Positive for arthralgias.  Skin: Negative for wound.  Neurological: Negative for numbness.    Physical Exam Updated Vital Signs BP (!) 144/104 (BP Location: Left Arm)   Pulse (!) 107   Temp 97.8 F (36.6 C) (Oral)   Resp 18   Ht 5\' 5"  (1.651 m)   Wt 72.6 kg   LMP 04/29/2020   SpO2 100%   BMI 26.63 kg/m   Physical Exam Vitals and nursing note reviewed.  Constitutional:      General: She is not in acute distress.    Appearance: She is well-developed.  HENT:     Head: Normocephalic  and atraumatic.  Eyes:     Conjunctiva/sclera: Conjunctivae normal.  Cardiovascular:     Rate and Rhythm: Normal rate.  Pulmonary:     Effort: Pulmonary effort is normal.  Musculoskeletal:     Comments: Dorsum of left foot with swelling and generalized tenderness.  No deformity or bruising.  Pain with range of motion of the foot.  Ankle is benign.  Normal sensation to the toes.  Toes are warm and well-perfused.  Neurological:     Mental Status: She is alert.  Psychiatric:        Mood and Affect: Mood normal.        Behavior: Behavior normal.     ED Results / Procedures / Treatments   Labs (all labs ordered are listed, but only abnormal results are displayed) Labs Reviewed - No data to display  EKG None  Radiology DG Foot Complete Left  Result Date: 05/09/2020 CLINICAL DATA:  Foot injury EXAM: LEFT FOOT - COMPLETE 3+  VIEW COMPARISON:  None. FINDINGS: Dorsal soft tissue swelling in the midfoot. No fracture identified. No significant arthropathy. Mild calcaneal spurring. IMPRESSION: Negative for fracture Electronically Signed   By: Marlan Palau M.D.   On: 05/09/2020 14:14    Procedures Procedures (including critical care time)  Medications Ordered in ED Medications  ibuprofen (ADVIL) tablet 400 mg (400 mg Oral Given 05/09/20 1322)    ED Course  I have reviewed the triage vital signs and the nursing notes.  Pertinent labs & imaging results that were available during my care of the patient were reviewed by me and considered in my medical decision making (see chart for details).    MDM Rules/Calculators/A&P                          Patient with contusion to left foot after dropping a conference table on the top of it today.  X-rays negative for acute fracture.  No wounds.  Pain is improving with ibuprofen.  Recommend symptomatic management, will apply Ace wrap for compression support, crutches to weight-bear as tolerated.  She is instructed to continue over-the-counter medications as needed for pain.  PCP follow-up recommended.  Patient is agreeable to plan, appropriate for discharge. Final Clinical Impression(s) / ED Diagnoses Final diagnoses:  Contusion of left foot, initial encounter    Rx / DC Orders ED Discharge Orders    None       Aela Bohan, [place] N, PA-C 05/09/20 1558    Arby Barrette, MD 05/10/20 1728

## 2020-05-15 ENCOUNTER — Ambulatory Visit (INDEPENDENT_AMBULATORY_CARE_PROVIDER_SITE_OTHER): Payer: 59 | Admitting: Family Medicine

## 2020-05-15 ENCOUNTER — Ambulatory Visit: Payer: Self-pay

## 2020-05-15 ENCOUNTER — Encounter: Payer: Self-pay | Admitting: Family Medicine

## 2020-05-15 ENCOUNTER — Other Ambulatory Visit: Payer: Self-pay

## 2020-05-15 VITALS — BP 143/103 | HR 98 | Ht 65.0 in | Wt 160.0 lb

## 2020-05-15 DIAGNOSIS — S93325A Dislocation of tarsometatarsal joint of left foot, initial encounter: Secondary | ICD-10-CM

## 2020-05-15 DIAGNOSIS — M79672 Pain in left foot: Secondary | ICD-10-CM

## 2020-05-15 MED ORDER — AMBULATORY NON FORMULARY MEDICATION: 0 refills | Status: DC

## 2020-05-15 NOTE — Progress Notes (Signed)
Cynthia Roach - 49 y.o. female MRN [id number]  Date of birth: July 13, 1971  SUBJECTIVE:  Including CC & ROS.  Chief Complaint  Patient presents with  . Foot Injury    left x 05/09/2020    Cynthia Roach is a 49 y.o. female that is presenting with left foot pain.  She was moving a table and it fell squarely on the foot.  Since that time she has had significant pain and swelling and bruising.  Any weightbearing causes significant pain across the midfoot.  No history of surgery.  Pain is ongoing and constant..  Independent review of left foot x-ray from 9/14 shows mild soft tissue swelling in the midfoot.   Review of Systems See HPI   HISTORY: Past Medical, Surgical, Social, and Family History Reviewed & Updated per EMR.   Pertinent Historical Findings include:  Past Medical History:  Diagnosis Date  . Chronic kidney disease    left ureteral stone  . Hypertension   . Ovarian cyst   . Seasonal allergies   . Trigeminal neuralgia     Past Surgical History:  Procedure Laterality Date  . CESAREAN SECTION      Family History  Problem Relation Age of Onset  . Alcohol abuse Other   . Arthritis Other   . Breast cancer Other   . Colon cancer Other   . Diabetes Other   . Hyperlipidemia Other   . Hypertension Other   . Prostate cancer Other   . Depression Other   . Stroke Other   . Sudden death Other   . Coronary artery disease Other     Social History   Socioeconomic History  . Marital status: Married    Spouse name: Not on file  . Number of children: Not on file  . Years of education: Not on file  . Highest education level: Not on file  Occupational History  . Not on file  Tobacco Use  . Smoking status: Never Smoker  . Smokeless tobacco: Never Used  Substance and Sexual Activity  . Alcohol use: No    Alcohol/week: 0.0 standard drinks  . Drug use: No  . Sexual activity: Not on file  Other Topics Concern  . Not on file  Social History Narrative  . Not  on file   Social Determinants of Health   Financial Resource Strain:   . Difficulty of Paying Living Expenses: Not on file  Food Insecurity:   . Worried About Programme researcher, broadcasting/film/video in the Last Year: Not on file  . Ran Out of Food in the Last Year: Not on file  Transportation Needs:   . Lack of Transportation (Medical): Not on file  . Lack of Transportation (Non-Medical): Not on file  Physical Activity:   . Days of Exercise per Week: Not on file  . Minutes of Exercise per Session: Not on file  Stress:   . Feeling of Stress : Not on file  Social Connections:   . Frequency of Communication with Friends and Family: Not on file  . Frequency of Social Gatherings with Friends and Family: Not on file  . Attends Religious Services: Not on file  . Active Member of Clubs or Organizations: Not on file  . Attends Banker Meetings: Not on file  . Marital Status: Not on file  Intimate Partner Violence:   . Fear of Current or Ex-Partner: Not on file  . Emotionally Abused: Not on file  . Physically Abused:  Not on file  . Sexually Abused: Not on file     PHYSICAL EXAM:  VS: BP (!) 143/103   Pulse 98   Ht 5\' 5"  (1.651 m)   Wt 160 lb (72.6 kg)   LMP 04/29/2020   BMI 26.63 kg/m  Physical Exam Gen: NAD, alert, cooperative with exam, well-appearing MSK:  Left foot: Obvious swelling and ecchymosis of the dorsal midfoot. Tenderness to palpation over the Lisfranc joint. Tenderness palpation over the Lisfranc ligament. Unable to bear weight without pain. Neurovascularly intact  Limited ultrasound: Left foot:  No fracture appreciated of the metatarsals. Increased hyperemia at the Lisfranc ligament to suggest a sprain or disruption. Increased hyperemia all throughout the Lisfranc joint with concern for subluxation.  Summary: Findings are concerning for Lisfranc rupture and Lisfranc joint subluxation.  Ultrasound and interpretation by 06/29/2020, MD    ASSESSMENT &  PLAN:   Lisfranc dislocation, left, initial encounter Injury occurred on 9/14.  Having ongoing pain and swelling.  Unable to bear weight.  Concern for Lisfranc ligament rupture and Lisfranc joint subluxation.  Given the trauma and impact that she sustained. -Rolling knee scooter. -Cam walker. -Counseled on supportive care. -MRI to evaluate for Lisfranc ligament rupture and Lisfranc joint disruption.

## 2020-05-15 NOTE — Patient Instructions (Signed)
Nice to meet you Please try the scooter  Please try ice   Please send me a message in MyChart with any questions or updates.  We will schedule a virtual visit once the MRI is resulted.   --Dr. Jordan Likes

## 2020-05-16 DIAGNOSIS — S93602D Unspecified sprain of left foot, subsequent encounter: Secondary | ICD-10-CM | POA: Insufficient documentation

## 2020-05-16 NOTE — Assessment & Plan Note (Signed)
Injury occurred on 9/14.  Having ongoing pain and swelling.  Unable to bear weight.  Concern for Lisfranc ligament rupture and Lisfranc joint subluxation.  Given the trauma and impact that she sustained. -Rolling knee scooter. -Cam walker. -Counseled on supportive care. -MRI to evaluate for Lisfranc ligament rupture and Lisfranc joint disruption.

## 2020-05-31 ENCOUNTER — Ambulatory Visit: Payer: 59 | Admitting: Family Medicine

## 2020-06-02 ENCOUNTER — Ambulatory Visit: Payer: 59 | Admitting: Family Medicine

## 2020-06-02 ENCOUNTER — Encounter: Payer: Self-pay | Admitting: Family Medicine

## 2020-06-02 ENCOUNTER — Other Ambulatory Visit: Payer: Self-pay

## 2020-06-02 ENCOUNTER — Ambulatory Visit
Admission: RE | Admit: 2020-06-02 | Discharge: 2020-06-02 | Disposition: A | Discharge status: Home / Self Care | Payer: 59 | Source: Ambulatory Visit | Attending: Family Medicine | Admitting: Family Medicine

## 2020-06-02 VITALS — BP 128/90 | HR 72 | Temp 99.0°F | Ht 67.0 in | Wt 157.0 lb

## 2020-06-02 DIAGNOSIS — L03211 Cellulitis of face: Secondary | ICD-10-CM | POA: Diagnosis not present

## 2020-06-02 DIAGNOSIS — S93325A Dislocation of tarsometatarsal joint of left foot, initial encounter: Secondary | ICD-10-CM

## 2020-06-02 MED ORDER — CEPHALEXIN 500 MG PO CAPS: 500.0000 mg | ORAL_CAPSULE | Freq: Three times a day (TID) | ORAL | 0 refills | Status: AC

## 2020-06-02 NOTE — Progress Notes (Signed)
   Subjective:    Patient ID: Cynthia Roach, female    DOB: [birth year], 49 y.o.   MRN: [id number]  HPI Here for a recurrence of facial cellulitis. She struggled with this for years until early last year, when it seemed to calm down. Now for the past few weeks her skin is swollen and painful again. No fever. She does not put anything on her face.   Review of Systems  Constitutional: Negative.   HENT: Positive for facial swelling.   Eyes: Negative.   Respiratory: Negative.   Cardiovascular: Negative.        Objective:   Physical Exam Constitutional:      Appearance: Normal appearance.  HENT:     Head:     Comments: She is puffy and tender over the left cheek area     Right Ear: Tympanic membrane, ear canal and external ear normal.     Left Ear: Tympanic membrane, ear canal and external ear normal.     Nose: Nose normal.     Mouth/Throat:     Pharynx: Oropharynx is clear.  Eyes:     Conjunctiva/sclera: Conjunctivae normal.  Neck:     Comments: Several small enlarged AC nodes  Musculoskeletal:     Cervical back: No rigidity.  Neurological:     Mental Status: She is alert.           Assessment & Plan:  Facial cellulitis, treat with 14 days of Keflex 500 mg TID.  Gershon Crane, MD

## 2020-06-05 ENCOUNTER — Telehealth: Payer: Self-pay | Admitting: Family Medicine

## 2020-06-05 NOTE — Telephone Encounter (Signed)
Called pt to schedule MRI review appt--no answer/ unable to leave msg (VMB full)--will cb later.  --glh

## 2020-06-15 ENCOUNTER — Telehealth (INDEPENDENT_AMBULATORY_CARE_PROVIDER_SITE_OTHER): Payer: 59 | Admitting: Family Medicine

## 2020-06-15 ENCOUNTER — Other Ambulatory Visit: Payer: Self-pay

## 2020-06-15 DIAGNOSIS — S93602D Unspecified sprain of left foot, subsequent encounter: Secondary | ICD-10-CM

## 2020-06-15 NOTE — Progress Notes (Signed)
Virtual Visit via Video Note  I connected with Cynthia Roach on 06/15/20 at  1:20 PM EDT by a video enabled telemedicine application and verified that I am speaking with the correct person using two identifiers.  Location: Patient: car Provider: office   I discussed the limitations of evaluation and management by telemedicine and the availability of in person appointments. The patient expressed understanding and agreed to proceed.  History of Present Illness:  Cynthia Roach is a 49 year old female that is following up after the MRI of her left foot.  This was revealing for a contusion on the dorsum of the foot.   Observations/Objective:  Gen: NAD, alert, cooperative with exam, well-appearing  Assessment and Plan:  Foot sprain: MRI was reassuring.  This is only demonstrating a contusion on the dorsum of the foot.  There is no structural abnormalities appreciated. -Counseled on home exercise therapy and supportive care. -Counseled alternating heat and ice. -Could consider custom orthotics.  Follow Up Instructions:    I discussed the assessment and treatment plan with the patient. The patient was provided an opportunity to ask questions and all were answered. The patient agreed with the plan and demonstrated an understanding of the instructions.   The patient was advised to call back or seek an in-person evaluation if the symptoms worsen or if the condition fails to improve as anticipated.    Clare Gandy, MD

## 2020-06-15 NOTE — Assessment & Plan Note (Signed)
MRI was reassuring.  This is only demonstrating a contusion on the dorsum of the foot.  There is no structural abnormalities appreciated. -Counseled on home exercise therapy and supportive care. -Counseled alternating heat and ice. -Could consider custom orthotics.

## 2020-07-04 ENCOUNTER — Ambulatory Visit: Payer: 59 | Admitting: Family Medicine

## 2020-07-04 ENCOUNTER — Other Ambulatory Visit: Payer: Self-pay

## 2020-07-04 ENCOUNTER — Encounter: Payer: Self-pay | Admitting: Family Medicine

## 2020-07-04 ENCOUNTER — Telehealth: Payer: Self-pay | Admitting: Family Medicine

## 2020-07-04 VITALS — BP 120/86 | HR 70 | Temp 98.1°F | Ht 67.01 in | Wt 168.6 lb

## 2020-07-04 DIAGNOSIS — L03211 Cellulitis of face: Secondary | ICD-10-CM | POA: Diagnosis not present

## 2020-07-04 MED ORDER — CEPHALEXIN 500 MG PO CAPS: 500.0000 mg | ORAL_CAPSULE | Freq: Three times a day (TID) | ORAL | 0 refills | Status: AC

## 2020-07-04 MED ORDER — CEFTRIAXONE SODIUM 1 G IJ SOLR
1.0000 g | Freq: Once | INTRAMUSCULAR | Status: AC
  Administered 2020-07-04: 1 g via INTRAMUSCULAR

## 2020-07-04 MED ORDER — GABAPENTIN 300 MG PO CAPS
600.0000 mg | ORAL_CAPSULE | Freq: Three times a day (TID) | ORAL | 3 refills | Status: DC
Start: 2020-07-04 — End: 2020-09-01

## 2020-07-04 NOTE — Addendum Note (Signed)
Addended by: Hulan Fray on: 07/04/2020 03:19 PM   Modules accepted: Orders

## 2020-07-04 NOTE — Telephone Encounter (Signed)
I spoke with Cynthia Roach. She is aware Rx is for 30 days.

## 2020-07-04 NOTE — Progress Notes (Signed)
   Subjective:    Patient ID: Cynthia Roach, female    DOB: Jan 12, 1971, 49 y.o.   MRN: [id number]  HPI Here for another flare of cellulitis on the face. This involves the left side. It started 5 days ago and it is quite painful. No fever.    Review of Systems  Constitutional: Negative.   HENT: Positive for facial swelling.   Eyes: Negative.   Respiratory: Negative.   Cardiovascular: Negative.        Objective:   Physical Exam Constitutional:      Appearance: Normal appearance.  HENT:     Right Ear: Tympanic membrane, ear canal and external ear normal.     Left Ear: Tympanic membrane, ear canal and external ear normal.     Nose: Nose normal.     Mouth/Throat:     Pharynx: Oropharynx is clear.     Comments: She is puffy and tender in the left cheek area  Eyes:     Conjunctiva/sclera: Conjunctivae normal.  Lymphadenopathy:     Cervical: No cervical adenopathy.  Neurological:     Mental Status: She is alert.           Assessment & Plan:  Facial cellulitis, she is given a shot of Rocephin today. s he will follow this with 30 days of Keflex 500 mg TID. We will increase the Gabapentin to 600 mg TID as needed. Gershon Crane, MD

## 2020-07-04 NOTE — Telephone Encounter (Signed)
Warden Fillers is calling and wanted to speak to someone regarding cephALEXin (KEFLEX) 500 MG capsule that was sent to the pharamcy today, please advise. CB is [phone]

## 2020-07-11 ENCOUNTER — Encounter: Payer: Self-pay | Admitting: Family Medicine

## 2020-07-12 MED ORDER — LOSARTAN POTASSIUM-HCTZ 100-25 MG PO TABS: 1.0000 | ORAL_TABLET | Freq: Every day | ORAL | 3 refills | Status: DC

## 2020-07-12 MED ORDER — AMLODIPINE BESYLATE 10 MG PO TABS
10.0000 mg | ORAL_TABLET | Freq: Every day | ORAL | 3 refills | Status: DC
Start: 2020-07-12 — End: 2021-08-31

## 2020-07-12 NOTE — Telephone Encounter (Signed)
I refilled both BP medications for a year. I cannot refill the glaucoma drops however, that needs to be done by her eye doctor

## 2020-08-07 ENCOUNTER — Inpatient Hospital Stay (HOSPITAL_BASED_OUTPATIENT_CLINIC_OR_DEPARTMENT_OTHER)
Admission: EM | Admit: 2020-08-07 | Discharge: 2020-08-11 | DRG: 177 | Disposition: A | Discharge status: Home / Self Care | Payer: 59 | Attending: Internal Medicine | Admitting: Internal Medicine

## 2020-08-07 ENCOUNTER — Other Ambulatory Visit: Payer: Self-pay

## 2020-08-07 ENCOUNTER — Emergency Department (HOSPITAL_BASED_OUTPATIENT_CLINIC_OR_DEPARTMENT_OTHER): Payer: 59

## 2020-08-07 ENCOUNTER — Encounter (HOSPITAL_BASED_OUTPATIENT_CLINIC_OR_DEPARTMENT_OTHER): Payer: Self-pay | Admitting: Emergency Medicine

## 2020-08-07 DIAGNOSIS — E876 Hypokalemia: Secondary | ICD-10-CM | POA: Diagnosis present

## 2020-08-07 DIAGNOSIS — G500 Trigeminal neuralgia: Secondary | ICD-10-CM | POA: Diagnosis present

## 2020-08-07 DIAGNOSIS — J9601 Acute respiratory failure with hypoxia: Secondary | ICD-10-CM | POA: Diagnosis present

## 2020-08-07 DIAGNOSIS — J1282 Pneumonia due to coronavirus disease 2019: Secondary | ICD-10-CM | POA: Diagnosis present

## 2020-08-07 DIAGNOSIS — D72829 Elevated white blood cell count, unspecified: Secondary | ICD-10-CM | POA: Diagnosis not present

## 2020-08-07 DIAGNOSIS — U071 COVID-19: Secondary | ICD-10-CM | POA: Diagnosis present

## 2020-08-07 DIAGNOSIS — Z881 Allergy status to other antibiotic agents status: Secondary | ICD-10-CM

## 2020-08-07 DIAGNOSIS — D631 Anemia in chronic kidney disease: Secondary | ICD-10-CM | POA: Diagnosis present

## 2020-08-07 DIAGNOSIS — J302 Other seasonal allergic rhinitis: Secondary | ICD-10-CM | POA: Diagnosis present

## 2020-08-07 DIAGNOSIS — G4700 Insomnia, unspecified: Secondary | ICD-10-CM | POA: Diagnosis present

## 2020-08-07 DIAGNOSIS — I129 Hypertensive chronic kidney disease with stage 1 through stage 4 chronic kidney disease, or unspecified chronic kidney disease: Secondary | ICD-10-CM | POA: Diagnosis present

## 2020-08-07 DIAGNOSIS — Z79899 Other long term (current) drug therapy: Secondary | ICD-10-CM | POA: Diagnosis not present

## 2020-08-07 DIAGNOSIS — Z885 Allergy status to narcotic agent status: Secondary | ICD-10-CM

## 2020-08-07 DIAGNOSIS — I10 Essential (primary) hypertension: Secondary | ICD-10-CM | POA: Diagnosis not present

## 2020-08-07 DIAGNOSIS — N182 Chronic kidney disease, stage 2 (mild): Secondary | ICD-10-CM | POA: Diagnosis present

## 2020-08-07 DIAGNOSIS — Z888 Allergy status to other drugs, medicaments and biological substances status: Secondary | ICD-10-CM

## 2020-08-07 DIAGNOSIS — T380X5A Adverse effect of glucocorticoids and synthetic analogues, initial encounter: Secondary | ICD-10-CM | POA: Diagnosis not present

## 2020-08-07 DIAGNOSIS — Z91013 Allergy to seafood: Secondary | ICD-10-CM | POA: Diagnosis not present

## 2020-08-07 HISTORY — DX: COVID-19: U07.1

## 2020-08-07 LAB — COMPREHENSIVE METABOLIC PANEL
ALT: 30 U/L (ref 0–44)
AST: 53 U/L — ABNORMAL HIGH (ref 15–41)
Albumin: 3.1 g/dL — ABNORMAL LOW (ref 3.5–5.0)
Alkaline Phosphatase: 71 U/L (ref 38–126)
Anion gap: 11 (ref 5–15)
BUN: 13 mg/dL (ref 6–20)
CO2: 28 mmol/L (ref 22–32)
Calcium: 8.5 mg/dL — ABNORMAL LOW (ref 8.9–10.3)
Chloride: 99 mmol/L (ref 98–111)
Creatinine, Ser: 1.04 mg/dL — ABNORMAL HIGH (ref 0.44–1.00)
GFR, Estimated: >60 mL/min (ref >60)
Glucose, Bld: 104 mg/dL — ABNORMAL HIGH (ref 70–99)
Potassium: 3.4 mmol/L — ABNORMAL LOW (ref 3.5–5.1)
Sodium: 138 mmol/L (ref 135–145)
Total Bilirubin: 0.6 mg/dL (ref 0.3–1.2)
Total Protein: 7.7 g/dL (ref 6.5–8.1)

## 2020-08-07 LAB — CBC WITH DIFFERENTIAL/PLATELET
Abs Immature Granulocytes: 0.14 10*3/uL — ABNORMAL HIGH (ref 0.00–0.07)
Basophils Absolute: 0 10*3/uL (ref 0.0–0.1)
Basophils Relative: 0 %
Eosinophils Absolute: 0 10*3/uL (ref 0.0–0.5)
Eosinophils Relative: 0 %
HCT: 35.5 % — ABNORMAL LOW (ref 36.0–46.0)
Hemoglobin: 11.7 g/dL — ABNORMAL LOW (ref 12.0–15.0)
Immature Granulocytes: 1 %
Lymphocytes Relative: 5 %
Lymphs Abs: 0.5 10*3/uL — ABNORMAL LOW (ref 0.7–4.0)
MCH: 30.1 pg (ref 26.0–34.0)
MCHC: 33 g/dL (ref 30.0–36.0)
MCV: 91.3 fL (ref 80.0–100.0)
Monocytes Absolute: 0.4 10*3/uL (ref 0.1–1.0)
Monocytes Relative: 4 %
Neutro Abs: 9.2 10*3/uL — ABNORMAL HIGH (ref 1.7–7.7)
Neutrophils Relative %: 90 %
Platelets: 413 10*3/uL — ABNORMAL HIGH (ref 150–400)
RBC: 3.89 MIL/uL (ref 3.87–5.11)
RDW: 13.4 % (ref 11.5–15.5)
WBC: 10.3 10*3/uL (ref 4.0–10.5)
nRBC: 0 % (ref 0.0–0.2)

## 2020-08-07 LAB — LACTIC ACID, PLASMA: Lactic Acid, Venous: 1 mmol/L (ref 0.5–1.9)

## 2020-08-07 LAB — LACTATE DEHYDROGENASE: LDH: 438 U/L — ABNORMAL HIGH (ref 98–192)

## 2020-08-07 LAB — FERRITIN: Ferritin: 297 ng/mL (ref 11–307)

## 2020-08-07 LAB — RESP PANEL BY RT-PCR (FLU A&B, COVID) ARPGX2
Influenza A by PCR: NEGATIVE
Influenza B by PCR: NEGATIVE
SARS Coronavirus 2 by RT PCR: POSITIVE — AB

## 2020-08-07 LAB — C-REACTIVE PROTEIN: CRP: 16.7 mg/dL — ABNORMAL HIGH (ref <1.0)

## 2020-08-07 LAB — TRIGLYCERIDES: Triglycerides: 133 mg/dL (ref <150)

## 2020-08-07 LAB — FIBRINOGEN: Fibrinogen: 656 mg/dL — ABNORMAL HIGH (ref 210–475)

## 2020-08-07 LAB — D-DIMER, QUANTITATIVE: D-Dimer, Quant: 1.82 ug/mL-FEU — ABNORMAL HIGH (ref 0.00–0.50)

## 2020-08-07 LAB — PROCALCITONIN: Procalcitonin: 0.28 ng/mL

## 2020-08-07 MED ORDER — KETOROLAC TROMETHAMINE 30 MG/ML IJ SOLN
30.0000 mg | Freq: Once | INTRAMUSCULAR | Status: AC
  Administered 2020-08-07: 15:00:00 30 mg via INTRAVENOUS
  Filled 2020-08-07: qty 1

## 2020-08-07 MED ORDER — SODIUM CHLORIDE 0.9 % IV SOLN
100.0000 mg | Freq: Every day | INTRAVENOUS | Status: AC
  Administered 2020-08-08 – 2020-08-11 (×4): 100 mg via INTRAVENOUS
  Filled 2020-08-07 (×4): qty 20

## 2020-08-07 MED ORDER — HYDROCHLOROTHIAZIDE 25 MG PO TABS: 25.0000 mg | ORAL_TABLET | Freq: Every day | ORAL | Status: DC

## 2020-08-07 MED ORDER — LATANOPROST 0.005 % OP SOLN
1.0000 [drp] | Freq: Every day | OPHTHALMIC | Status: DC
  Administered 2020-08-07 – 2020-08-10 (×4): 1 [drp] via OPHTHALMIC
  Filled 2020-08-07: qty 2.5

## 2020-08-07 MED ORDER — LOSARTAN POTASSIUM 50 MG PO TABS: 100.0000 mg | ORAL_TABLET | Freq: Every day | ORAL | Status: DC

## 2020-08-07 MED ORDER — SODIUM CHLORIDE 0.9 % IV SOLN
100.0000 mg | INTRAVENOUS | Status: AC
  Administered 2020-08-07 (×2): 100 mg via INTRAVENOUS
  Filled 2020-08-07 (×2): qty 20

## 2020-08-07 MED ORDER — AMLODIPINE BESYLATE 10 MG PO TABS
10.0000 mg | ORAL_TABLET | Freq: Every day | ORAL | Status: DC
  Administered 2020-08-08 – 2020-08-11 (×4): 10 mg via ORAL
  Filled 2020-08-07 (×4): qty 1

## 2020-08-07 MED ORDER — ACETAMINOPHEN 650 MG RE SUPP: 650.0000 mg | Freq: Four times a day (QID) | RECTAL | Status: DC | PRN

## 2020-08-07 MED ORDER — LOSARTAN POTASSIUM-HCTZ 100-25 MG PO TABS: 1.0000 | ORAL_TABLET | Freq: Every day | ORAL | Status: DC

## 2020-08-07 MED ORDER — ACETAMINOPHEN 500 MG PO TABS: 1000.0000 mg | ORAL_TABLET | Freq: Once | ORAL | Status: DC

## 2020-08-07 MED ORDER — GABAPENTIN 300 MG PO CAPS
600.0000 mg | ORAL_CAPSULE | Freq: Three times a day (TID) | ORAL | Status: DC
  Administered 2020-08-07 – 2020-08-11 (×11): 600 mg via ORAL
  Filled 2020-08-07 (×11): qty 2

## 2020-08-07 MED ORDER — SODIUM CHLORIDE 0.9 % IV BOLUS
1000.0000 mL | Freq: Once | INTRAVENOUS | Status: AC
  Administered 2020-08-07: 15:00:00 1000 mL via INTRAVENOUS

## 2020-08-07 MED ORDER — ACETAMINOPHEN 325 MG PO TABS
650.0000 mg | ORAL_TABLET | Freq: Four times a day (QID) | ORAL | Status: DC | PRN
  Administered 2020-08-07 – 2020-08-10 (×6): 650 mg via ORAL
  Filled 2020-08-07 (×6): qty 2

## 2020-08-07 MED ORDER — ENOXAPARIN SODIUM 40 MG/0.4ML ~~LOC~~ SOLN
40.0000 mg | SUBCUTANEOUS | Status: DC
  Administered 2020-08-07 – 2020-08-10 (×4): 40 mg via SUBCUTANEOUS
  Filled 2020-08-07 (×4): qty 0.4

## 2020-08-07 MED ORDER — HYDROCOD POLST-CPM POLST ER 10-8 MG/5ML PO SUER
5.0000 mL | Freq: Once | ORAL | Status: AC
  Administered 2020-08-07: 5 mL via ORAL
  Filled 2020-08-07: qty 5

## 2020-08-07 MED ORDER — METHYLPREDNISOLONE SODIUM SUCC 125 MG IJ SOLR
1.0000 mg/kg | Freq: Two times a day (BID) | INTRAMUSCULAR | Status: DC
  Administered 2020-08-07 – 2020-08-11 (×8): 68.75 mg via INTRAVENOUS
  Filled 2020-08-07 (×8): qty 2

## 2020-08-07 MED ORDER — GUAIFENESIN-DM 100-10 MG/5ML PO SYRP
5.0000 mL | ORAL_SOLUTION | ORAL | Status: DC | PRN
  Administered 2020-08-07 – 2020-08-10 (×7): 5 mL via ORAL
  Filled 2020-08-07 (×7): qty 10

## 2020-08-07 NOTE — H&P (Signed)
History and Physical    Cynthia Roach RWE:[id number] DOB: 1970-09-08 DOA: 08/07/2020  PCP: Nelwyn Salisbury, MD  Patient coming from: Home.  Chief Complaint: Shortness of breath.  HPI: Cynthia Roach is a 49 y.o. female with history of hypertension, trigeminal neuralgia presents to the ER with complaint of shortness of breath subjective feeling of fever chills and cough.  Has been having poor appetite but no nausea vomiting or diarrhea.  Has not taken COVID-19 vaccines.  ED Course: In the ER patient was febrile with temperature of 103 F tachycardic hypoxic requiring 2 L oxygen with chest x-ray showing bilateral infiltrates Covid test was positive.  Labs are significant for CRP of 16.7 creatinine 1.04 potassium 3.4 hemoglobin 11.7 with EKG showing sinus tachycardia.  Patient was started on remdesivir and steroids.  Admitted for further management of Covid infection.  Review of Systems: As per HPI, rest all negative.   Past Medical History:  Diagnosis Date  . Chronic kidney disease    left ureteral stone  . Hypertension   . Ovarian cyst   . Seasonal allergies   . Trigeminal neuralgia     Past Surgical History:  Procedure Laterality Date  . CESAREAN SECTION       reports that she has never smoked. She has never used smokeless tobacco. She reports that she does not drink alcohol and does not use drugs.  Allergies  Allergen Reactions  . Oxycodone Hives and Anaphylaxis    Had hives and shortness of breath Hives Other reaction(s): Hives Had hives and shortness of breath  . Shellfish Allergy Anaphylaxis and Hives  . Amoxicillin Other (See Comments)    Burns when urinating Burns when urinating  . Lisinopril Cough  . Morphine And Related     SOB, rash & hives    Family History  Problem Relation Age of Onset  . Alcohol abuse Other   . Arthritis Other   . Breast cancer Other   . Colon cancer Other   . Diabetes Other   . Hyperlipidemia Other   . Hypertension  Other   . Prostate cancer Other   . Depression Other   . Stroke Other   . Sudden death Other   . Coronary artery disease Other     Prior to Admission medications   Medication Sig Start Date End Date Taking? Authorizing Provider  acetaminophen (TYLENOL) 500 MG tablet Take 1,000 mg by mouth every 6 (six) hours as needed for fever.   Yes [provider]  amLODipine (NORVASC) 10 MG tablet Take 1 tablet (10 mg total) by mouth daily. 07/12/20  Yes Nelwyn Salisbury, MD  cephALEXin (KEFLEX) 500 MG capsule Take 500 mg by mouth 3 (three) times daily.   Yes [provider]  EPINEPHrine (EPIPEN 2-PAK) 0.3 mg/0.3 mL IJ SOAJ injection Inject 0.3 mLs (0.3 mg total) into the muscle once. Patient taking differently: Inject 0.3 mg into the muscle daily as needed for anaphylaxis. 05/18/14  Yes Nelwyn Salisbury, MD  gabapentin (NEURONTIN) 300 MG capsule Take 2 capsules (600 mg total) by mouth 3 (three) times daily. 07/04/20  Yes Nelwyn Salisbury, MD  latanoprost (XALATAN) 0.005 % ophthalmic solution Place 1 drop into both eyes at bedtime. 09/28/19  Yes [provider]  losartan-hydrochlorothiazide (HYZAAR) 100-25 MG tablet Take 1 tablet by mouth daily. 07/12/20  Yes Nelwyn Salisbury, MD  tretinoin (RETIN-A) 0.05 % cream Apply 1 application topically at bedtime. 01/25/20  Yes [provider]  AMBULATORY NON FORMULARY MEDICATION Rolling Knee Scooter, please take Rx to medical supply store. 05/15/20   Myra Rude, MD    Physical Exam: Constitutional: Moderately built and nourished. Vitals:   08/07/20 1745 08/07/20 1800 08/07/20 1901 08/07/20 2204  BP: (!) 141/90 (!) 141/88 (!) 154/86 (!) 158/95  Pulse: 97 94 (!) 105 90  Resp: (!) 25 (!) 25 20 18   Temp:   100.2 F (37.9 C) 99.1 F (37.3 C)  TempSrc:   Oral Oral  SpO2: 92% 94% 100%   Weight:      Height:       Eyes: Anicteric no pallor. ENMT: No discharge from the ears eyes nose or mouth. Neck: No mass felt.  No neck  rigidity. Respiratory: No rhonchi or crepitations. Cardiovascular: S1-S2 heard. Abdomen: Soft nontender bowel sounds present. Musculoskeletal: No edema. Skin: No rash. Neurologic: Alert awake oriented to time place and person.  Moves all extremities. Psychiatric: Appears normal.  Normal affect.   Labs on Admission: I have personally reviewed following labs and imaging studies  CBC: Recent Labs  Lab 08/07/20 1445  WBC 10.3  NEUTROABS 9.2*  HGB 11.7*  HCT 35.5*  MCV 91.3  PLT 413*   Basic Metabolic Panel: Recent Labs  Lab 08/07/20 1445  NA 138  K 3.4*  CL 99  CO2 28  GLUCOSE 104*  BUN 13  CREATININE 1.04*  CALCIUM 8.5*   GFR: Estimated Creatinine Clearance: 64.3 mL/min (A) (by C-G formula based on SCr of 1.04 mg/dL (H)). Liver Function Tests: Recent Labs  Lab 08/07/20 1445  AST 53*  ALT 30  ALKPHOS 71  BILITOT 0.6  PROT 7.7  ALBUMIN 3.1*   No results for input(s): LIPASE, AMYLASE in the last 168 hours. No results for input(s): AMMONIA in the last 168 hours. Coagulation Profile: No results for input(s): INR, PROTIME in the last 168 hours. Cardiac Enzymes: No results for input(s): CKTOTAL, CKMB, CKMBINDEX, TROPONINI in the last 168 hours. BNP (last 3 results) No results for input(s): PROBNP in the last 8760 hours. HbA1C: No results for input(s): HGBA1C in the last 72 hours. CBG: No results for input(s): GLUCAP in the last 168 hours. Lipid Profile: Recent Labs    08/07/20 1609  TRIG 133   Thyroid Function Tests: No results for input(s): TSH, T4TOTAL, FREET4, T3FREE, THYROIDAB in the last 72 hours. Anemia Panel: Recent Labs    08/07/20 1609  FERRITIN 297   Urine analysis:    Component Value Date/Time   COLORURINE YELLOW 09/15/2016 2005   APPEARANCEUR CLEAR 09/15/2016 2005   LABSPEC 1.021 09/15/2016 2005   PHURINE 7.0 09/15/2016 2005   GLUCOSEU NEGATIVE 09/15/2016 2005   HGBUR MODERATE (A) 09/15/2016 2005   BILIRUBINUR NEGATIVE 09/15/2016  2005   BILIRUBINUR neg 08/12/2011 1445   KETONESUR NEGATIVE 09/15/2016 2005   PROTEINUR NEGATIVE 09/15/2016 2005   UROBILINOGEN 0.2 05/06/2015 1800   NITRITE NEGATIVE 09/15/2016 2005   LEUKOCYTESUR NEGATIVE 09/15/2016 2005   Sepsis Labs: @LABRCNTIP (procalcitonin:4,lacticidven:4) ) Recent Results (from the past 240 hour(s))  Resp Panel by RT-PCR (Flu A&B, Covid) Nasopharyngeal Swab     Status: Abnormal   Collection Time: 08/07/20  2:45 PM   Specimen: Nasopharyngeal Swab; Nasopharyngeal(NP) swabs in vial transport medium  Result Value Ref Range Status   SARS Coronavirus 2 by RT PCR POSITIVE (A) NEGATIVE Final    Comment: RESULT CALLED TO, READ BACK BY AND VERIFIED WITH: SIMMS MARVA, RN @1542  ON 08/07/2020, [email or web address] (NOTE) SARS-CoV-2 target nucleic  acids are DETECTED.  The SARS-CoV-2 RNA is generally detectable in upper respiratory specimens during the acute phase of infection. Positive results are indicative of the presence of the identified virus, but do not rule out bacterial infection or co-infection with other pathogens not detected by the test. Clinical correlation with patient history and other diagnostic information is necessary to determine patient infection status. The expected result is Negative.  Fact Sheet for Patients: BloggerCourse.comhttps://www.fda.gov/media/152166/download  Fact Sheet for Healthcare Providers: SeriousBroker.ithttps://www.fda.gov/media/152162/download  This test is not yet approved or cleared by the [place] FDA and  has been authorized for detection and/or diagnosis of SARS-CoV-2 by FDA under an Emergency Use Authorization (EUA).  This EUA will remain in effect (meaning t his test can be used) for the duration of  the COVID-19 declaration under Section 564(b)(1) of the Act, 21 U.S.C. section 360bbb-3(b)(1), unless the authorization is terminated or revoked sooner.     Influenza A by PCR NEGATIVE NEGATIVE Final   Influenza B by PCR NEGATIVE NEGATIVE Final     Comment: (NOTE) The Xpert Xpress SARS-CoV-2/FLU/RSV plus assay is intended as an aid in the diagnosis of influenza from Nasopharyngeal swab specimens and should not be used as a sole basis for treatment. Nasal washings and aspirates are unacceptable for Xpert Xpress SARS-CoV-2/FLU/RSV testing.  Fact Sheet for Patients: BloggerCourse.comhttps://www.fda.gov/media/152166/download  Fact Sheet for Healthcare Providers: SeriousBroker.ithttps://www.fda.gov/media/152162/download  This test is not yet approved or cleared by the [place] FDA and has been authorized for detection and/or diagnosis of SARS-CoV-2 by FDA under an Emergency Use Authorization (EUA). This EUA will remain in effect (meaning this test can be used) for the duration of the COVID-19 declaration under Section 564(b)(1) of the Act, 21 U.S.C. section 360bbb-3(b)(1), unless the authorization is terminated or revoked.  Performed at [facility] [place], [address] [place], [place] [id number]      Radiological Exams on Admission: DG Chest Portable 1 View  Result Date: 08/07/2020 CLINICAL DATA:  Cough and shortness of breath EXAM: PORTABLE CHEST 1 VIEW COMPARISON:  July 19, 2016 FINDINGS: There is airspace opacity in portions of the left mid and lower lung regions as well as in the right perihilar region. Heart size and pulmonary vascularity are normal. No adenopathy. No bone lesions. IMPRESSION: Multifocal pneumonia, more severe on the left than on the right. Advise check of COVID-19 status given this appearance, although bacterial pneumonia could present in this manner. Heart size normal. No adenopathy. Electronically Signed   By: Bretta BangWilliam  Woodruff III M.D.   On: 08/07/2020 14:27    EKG: Independently reviewed.  Sinus tachycardia.  Assessment/Plan Principal Problem:   Pneumonia due to COVID-19 virus Active Problems:   Essential hypertension   Trigeminal neuralgia   CKD (chronic kidney disease), stage II    1. Acute respiratory  failure with hypoxia secondary to Covid pneumonia presently on 2 L oxygen and has been started on remdesivir and Solu-Medrol.  Closely monitor respiratory status and probably markers.  Patient is consented for baricitinib and Actemra if required. 2. Hypertension on amlodipine.  Holding losartan and hydrochlorothiazide due to acute renal failure.  As needed IV hydralazine. 3. Acute renal failure hypokalemia holding ARB and hydrochlorothiazide for now.  If creatinine improves may restart. 4. History of trigeminal neuralgia on gabapentin. 5. Anemia follow CBC.  Given that patient has acute respiratory failure with Covid infection will need close monitoring for any further worsening.   DVT prophylaxis: Lovenox. Code Status: Full code. Family Communication: Discussed with  patient. Disposition Plan: Home. Consults called: None. Admission status: Inpatient.   Eduard Clos MD [facility] Pager [phone].  If 7PM-7AM, please contact night-coverage [email or web address] Password [facility]  08/07/2020, 10:09 PM

## 2020-08-07 NOTE — ED Notes (Signed)
Moved pt to 3L as 02 sat dropped to 88% while sitting. Pt is 95% on 3L.

## 2020-08-07 NOTE — ED Triage Notes (Signed)
Per EMS:  Pt having URI symptoms for one week.  Pt not getting better and called EMS.  Pt having severe weakness and run down.  Pox on room air was 90%.  Having sob on exertion.  Temp 105 per EMS temporal scanner.  Pt has active cough.

## 2020-08-07 NOTE — Plan of Care (Signed)

## 2020-08-07 NOTE — ED Provider Notes (Signed)
[facility] EMERGENCY DEPARTMENT Provider Note   CSN: [id number] Arrival date & time: 08/07/20  1312     History Chief Complaint  Patient presents with  . Shortness of Breath    Cynthia Roach is a 49 y.o. female.  49 yo F with a chief complaints of cough congestion fevers and fatigue.  This been going on for about a week now.  Both of her children have been sick with a similar illness though they are doing much better currently.  She had one episode of loose stools at the onset of the illness but none since.  Has been eating and drinking but somewhat less.  She has been having a lot of trouble sleeping due to the cough.  Thinks if she was able to sleep she would feel much better.  Denies abdominal pain denies chest pain.  The history is provided by the patient.  Shortness of Breath Severity:  Moderate Onset quality:  Gradual Duration:  1 week Timing:  Constant Progression:  Worsening Chronicity:  New Relieved by:  Nothing Worsened by:  Nothing Ineffective treatments:  None tried Associated symptoms: cough and fever   Associated symptoms: no chest pain, no headaches, no vomiting and no wheezing        Past Medical History:  Diagnosis Date  . Chronic kidney disease    left ureteral stone  . Hypertension   . Ovarian cyst   . Seasonal allergies   . Trigeminal neuralgia     Patient Active Problem List   Diagnosis Date Noted  . Foot sprain, left, subsequent encounter 05/16/2020  . Cellulitis of face 08/28/2018  . CKD (chronic kidney disease), stage II 09/18/2016  . Gestational diabetes 05/18/2014  . Trigeminal neuralgia 05/11/2013  . GOITER 02/10/2009  . Essential hypertension 04/29/2007  . HEADACHE 04/29/2007    Past Surgical History:  Procedure Laterality Date  . CESAREAN SECTION       OB History   No obstetric history on file.     Family History  Problem Relation Age of Onset  . Alcohol abuse Other   . Arthritis Other   . Breast  cancer Other   . Colon cancer Other   . Diabetes Other   . Hyperlipidemia Other   . Hypertension Other   . Prostate cancer Other   . Depression Other   . Stroke Other   . Sudden death Other   . Coronary artery disease Other     Social History   Tobacco Use  . Smoking status: Never Smoker  . Smokeless tobacco: Never Used  Substance Use Topics  . Alcohol use: No    Alcohol/week: 0.0 standard drinks  . Drug use: No    Home Medications Prior to Admission medications   Medication Sig Start Date End Date Taking? Authorizing Provider  AMBULATORY NON FORMULARY MEDICATION Rolling Knee Scooter, please take Rx to medical supply store. 05/15/20   Myra Rude, MD  amLODipine (NORVASC) 10 MG tablet Take 1 tablet (10 mg total) by mouth daily. 07/12/20   Nelwyn Salisbury, MD  EPINEPHrine (EPIPEN 2-PAK) 0.3 mg/0.3 mL IJ SOAJ injection Inject 0.3 mLs (0.3 mg total) into the muscle once. 05/18/14   Nelwyn Salisbury, MD  gabapentin (NEURONTIN) 300 MG capsule Take 2 capsules (600 mg total) by mouth 3 (three) times daily. 07/04/20   Nelwyn Salisbury, MD  latanoprost (XALATAN) 0.005 % ophthalmic solution  09/28/19   [provider]  losartan-hydrochlorothiazide (HYZAAR) 100-25 MG  tablet Take 1 tablet by mouth daily. 07/12/20   Nelwyn Salisbury, MD  tretinoin (RETIN-A) 0.05 % cream Apply 1 application topically at bedtime. 01/25/20   [provider]    Allergies    Oxycodone, Shellfish allergy, Amoxicillin, Lisinopril, and Morphine and related  Review of Systems   Review of Systems  Constitutional: Positive for chills and fever.  HENT: Positive for congestion. Negative for rhinorrhea.   Eyes: Negative for redness and visual disturbance.  Respiratory: Positive for cough and shortness of breath. Negative for wheezing.   Cardiovascular: Negative for chest pain and palpitations.  Gastrointestinal: Negative for nausea and vomiting.  Genitourinary: Negative for dysuria and urgency.   Musculoskeletal: Positive for myalgias. Negative for arthralgias.  Skin: Negative for pallor and wound.  Neurological: Negative for dizziness and headaches.    Physical Exam Updated Vital Signs BP (!) 158/90   Pulse (!) 108   Temp (!) 103.2 F (39.6 C) (Oral)   Resp (!) 24   Ht 5\' 5"  (1.651 m)   Wt 68.5 kg   LMP 08/07/2020   SpO2 93%   BMI 25.13 kg/m   Physical Exam Vitals and nursing note reviewed.  Constitutional:      General: She is not in acute distress.    Appearance: She is well-developed and well-nourished. She is not diaphoretic.  HENT:     Head: Normocephalic and atraumatic.  Eyes:     Extraocular Movements: EOM normal.     Pupils: Pupils are equal, round, and reactive to light.  Cardiovascular:     Rate and Rhythm: Regular rhythm. Tachycardia present.     Heart sounds: No murmur heard. No friction rub. No gallop.   Pulmonary:     Effort: Pulmonary effort is normal. Tachypnea present.     Breath sounds: No wheezing or rales.     Comments: Coarse breath sounds bilaterally Abdominal:     General: There is no distension.     Palpations: Abdomen is soft.     Tenderness: There is no abdominal tenderness.  Musculoskeletal:        General: No tenderness or edema.     Cervical back: Normal range of motion and neck supple.  Skin:    General: Skin is warm and dry.  Neurological:     Mental Status: She is alert and oriented to person, place, and time.  Psychiatric:        Mood and Affect: Mood and affect normal.        Behavior: Behavior normal.     ED Results / Procedures / Treatments   Labs (all labs ordered are listed, but only abnormal results are displayed) Labs Reviewed  RESP PANEL BY RT-PCR (FLU A&B, COVID) [id number]  CBC WITH DIFFERENTIAL/PLATELET  COMPREHENSIVE METABOLIC PANEL    EKG None  Radiology No results found.  Procedures Procedures (including critical care time)  Medications Ordered in ED Medications  acetaminophen (TYLENOL)  tablet 1,000 mg (has no administration in time range)  ketorolac (TORADOL) 30 MG/ML injection 30 mg (has no administration in time range)  sodium chloride 0.9 % bolus 1,000 mL (has no administration in time range)  chlorpheniramine-HYDROcodone (TUSSIONEX) 10-8 MG/5ML suspension 5 mL (has no administration in time range)    ED Course  I have reviewed the triage vital signs and the nursing notes.  Pertinent labs & imaging results that were available during my care of the patient were reviewed by me and considered in my medical decision making (see chart for  details).    MDM Rules/Calculators/A&P                          49 yo F with a chief [id number] of cough congestion fever chills myalgias and fatigue.  Ended up calling 911 today and was found to be borderline hypoxic.  Patient has a temperature on arrival of 103.2.  Will give Tylenol and Toradol.  Bolus of IV fluids.  Chest x-ray blood work.  Attempt to ambulate to assess for hypoxia.  Covid test.  I was notified that the patient was dipping into the mid to upper 80s's even at rest.  Placed on 2 L of oxygen.  Chest x-ray viewed by me with multifocal pneumonia.  Awaiting Covid test.   Signed out to Dr. Bernette MayersSheldon, please see his note for further details of care in the ED.   CRITICAL CARE Performed by: Rae Roamaniel Patrick Simran Bomkamp   Total critical care time: 35 minutes  Critical care time was exclusive of separately billable procedures and treating other patients.  Critical care was necessary to treat or prevent imminent or life-threatening deterioration.  Critical care was time spent personally by me on the following activities: development of treatment plan with patient and/or surrogate as well as nursing, discussions with consultants, evaluation of patient's response to treatment, examination of patient, obtaining history from patient or surrogate, ordering and performing treatments and interventions, ordering and review of laboratory  studies, ordering and review of radiographic studies, pulse oximetry and re-evaluation of patient's condition.  The patients results and plan were reviewed and discussed.   Any x-rays performed were independently reviewed by myself.   Differential diagnosis were considered with the presenting HPI.  Medications  methylPREDNISolone sodium succinate (SOLU-MEDROL) 125 mg/2 mL injection 68.75 mg (68.75 mg Intravenous Given 08/08/20 0348)  remdesivir 100 mg in sodium chloride 0.9 % 100 mL IVPB (100 mg Intravenous New Bag/Given 08/08/20 0947)  amLODipine (NORVASC) tablet 10 mg (10 mg Oral Given 08/08/20 0901)  gabapentin (NEURONTIN) capsule 600 mg (600 mg Oral Given 08/08/20 0901)  latanoprost (XALATAN) 0.005 % ophthalmic solution 1 drop (1 drop Both Eyes Given 08/07/20 2229)  enoxaparin (LOVENOX) injection 40 mg (40 mg Subcutaneous Given 08/07/20 2229)  acetaminophen (TYLENOL) tablet 650 mg (650 mg Oral Given 08/08/20 1145)    Or  acetaminophen (TYLENOL) suppository 650 mg ( Rectal See Alternative 08/08/20 1145)  guaiFENesin-dextromethorphan (ROBITUSSIN DM) 100-10 MG/5ML syrup 5 mL (5 mLs Oral Given 08/08/20 1145)  hydrALAZINE (APRESOLINE) injection 10 mg (has no administration in time range)  chlorpheniramine-HYDROcodone (TUSSIONEX) 10-8 MG/5ML suspension 5 mL (5 mLs Oral Given 08/08/20 1437)  ketorolac (TORADOL) 30 MG/ML injection 30 mg (30 mg Intravenous Given 08/07/20 1455)  sodium chloride 0.9 % bolus 1,000 mL (0 mLs Intravenous Stopped 08/07/20 1606)  chlorpheniramine-HYDROcodone (TUSSIONEX) 10-8 MG/5ML suspension 5 mL (5 mLs Oral Given 08/07/20 1444)  remdesivir 100 mg in sodium chloride 0.9 % 100 mL IVPB (0 mg Intravenous Stopped 08/07/20 1752)    Vitals:   08/08/20 0255 08/08/20 0629 08/08/20 1403 08/08/20 1408  BP: (!) 142/90 (!) 148/93 137/84   Pulse: 80 76 83   Resp: 18 18 20    Temp: 97.8 F (36.6 C) 98.2 F (36.8 C) 97.7 F (36.5 C)   TempSrc: Oral     SpO2: 100% 96% (!)  85% 92%  Weight:      Height:        Final diagnoses:  Pneumonia due to COVID-19 virus  Acute respiratory failure with hypoxia [facility])    Admission/ observation were discussed with the admitting physician, patient and/or family and they are comfortable with the plan.    Final Clinical Impression(s) / ED Diagnoses Final diagnoses:  None    Rx / DC Orders ED Discharge Orders    None       Melene Plan, DO 08/08/20 1459

## 2020-08-07 NOTE — ED Notes (Signed)
Tylenol not given, pt took 3 tylenol pta

## 2020-08-08 ENCOUNTER — Telehealth: Payer: 59 | Admitting: Family Medicine

## 2020-08-08 DIAGNOSIS — N182 Chronic kidney disease, stage 2 (mild): Secondary | ICD-10-CM

## 2020-08-08 LAB — COMPREHENSIVE METABOLIC PANEL
ALT: 30 U/L (ref 0–44)
AST: 47 U/L — ABNORMAL HIGH (ref 15–41)
Albumin: 3 g/dL — ABNORMAL LOW (ref 3.5–5.0)
Alkaline Phosphatase: 68 U/L (ref 38–126)
Anion gap: 14 (ref 5–15)
BUN: 14 mg/dL (ref 6–20)
CO2: 23 mmol/L (ref 22–32)
Calcium: 8.6 mg/dL — ABNORMAL LOW (ref 8.9–10.3)
Chloride: 104 mmol/L (ref 98–111)
Creatinine, Ser: 0.98 mg/dL (ref 0.44–1.00)
GFR, Estimated: >60 mL/min (ref >60)
Glucose, Bld: 132 mg/dL — ABNORMAL HIGH (ref 70–99)
Potassium: 3.9 mmol/L (ref 3.5–5.1)
Sodium: 141 mmol/L (ref 135–145)
Total Bilirubin: 0.6 mg/dL (ref 0.3–1.2)
Total Protein: 7.8 g/dL (ref 6.5–8.1)

## 2020-08-08 LAB — CBC WITH DIFFERENTIAL/PLATELET
Abs Immature Granulocytes: 0.15 10*3/uL — ABNORMAL HIGH (ref 0.00–0.07)
Basophils Absolute: 0 10*3/uL (ref 0.0–0.1)
Basophils Relative: 0 %
Eosinophils Absolute: 0 10*3/uL (ref 0.0–0.5)
Eosinophils Relative: 0 %
HCT: 35.3 % — ABNORMAL LOW (ref 36.0–46.0)
Hemoglobin: 11.3 g/dL — ABNORMAL LOW (ref 12.0–15.0)
Immature Granulocytes: 2 %
Lymphocytes Relative: 9 %
Lymphs Abs: 0.8 10*3/uL (ref 0.7–4.0)
MCH: 30.2 pg (ref 26.0–34.0)
MCHC: 32 g/dL (ref 30.0–36.0)
MCV: 94.4 fL (ref 80.0–100.0)
Monocytes Absolute: 0.4 10*3/uL (ref 0.1–1.0)
Monocytes Relative: 5 %
Neutro Abs: 7 10*3/uL (ref 1.7–7.7)
Neutrophils Relative %: 84 %
Platelets: 439 10*3/uL — ABNORMAL HIGH (ref 150–400)
RBC: 3.74 MIL/uL — ABNORMAL LOW (ref 3.87–5.11)
RDW: 13.8 % (ref 11.5–15.5)
WBC: 8.4 10*3/uL (ref 4.0–10.5)
nRBC: 0 % (ref 0.0–0.2)

## 2020-08-08 LAB — C-REACTIVE PROTEIN: CRP: 21.7 mg/dL — ABNORMAL HIGH (ref <1.0)

## 2020-08-08 LAB — D-DIMER, QUANTITATIVE: D-Dimer, Quant: 1.67 ug/mL-FEU — ABNORMAL HIGH (ref 0.00–0.50)

## 2020-08-08 LAB — HIV ANTIBODY (ROUTINE TESTING W REFLEX): HIV Screen 4th Generation wRfx: NONREACTIVE

## 2020-08-08 MED ORDER — HYDROCOD POLST-CPM POLST ER 10-8 MG/5ML PO SUER
5.0000 mL | Freq: Two times a day (BID) | ORAL | Status: DC | PRN
  Administered 2020-08-08 – 2020-08-10 (×5): 5 mL via ORAL
  Filled 2020-08-08 (×5): qty 5

## 2020-08-08 MED ORDER — ENSURE ENLIVE PO LIQD
237.0000 mL | Freq: Two times a day (BID) | ORAL | Status: DC
  Administered 2020-08-09 – 2020-08-11 (×4): 237 mL via ORAL

## 2020-08-08 MED ORDER — HYDRALAZINE HCL 20 MG/ML IJ SOLN: 10.0000 mg | INTRAMUSCULAR | Status: DC | PRN

## 2020-08-08 NOTE — Plan of Care (Signed)
  Problem: Education: Goal: Knowledge of General Education information will improve Description: Including pain rating scale, medication(s)/side effects and non-pharmacologic comfort measures 08/08/2020 0253 by Robyne Peers, RN Outcome: Progressing 08/07/2020 1948 by Robyne Peers, RN Outcome: Progressing   Problem: Health Behavior/Discharge Planning: Goal: Ability to manage health-related needs will improve 08/08/2020 0253 by Robyne Peers, RN Outcome: Progressing 08/07/2020 1948 by Robyne Peers, RN Outcome: Progressing   Problem: Clinical Measurements: Goal: Ability to maintain clinical measurements within normal limits will improve 08/08/2020 0253 by Robyne Peers, RN Outcome: Progressing 08/07/2020 1948 by Robyne Peers, RN Outcome: Progressing Goal: Will remain free from infection 08/08/2020 0253 by Robyne Peers, RN Outcome: Progressing 08/07/2020 1948 by Robyne Peers, RN Outcome: Progressing Goal: Diagnostic test results will improve 08/08/2020 0253 by Robyne Peers, RN Outcome: Progressing 08/07/2020 1948 by Robyne Peers, RN Outcome: Progressing Goal: Respiratory complications will improve 08/08/2020 0253 by Robyne Peers, RN Outcome: Progressing 08/07/2020 1948 by Robyne Peers, RN Outcome: Progressing Goal: Cardiovascular complication will be avoided 08/08/2020 0253 by Robyne Peers, RN Outcome: Progressing 08/07/2020 1948 by Robyne Peers, RN Outcome: Progressing   Problem: Activity: Goal: Risk for activity intolerance will decrease 08/08/2020 0253 by Robyne Peers, RN Outcome: Progressing 08/07/2020 1948 by Robyne Peers, RN Outcome: Progressing   Problem: Nutrition: Goal: Adequate nutrition will be maintained 08/08/2020 0253 by Robyne Peers, RN Outcome: Progressing 08/07/2020 1948 by Robyne Peers, RN Outcome: Progressing   Problem: Coping: Goal: Level of anxiety will decrease 08/08/2020 0253 by Robyne Peers, RN Outcome: Progressing 08/07/2020 1948 by Robyne Peers, RN Outcome: Progressing   Problem: Elimination: Goal: Will not experience complications related to bowel motility 08/08/2020 0253 by Robyne Peers, RN Outcome: Progressing 08/07/2020 1948 by Robyne Peers, RN Outcome: Progressing Goal: Will not experience complications related to urinary retention 08/08/2020 0253 by Robyne Peers, RN Outcome: Progressing 08/07/2020 1948 by Robyne Peers, RN Outcome: Progressing   Problem: Pain Managment: Goal: General experience of comfort will improve 08/08/2020 0253 by Robyne Peers, RN Outcome: Progressing 08/07/2020 1948 by Robyne Peers, RN Outcome: Progressing   Problem: Safety: Goal: Ability to remain free from injury will improve 08/08/2020 0253 by Robyne Peers, RN Outcome: Progressing 08/07/2020 1948 by Robyne Peers, RN Outcome: Progressing   Problem: Skin Integrity: Goal: Risk for impaired skin integrity will decrease 08/08/2020 0253 by Robyne Peers, RN Outcome: Progressing 08/07/2020 1948 by Robyne Peers, RN Outcome: Progressing

## 2020-08-08 NOTE — Plan of Care (Signed)

## 2020-08-08 NOTE — Plan of Care (Signed)
  Problem: Respiratory: Goal: Will maintain a patent airway Outcome: Progressing   Problem: Respiratory: Goal: Complications related to the disease process, condition or treatment will be avoided or minimized Outcome: Progressing   Problem: Coping: Goal: Psychosocial and spiritual needs will be supported Outcome: Progressing   Problem: Education: Goal: Knowledge of risk factors and measures for prevention of condition will improve Outcome: Progressing

## 2020-08-08 NOTE — Progress Notes (Addendum)
PROGRESS NOTE  Cynthia Roach  GBT:[id number] DOB: 1971/06/17 DOA: 08/07/2020 PCP: Nelwyn Salisbury, MD   Brief Narrative: Cynthia Roach is a 49 y.o. female with a history of HTN, trigeminal neuralgia who presented to the ED 12/13 with cough, fever, and shortness of breath worsening over the previous week. She was febrile and hypoxic with bilateral CXR infiltrates and positive SARS-CoV-2 PCR. CRP 16.7. Remdesivir and steroids were started and she was admitted.   Assessment & Plan: Principal Problem:   Pneumonia due to COVID-19 virus Active Problems:   Essential hypertension   Trigeminal neuralgia   CKD (chronic kidney disease), stage II  Acute hypoxemic respiratory failure due to covid-19 pneumonia: SARS-CoV-2 PCR positive on 12/13.  - Oxygen requirement stable, though inflammatory markers elevated. If hypoxia progresses, low threshold to start immunomodulator. PCT is detectable though no leukocytosis or evidence of focal bacterial infection at this time. Will trend PCT x1 more day. - Continue remdesivir   - Continue steroids - Encourage OOB, IS, FV, and awake proning if able - Continue airborne, contact precautions for 21 days from positive testing. - Monitor CMP and inflammatory markers - Enoxaparin prophylactic dose.   Stage II CKD: Based on available Cr, this does not appear to be acute renal failure at this time, but we will continue monitoring.   - Will continue to hold thiazide diuretic and ARB - Restrictive fluid strategy, will avoid nephrotoxins  AST elevation: Mild, possibly viral infection is etiology.  - Monitor  HTN:  - Continue norvasc  Trigeminal neuralgia: Quiescent currently.  - Continue gabapentin  Anemia: Normocytic, unclear etiology though no bleeding.  - Monitor CBC  Hypokalemia:  - Improved.  DVT prophylaxis: Lovenox Code Status: Full Family Communication: None at bedside Disposition Plan:  Status is: Inpatient  Remains inpatient  appropriate because:Inpatient level of care appropriate due to severity of illness  Dispo: The patient is from: Home              Anticipated d/c is to: Home              Anticipated d/c date is: 3 days              Patient currently is not medically stable to d/c.  Consultants:   None  Procedures:   None  Antimicrobials:  Remdesivir   Subjective: Cough remains severe, persistent, and only minimally improved with cough medications. No chest pain reported. Fever has come down.  Objective: Vitals:   08/08/20 0255 08/08/20 0629 08/08/20 1403 08/08/20 1408  BP: (!) 142/90 (!) 148/93 137/84   Pulse: 80 76 83   Resp: 18 18 20    Temp: 97.8 F (36.6 C) 98.2 F (36.8 C) 97.7 F (36.5 C)   TempSrc: Oral     SpO2: 100% 96% (!) 85% 92%  Weight:      Height:        Intake/Output Summary (Last 24 hours) at 08/08/2020 1410 Last data filed at 08/08/2020 1258 Gross per 24 hour  Intake 1436 ml  Output --  Net 1436 ml   Filed Weights   08/07/20 1336  Weight: 68.5 kg   Gen: 49 y.o. female in no distress Pulm: Non-labored breathing supplemental oxygen, SpO2 94% at rest on 2L. Crackles noted minimally. CV: Regular rate and rhythm. No murmur, rub, or gallop. No JVD, no pedal edema. GI: Abdomen soft, non-tender, non-distended, with normoactive bowel sounds. No organomegaly or masses felt. Ext: Warm, no deformities Skin: No  rashes, lesions or ulcers Neuro: Alert and oriented. No focal neurological deficits. Psych: Judgement and insight appear normal. Mood & affect appropriate.   Data Reviewed: I have personally reviewed following labs and imaging studies  CBC: Recent Labs  Lab 08/07/20 1445 08/08/20 0343  WBC 10.3 8.4  NEUTROABS 9.2* 7.0  HGB 11.7* 11.3*  HCT 35.5* 35.3*  MCV 91.3 94.4  PLT 413* 439*   Basic Metabolic Panel: Recent Labs  Lab 08/07/20 1445 08/08/20 0343  NA 138 141  K 3.4* 3.9  CL 99 104  CO2 28 23  GLUCOSE 104* 132*  BUN 13 14  CREATININE  1.04* 0.98  CALCIUM 8.5* 8.6*   GFR: Estimated Creatinine Clearance: 68.3 mL/min (by C-G formula based on SCr of 0.98 mg/dL). Liver Function Tests: Recent Labs  Lab 08/07/20 1445 08/08/20 0343  AST 53* 47*  ALT 30 30  ALKPHOS 71 68  BILITOT 0.6 0.6  PROT 7.7 7.8  ALBUMIN 3.1* 3.0*   No results for input(s): LIPASE, AMYLASE in the last 168 hours. No results for input(s): AMMONIA in the last 168 hours. Coagulation Profile: No results for input(s): INR, PROTIME in the last 168 hours. Cardiac Enzymes: No results for input(s): CKTOTAL, CKMB, CKMBINDEX, TROPONINI in the last 168 hours. BNP (last 3 results) No results for input(s): PROBNP in the last 8760 hours. HbA1C: No results for input(s): HGBA1C in the last 72 hours. CBG: No results for input(s): GLUCAP in the last 168 hours. Lipid Profile: Recent Labs    08/07/20 1609  TRIG 133   Thyroid Function Tests: No results for input(s): TSH, T4TOTAL, FREET4, T3FREE, THYROIDAB in the last 72 hours. Anemia Panel: Recent Labs    08/07/20 1609  FERRITIN 297   Urine analysis:    Component Value Date/Time   COLORURINE YELLOW 09/15/2016 2005   APPEARANCEUR CLEAR 09/15/2016 2005   LABSPEC 1.021 09/15/2016 2005   PHURINE 7.0 09/15/2016 2005   GLUCOSEU NEGATIVE 09/15/2016 2005   HGBUR MODERATE (A) 09/15/2016 2005   BILIRUBINUR NEGATIVE 09/15/2016 2005   BILIRUBINUR neg 08/12/2011 1445   KETONESUR NEGATIVE 09/15/2016 2005   PROTEINUR NEGATIVE 09/15/2016 2005   UROBILINOGEN 0.2 05/06/2015 1800   NITRITE NEGATIVE 09/15/2016 2005   LEUKOCYTESUR NEGATIVE 09/15/2016 2005   Recent Results (from the past 240 hour(s))  Resp Panel by RT-PCR (Flu A&B, Covid) Nasopharyngeal Swab     Status: Abnormal   Collection Time: 08/07/20  2:45 PM   Specimen: Nasopharyngeal Swab; Nasopharyngeal(NP) swabs in vial transport medium  Result Value Ref Range Status   SARS Coronavirus 2 by RT PCR POSITIVE (A) NEGATIVE Final    Comment: RESULT CALLED  TO, READ BACK BY AND VERIFIED WITH: SIMMS MARVA, RN @1542  ON 08/07/2020, [email or web address] (NOTE) SARS-CoV-2 target nucleic acids are DETECTED.  The SARS-CoV-2 RNA is generally detectable in upper respiratory specimens during the acute phase of infection. Positive results are indicative of the presence of the identified virus, but do not rule out bacterial infection or co-infection with other pathogens not detected by the test. Clinical correlation with patient history and other diagnostic information is necessary to determine patient infection status. The expected result is Negative.  Fact Sheet for Patients: 08/09/2020  Fact Sheet for Healthcare Providers: BloggerCourse.com  This test is not yet approved or cleared by the [place] FDA and  has been authorized for detection and/or diagnosis of SARS-CoV-2 by FDA under an Emergency Use Authorization (EUA).  This EUA will remain in effect (meaning t his test can  be used) for the duration of  the COVID-19 declaration under Section 564(b)(1) of the Act, 21 U.S.C. section 360bbb-3(b)(1), unless the authorization is terminated or revoked sooner.     Influenza A by PCR NEGATIVE NEGATIVE Final   Influenza B by PCR NEGATIVE NEGATIVE Final    Comment: (NOTE) The Xpert Xpress SARS-CoV-2/FLU/RSV plus assay is intended as an aid in the diagnosis of influenza from Nasopharyngeal swab specimens and should not be used as a sole basis for treatment. Nasal washings and aspirates are unacceptable for Xpert Xpress SARS-CoV-2/FLU/RSV testing.  Fact Sheet for Patients: BloggerCourse.com  Fact Sheet for Healthcare Providers: SeriousBroker.it  This test is not yet approved or cleared by the [place] FDA and has been authorized for detection and/or diagnosis of SARS-CoV-2 by FDA under an Emergency Use Authorization (EUA). This EUA will  remain in effect (meaning this test can be used) for the duration of the COVID-19 declaration under Section 564(b)(1) of the Act, 21 U.S.C. section 360bbb-3(b)(1), unless the authorization is terminated or revoked.  Performed at [facility], [address], [place], [place] [address]       Radiology Studies: DG Chest Portable 1 View  Result Date: 08/07/2020 CLINICAL DATA:  Cough and shortness of breath EXAM: PORTABLE CHEST 1 VIEW COMPARISON:  July 19, 2016 FINDINGS: There is airspace opacity in portions of the left mid and lower lung regions as well as in the right perihilar region. Heart size and pulmonary vascularity are normal. No adenopathy. No bone lesions. IMPRESSION: Multifocal pneumonia, more severe on the left than on the right. Advise check of COVID-19 status given this appearance, although bacterial pneumonia could present in this manner. Heart size normal. No adenopathy. Electronically Signed   By: Bretta Bang III M.D.   On: 08/07/2020 14:27    Scheduled Meds: . amLODipine  10 mg Oral Daily  . enoxaparin (LOVENOX) injection  40 mg Subcutaneous Q24H  . gabapentin  600 mg Oral TID  . latanoprost  1 drop Both Eyes QHS  . methylPREDNISolone (SOLU-MEDROL) injection  1 mg/kg Intravenous Q12H   Continuous Infusions: . remdesivir 100 mg in NS 100 mL 100 mg (08/08/20 0947)     LOS: 1 day   Time spent: 35 minutes.  Tyrone Nine, MD [facility] [email or web address] 08/08/2020, 2:10 PM

## 2020-08-08 NOTE — Progress Notes (Signed)
Initial Nutrition Assessment  INTERVENTION:   -Ensure Enlive po BID, each supplement provides 350 kcal and 20 grams of protein  NUTRITION DIAGNOSIS:   Increased nutrient needs related to acute illness (COVID-19 infection) as evidenced by estimated needs.  GOAL:   Patient will meet greater than or equal to 90% of their needs  MONITOR:   PO intake,Supplement acceptance,Labs,Weight trends,I & O's  REASON FOR ASSESSMENT:   Malnutrition Screening Tool    ASSESSMENT:   49 y.o. female with a history of HTN, trigeminal neuralgia who presented to the ED 12/13 with cough, fever, and shortness of breath worsening over the previous week. She was febrile and hypoxic with bilateral CXR infiltrates and positive SARS-CoV-2 PCR. CRP 16.7. Remdesivir and steroids were started and she was admitted.  COVID-19+ since 12/13.  Patient reported eating and drinking PTA but had noticed a decrease in appetite.  Pt did eat 100% of her lunch today. Will order Ensure supplements given increased needs from active COVID-19 infection.   Per weight records, pt has lost 32 lbs since 2/12 (17% wt loss x 10 months, significant for time frame).  Labs reviewed. Medications reviewed.  NUTRITION - FOCUSED PHYSICAL EXAM:  Unable to complete  Diet Order:   Diet Order            Diet Heart Room service appropriate? Yes; Fluid consistency: Thin  Diet effective now                 EDUCATION NEEDS:   No education needs have been identified at this time  Skin:  Skin Assessment: Reviewed RN Assessment  Last BM:  PTA  Height:   Ht Readings from Last 1 Encounters:  08/07/20 5\' 5"  (1.651 m)    Weight:   Wt Readings from Last 1 Encounters:  08/07/20 68.5 kg    BMI:  Body mass index is 25.13 kg/m.  Estimated Nutritional Needs:   Kcal:  1750-1950  Protein:  85-100g  Fluid:  1.9L/day  08/09/20, MS, RD, LDN Inpatient Clinical Dietitian Contact information available via Amion

## 2020-08-09 LAB — CBC WITH DIFFERENTIAL/PLATELET
Abs Immature Granulocytes: 0.25 10*3/uL — ABNORMAL HIGH (ref 0.00–0.07)
Basophils Absolute: 0 10*3/uL (ref 0.0–0.1)
Basophils Relative: 0 %
Eosinophils Absolute: 0 10*3/uL (ref 0.0–0.5)
Eosinophils Relative: 0 %
HCT: 34.5 % — ABNORMAL LOW (ref 36.0–46.0)
Hemoglobin: 11.3 g/dL — ABNORMAL LOW (ref 12.0–15.0)
Immature Granulocytes: 2 %
Lymphocytes Relative: 7 %
Lymphs Abs: 1.1 10*3/uL (ref 0.7–4.0)
MCH: 30.5 pg (ref 26.0–34.0)
MCHC: 32.8 g/dL (ref 30.0–36.0)
MCV: 93.2 fL (ref 80.0–100.0)
Monocytes Absolute: 0.9 10*3/uL (ref 0.1–1.0)
Monocytes Relative: 6 %
Neutro Abs: 13.1 10*3/uL — ABNORMAL HIGH (ref 1.7–7.7)
Neutrophils Relative %: 85 %
Platelets: 597 10*3/uL — ABNORMAL HIGH (ref 150–400)
RBC: 3.7 MIL/uL — ABNORMAL LOW (ref 3.87–5.11)
RDW: 13.8 % (ref 11.5–15.5)
WBC: 15.4 10*3/uL — ABNORMAL HIGH (ref 4.0–10.5)
nRBC: 0 % (ref 0.0–0.2)

## 2020-08-09 LAB — C-REACTIVE PROTEIN: CRP: 11 mg/dL — ABNORMAL HIGH (ref <1.0)

## 2020-08-09 LAB — COMPREHENSIVE METABOLIC PANEL
ALT: 28 U/L (ref 0–44)
AST: 39 U/L (ref 15–41)
Albumin: 2.9 g/dL — ABNORMAL LOW (ref 3.5–5.0)
Alkaline Phosphatase: 65 U/L (ref 38–126)
Anion gap: 11 (ref 5–15)
BUN: 23 mg/dL — ABNORMAL HIGH (ref 6–20)
CO2: 26 mmol/L (ref 22–32)
Calcium: 9.3 mg/dL (ref 8.9–10.3)
Chloride: 104 mmol/L (ref 98–111)
Creatinine, Ser: 0.98 mg/dL (ref 0.44–1.00)
GFR, Estimated: >60 mL/min (ref >60)
Glucose, Bld: 141 mg/dL — ABNORMAL HIGH (ref 70–99)
Potassium: 4 mmol/L (ref 3.5–5.1)
Sodium: 141 mmol/L (ref 135–145)
Total Bilirubin: 0.3 mg/dL (ref 0.3–1.2)
Total Protein: 7.5 g/dL (ref 6.5–8.1)

## 2020-08-09 LAB — D-DIMER, QUANTITATIVE: D-Dimer, Quant: 1.47 ug/mL-FEU — ABNORMAL HIGH (ref 0.00–0.50)

## 2020-08-09 MED ORDER — ZOLPIDEM TARTRATE 5 MG PO TABS
5.0000 mg | ORAL_TABLET | Freq: Every evening | ORAL | Status: DC | PRN
  Administered 2020-08-09 – 2020-08-10 (×2): 5 mg via ORAL
  Filled 2020-08-09 (×2): qty 1

## 2020-08-09 MED ORDER — SODIUM CHLORIDE 0.9 % IV SOLN
500.0000 mg | INTRAVENOUS | Status: DC
  Administered 2020-08-09 – 2020-08-11 (×3): 500 mg via INTRAVENOUS
  Filled 2020-08-09 (×3): qty 500

## 2020-08-09 MED ORDER — SODIUM CHLORIDE 0.9 % IV SOLN
1.0000 g | INTRAVENOUS | Status: DC
  Administered 2020-08-09 – 2020-08-11 (×3): 1 g via INTRAVENOUS
  Filled 2020-08-09 (×3): qty 1

## 2020-08-09 NOTE — Progress Notes (Signed)
PROGRESS NOTE  Cynthia Roach  MWU:[id number] DOB: 06/08/1971 DOA: 08/07/2020 PCP: Nelwyn Salisbury, MD   Brief Narrative: Cynthia Roach is a 49 y.o. female with a history of HTN, trigeminal neuralgia who presented to the ED 12/13 with cough, fever, and shortness of breath worsening over the previous week. She was febrile and hypoxic with bilateral CXR infiltrates and positive SARS-CoV-2 PCR. CRP 16.7. Remdesivir and steroids were started and she was admitted.   Subjective:  Has improved, dyspnea has improved as well, she denies any chest pain.  Does report some insomnia and asking for some sleep aid.   Assessment & Plan: Principal Problem:   Pneumonia due to COVID-19 virus Active Problems:   Essential hypertension   Trigeminal neuralgia   CKD (chronic kidney disease), stage II  Acute hypoxemic respiratory failure due to covid-19 pneumonia: SARS-CoV-2 PCR positive on 12/13.  - Oxygen requirement stable, though inflammatory markers elevated.  Initially with oxygen requirement 2 L nasal cannula, this appears to be resolved today, she is on room air at rest, discussed with staff, they will try to ambulate here to see if she has any oxygen requirement with activity. . -Procalcitonin 0.28, will start on Rocephin and azithromycin. - Continue remdesivir   - Continue steroids - Encourage OOB, IS, FV, and awake proning if able - Monitor CMP and inflammatory markers - Enoxaparin prophylactic dose.   Stage II CKD: Based on available Cr, this does not appear to be acute renal failure at this time, but we will continue monitoring.   - Will continue to hold thiazide diuretic and ARB - Restrictive fluid strategy, will avoid nephrotoxins  AST elevation: Mild, possibly viral infection is etiology.  - resolved  HTN:  - Continue norvasc  Trigeminal neuralgia: Quiescent currently.  - Continue gabapentin  Anemia: Normocytic, unclear etiology though no bleeding.  - Monitor  CBC  Hypokalemia:  - Improved.  DVT prophylaxis: Lovenox Code Status: Full Family Communication: Yes with the patient and answered her questions Disposition Plan:  Status is: Inpatient  Remains inpatient appropriate because:Inpatient level of care appropriate due to severity of illness  Dispo: The patient is from: Home              Anticipated d/c is to: Home              Anticipated d/c date is: 2 days              Patient currently is not medically stable to d/c.  Consultants:   None  Procedures:   None  Antimicrobials:  Remdesivir     Objective: Vitals:   08/09/20 0315 08/09/20 0644 08/09/20 1004 08/09/20 1350  BP: (!) 147/94  (!) 154/88 126/74  Pulse: 63  79 78  Resp: 16  20 18   Temp: 98.1 F (36.7 C)   98 F (36.7 C)  TempSrc:    Oral  SpO2: (!) 89% 100% 100% (!) 79%  Weight:      Height:        Intake/Output Summary (Last 24 hours) at 08/09/2020 1446 Last data filed at 08/09/2020 0935 Gross per 24 hour  Intake 934 ml  Output --  Net 934 ml   Filed Weights   08/07/20 1336  Weight: 68.5 kg    Awake Alert, Oriented X 3, No new F.N deficits, Normal affect Symmetrical Chest wall movement, Good air movement bilaterally,scattered rales RRR,No Gallops,Rubs or new Murmurs, No Parasternal Heave +ve B.Sounds, Abd Soft, No tenderness,  No rebound - guarding or rigidity. No Cyanosis, Clubbing or edema, No new Rash or bruise     Data Reviewed: I have personally reviewed following labs and imaging studies  CBC: Recent Labs  Lab 08/07/20 1445 08/08/20 0343 08/09/20 0407  WBC 10.3 8.4 15.4*  NEUTROABS 9.2* 7.0 13.1*  HGB 11.7* 11.3* 11.3*  HCT 35.5* 35.3* 34.5*  MCV 91.3 94.4 93.2  PLT 413* 439* 597*   Basic Metabolic Panel: Recent Labs  Lab 08/07/20 1445 08/08/20 0343 08/09/20 0407  NA 138 141 141  K 3.4* 3.9 4.0  CL 99 104 104  CO2 28 23 26   GLUCOSE 104* 132* 141*  BUN 13 14 23*  CREATININE 1.04* 0.98 0.98  CALCIUM 8.5* 8.6* 9.3    GFR: Estimated Creatinine Clearance: 68.3 mL/min (by C-G formula based on SCr of 0.98 mg/dL). Liver Function Tests: Recent Labs  Lab 08/07/20 1445 08/08/20 0343 08/09/20 0407  AST 53* 47* 39  ALT 30 30 28   ALKPHOS 71 68 65  BILITOT 0.6 0.6 0.3  PROT 7.7 7.8 7.5  ALBUMIN 3.1* 3.0* 2.9*   No results for input(s): LIPASE, AMYLASE in the last 168 hours. No results for input(s): AMMONIA in the last 168 hours. Coagulation Profile: No results for input(s): INR, PROTIME in the last 168 hours. Cardiac Enzymes: No results for input(s): CKTOTAL, CKMB, CKMBINDEX, TROPONINI in the last 168 hours. BNP (last 3 results) No results for input(s): PROBNP in the last 8760 hours. HbA1C: No results for input(s): HGBA1C in the last 72 hours. CBG: No results for input(s): GLUCAP in the last 168 hours. Lipid Profile: Recent Labs    08/07/20 1609  TRIG 133   Thyroid Function Tests: No results for input(s): TSH, T4TOTAL, FREET4, T3FREE, THYROIDAB in the last 72 hours. Anemia Panel: Recent Labs    08/07/20 1609  FERRITIN 297   Urine analysis:    Component Value Date/Time   COLORURINE YELLOW 09/15/2016 2005   APPEARANCEUR CLEAR 09/15/2016 2005   LABSPEC 1.021 09/15/2016 2005   PHURINE 7.0 09/15/2016 2005   GLUCOSEU NEGATIVE 09/15/2016 2005   HGBUR MODERATE (A) 09/15/2016 2005   BILIRUBINUR NEGATIVE 09/15/2016 2005   BILIRUBINUR neg 08/12/2011 1445   KETONESUR NEGATIVE 09/15/2016 2005   PROTEINUR NEGATIVE 09/15/2016 2005   UROBILINOGEN 0.2 05/06/2015 1800   NITRITE NEGATIVE 09/15/2016 2005   LEUKOCYTESUR NEGATIVE 09/15/2016 2005   Recent Results (from the past 240 hour(s))  Resp Panel by RT-PCR (Flu A&B, Covid) Nasopharyngeal Swab     Status: Abnormal   Collection Time: 08/07/20  2:45 PM   Specimen: Nasopharyngeal Swab; Nasopharyngeal(NP) swabs in vial transport medium  Result Value Ref Range Status   SARS Coronavirus 2 by RT PCR POSITIVE (A) NEGATIVE Final    Comment: RESULT  CALLED TO, READ BACK BY AND VERIFIED WITH: SIMMS MARVA, RN @1542  ON 08/07/2020, [email or web address] (NOTE) SARS-CoV-2 target nucleic acids are DETECTED.  The SARS-CoV-2 RNA is generally detectable in upper respiratory specimens during the acute phase of infection. Positive results are indicative of the presence of the identified virus, but do not rule out bacterial infection or co-infection with other pathogens not detected by the test. Clinical correlation with patient history and other diagnostic information is necessary to determine patient infection status. The expected result is Negative.  Fact Sheet for Patients: BloggerCourse.comhttps://www.fda.gov/media/152166/download  Fact Sheet for Healthcare Providers: SeriousBroker.ithttps://www.fda.gov/media/152162/download  This test is not yet approved or cleared by the [place] FDA and  has been authorized for detection and/or diagnosis  of SARS-CoV-2 by FDA under an Emergency Use Authorization (EUA).  This EUA will remain in effect (meaning t his test can be used) for the duration of  the COVID-19 declaration under Section 564(b)(1) of the Act, 21 U.S.C. section 360bbb-3(b)(1), unless the authorization is terminated or revoked sooner.     Influenza A by PCR NEGATIVE NEGATIVE Final   Influenza B by PCR NEGATIVE NEGATIVE Final    Comment: (NOTE) The Xpert Xpress SARS-CoV-2/FLU/RSV plus assay is intended as an aid in the diagnosis of influenza from Nasopharyngeal swab specimens and should not be used as a sole basis for treatment. Nasal washings and aspirates are unacceptable for Xpert Xpress SARS-CoV-2/FLU/RSV testing.  Fact Sheet for Patients: BloggerCourse.com  Fact Sheet for Healthcare Providers: SeriousBroker.it  This test is not yet approved or cleared by the [place] FDA and has been authorized for detection and/or diagnosis of SARS-CoV-2 by FDA under an Emergency Use Authorization (EUA). This EUA  will remain in effect (meaning this test can be used) for the duration of the COVID-19 declaration under Section 564(b)(1) of the Act, 21 U.S.C. section 360bbb-3(b)(1), unless the authorization is terminated or revoked.  Performed at [facility], [address] [place], [place] [address]   Blood Culture (routine x 2)     Status: None (Preliminary result)   Collection Time: 08/07/20  4:38 PM   Specimen: BLOOD  Result Value Ref Range Status   Specimen Description   Final    BLOOD BLOOD RIGHT FOREARM Performed at [facility], [address] [place], [place] [address]    Special Requests   Final    BOTTLES DRAWN AEROBIC AND ANAEROBIC Blood Culture results may not be optimal due to an inadequate volume of blood received in culture bottles Performed at [facility], [address] [place], [place] [address]    Culture   Final    NO GROWTH 2 DAYS Performed at [facility], [address], [place], [place] [address]    Report Status PENDING  Incomplete  Blood Culture (routine x 2)     Status: None (Preliminary result)   Collection Time: 08/07/20  4:40 PM   Specimen: BLOOD  Result Value Ref Range Status   Specimen Description   Final    BLOOD BLOOD LEFT HAND Performed at [facility], [address] [place], [place] [address]    Special Requests   Final    BOTTLES DRAWN AEROBIC AND ANAEROBIC Blood Culture adequate volume Performed at [facility], [address] [place], [place] [address]    Culture   Final    NO GROWTH 2 DAYS Performed at [facility], [address] [place], [place] [address]    Report Status PENDING  Incomplete      Radiology Studies: No results found.  Scheduled Meds: . amLODipine  10 mg Oral Daily  . enoxaparin (LOVENOX) injection  40 mg Subcutaneous Q24H  . feeding supplement  237 mL Oral BID BM  . gabapentin  600 mg Oral TID  . latanoprost  1 drop Both Eyes QHS  .  methylPREDNISolone (SOLU-MEDROL) injection  1 mg/kg Intravenous Q12H   Continuous Infusions: . azithromycin 500 mg (08/09/20 0956)  . cefTRIAXone (ROCEPHIN)  IV 1 g (08/09/20 0840)  . remdesivir 100 mg in NS 100 mL Stopped (08/08/20 1017)     LOS: 2 days   Huey Bienenstock, MD [facility] [email or web address] 08/09/2020, 2:46 PM

## 2020-08-09 NOTE — Plan of Care (Signed)
  Problem: Nutrition: Goal: Adequate nutrition will be maintained Outcome: Progressing   Problem: Safety: Goal: Ability to remain free from injury will improve Outcome: Progressing   Problem: Coping: Goal: Psychosocial and spiritual needs will be supported Outcome: Progressing   Problem: Respiratory: Goal: Will maintain a patent airway Outcome: Progressing

## 2020-08-10 LAB — COMPREHENSIVE METABOLIC PANEL
ALT: 138 U/L — ABNORMAL HIGH (ref 0–44)
AST: 109 U/L — ABNORMAL HIGH (ref 15–41)
Albumin: 2.7 g/dL — ABNORMAL LOW (ref 3.5–5.0)
Alkaline Phosphatase: 63 U/L (ref 38–126)
Anion gap: 11 (ref 5–15)
BUN: 25 mg/dL — ABNORMAL HIGH (ref 6–20)
CO2: 26 mmol/L (ref 22–32)
Calcium: 8.9 mg/dL (ref 8.9–10.3)
Chloride: 105 mmol/L (ref 98–111)
Creatinine, Ser: 0.96 mg/dL (ref 0.44–1.00)
GFR, Estimated: >60 mL/min (ref >60)
Glucose, Bld: 143 mg/dL — ABNORMAL HIGH (ref 70–99)
Potassium: 4.3 mmol/L (ref 3.5–5.1)
Sodium: 142 mmol/L (ref 135–145)
Total Bilirubin: 0.3 mg/dL (ref 0.3–1.2)
Total Protein: 7 g/dL (ref 6.5–8.1)

## 2020-08-10 LAB — D-DIMER, QUANTITATIVE: D-Dimer, Quant: 0.97 ug/mL-FEU — ABNORMAL HIGH (ref 0.00–0.50)

## 2020-08-10 LAB — CBC WITH DIFFERENTIAL/PLATELET
Abs Immature Granulocytes: 0.3 10*3/uL — ABNORMAL HIGH (ref 0.00–0.07)
Basophils Absolute: 0 10*3/uL (ref 0.0–0.1)
Basophils Relative: 0 %
Eosinophils Absolute: 0 10*3/uL (ref 0.0–0.5)
Eosinophils Relative: 0 %
HCT: 32.3 % — ABNORMAL LOW (ref 36.0–46.0)
Hemoglobin: 10.5 g/dL — ABNORMAL LOW (ref 12.0–15.0)
Immature Granulocytes: 2 %
Lymphocytes Relative: 6 %
Lymphs Abs: 1.1 10*3/uL (ref 0.7–4.0)
MCH: 30.8 pg (ref 26.0–34.0)
MCHC: 32.5 g/dL (ref 30.0–36.0)
MCV: 94.7 fL (ref 80.0–100.0)
Monocytes Absolute: 0.8 10*3/uL (ref 0.1–1.0)
Monocytes Relative: 4 %
Neutro Abs: 17.1 10*3/uL — ABNORMAL HIGH (ref 1.7–7.7)
Neutrophils Relative %: 88 %
Platelets: 600 10*3/uL — ABNORMAL HIGH (ref 150–400)
RBC: 3.41 MIL/uL — ABNORMAL LOW (ref 3.87–5.11)
RDW: 14 % (ref 11.5–15.5)
WBC: 19.3 10*3/uL — ABNORMAL HIGH (ref 4.0–10.5)
nRBC: 0.1 % (ref 0.0–0.2)

## 2020-08-10 LAB — GLUCOSE, CAPILLARY: Glucose-Capillary: 139 mg/dL — ABNORMAL HIGH (ref 70–99)

## 2020-08-10 LAB — C-REACTIVE PROTEIN: CRP: 5 mg/dL — ABNORMAL HIGH (ref <1.0)

## 2020-08-10 NOTE — Progress Notes (Signed)
PROGRESS NOTE  Cynthia Roach  QQP:[id number] DOB: [birth year] DOA: 08/07/2020 PCP: Nelwyn Salisbury, MD   Brief Narrative: Cynthia Roach is a 49 y.o. female with a history of HTN, trigeminal neuralgia who presented to the ED 12/13 with cough, fever, and shortness of breath worsening over the previous week. She was febrile and hypoxic with bilateral CXR infiltrates and positive SARS-CoV-2 PCR. CRP 16.7. Remdesivir and steroids were started and she was admitted.   Subjective:  Ports she is feeling much better today, dyspnea has resolved at rest, minimal with activity, still she reports some cough .    Assessment & Plan: Principal Problem:   Pneumonia due to COVID-19 virus Active Problems:   Essential hypertension   Trigeminal neuralgia   CKD (chronic kidney disease), stage II  Acute hypoxemic respiratory failure due to covid-19 pneumonia: SARS-CoV-2 PCR positive on 12/13.  - Oxygen requirement stable, though inflammatory markers elevated.  Initially with oxygen requirement 2 L nasal cannula, this appears to be resolved, she is with no oxygen requirement over last 48 hours. -Procalcitonin 0.28, started on Rocephin and azithromycin. - Continue remdesivir day 4/ 5 - Continue steroids - Encourage OOB, IS, FV, and awake proning if able - Monitor CMP and inflammatory markers - Enoxaparin prophylactic dose.  -Leukocytosis in the setting of steroids.  Stage II CKD: Based on available Cr, this does not appear to be acute renal failure at this time, but we will continue monitoring.   - Will continue to hold thiazide diuretic and ARB - Restrictive fluid strategy, will avoid nephrotoxins  AST elevation: Mild, possibly viral infection is etiology.  - resolved  HTN:  - Continue norvasc  Trigeminal neuralgia: Quiescent currently.  - Continue gabapentin  Anemia: Normocytic, unclear etiology though no bleeding.  - Monitor CBC  Hypokalemia:  - Improved.  DVT prophylaxis:  Lovenox Code Status: Full Family Communication: Discussed with the patient and answered her questions Disposition Plan:  Status is: Inpatient  Remains inpatient appropriate because:Inpatient level of care appropriate due to severity of illness  Dispo: The patient is from: Home              Anticipated d/c is to: Home              Anticipated d/c date is: 1 day              Patient currently is not medically stable to d/c.  Consultants:   None  Procedures:   None  Antimicrobials:  Remdesivir     Objective: Vitals:   08/09/20 1451 08/09/20 2019 08/09/20 2025 08/10/20 0438  BP:  (!) 146/93  140/88  Pulse:  85  66  Resp:  18  16  Temp:  98 F (36.7 C)  98 F (36.7 C)  TempSrc:      SpO2: 100% (!) 88% 100% 96%  Weight:      Height:        Intake/Output Summary (Last 24 hours) at 08/10/2020 1357 Last data filed at 08/09/2020 2050 Gross per 24 hour  Intake 507.89 ml  Output --  Net 507.89 ml   Filed Weights   08/07/20 1336  Weight: 68.5 kg    Awake Alert, Oriented X 3, No new F.N deficits, Normal affect Symmetrical Chest wall movement, Good air movement bilaterally, CTAB RRR +ve B.Sounds, Abd Soft, No tenderness, No rebound - guarding or rigidity. No Cyanosis, Clubbing or edema, No new Rash or bruise     Data Reviewed:  I have personally reviewed following labs and imaging studies  CBC: Recent Labs  Lab 08/07/20 1445 08/08/20 0343 08/09/20 0407 08/10/20 0400  WBC 10.3 8.4 15.4* 19.3*  NEUTROABS 9.2* 7.0 13.1* 17.1*  HGB 11.7* 11.3* 11.3* 10.5*  HCT 35.5* 35.3* 34.5* 32.3*  MCV 91.3 94.4 93.2 94.7  PLT 413* 439* 597* 600*   Basic Metabolic Panel: Recent Labs  Lab 08/07/20 1445 08/08/20 0343 08/09/20 0407 08/10/20 0400  NA 138 141 141 142  K 3.4* 3.9 4.0 4.3  CL 99 104 104 105  CO2 28 23 26 26   GLUCOSE 104* 132* 141* 143*  BUN 13 14 23* 25*  CREATININE 1.04* 0.98 0.98 0.96  CALCIUM 8.5* 8.6* 9.3 8.9   GFR: Estimated Creatinine  Clearance: 69.7 mL/min (by C-G formula based on SCr of 0.96 mg/dL). Liver Function Tests: Recent Labs  Lab 08/07/20 1445 08/08/20 0343 08/09/20 0407 08/10/20 0400  AST 53* 47* 39 109*  ALT 30 30 28  138*  ALKPHOS 71 68 65 63  BILITOT 0.6 0.6 0.3 0.3  PROT 7.7 7.8 7.5 7.0  ALBUMIN 3.1* 3.0* 2.9* 2.7*   No results for input(s): LIPASE, AMYLASE in the last 168 hours. No results for input(s): AMMONIA in the last 168 hours. Coagulation Profile: No results for input(s): INR, PROTIME in the last 168 hours. Cardiac Enzymes: No results for input(s): CKTOTAL, CKMB, CKMBINDEX, TROPONINI in the last 168 hours. BNP (last 3 results) No results for input(s): PROBNP in the last 8760 hours. HbA1C: No results for input(s): HGBA1C in the last 72 hours. CBG: Recent Labs  Lab 08/10/20 0740  GLUCAP 139*   Lipid Profile: Recent Labs    08/07/20 1609  TRIG 133   Thyroid Function Tests: No results for input(s): TSH, T4TOTAL, FREET4, T3FREE, THYROIDAB in the last 72 hours. Anemia Panel: Recent Labs    08/07/20 1609  FERRITIN 297   Urine analysis:    Component Value Date/Time   COLORURINE YELLOW 09/15/2016 2005   APPEARANCEUR CLEAR 09/15/2016 2005   LABSPEC 1.021 09/15/2016 2005   PHURINE 7.0 09/15/2016 2005   GLUCOSEU NEGATIVE 09/15/2016 2005   HGBUR MODERATE (A) 09/15/2016 2005   BILIRUBINUR NEGATIVE 09/15/2016 2005   BILIRUBINUR neg 08/12/2011 1445   KETONESUR NEGATIVE 09/15/2016 2005   PROTEINUR NEGATIVE 09/15/2016 2005   UROBILINOGEN 0.2 05/06/2015 1800   NITRITE NEGATIVE 09/15/2016 2005   LEUKOCYTESUR NEGATIVE 09/15/2016 2005   Recent Results (from the past 240 hour(s))  Resp Panel by RT-PCR (Flu A&B, Covid) Nasopharyngeal Swab     Status: Abnormal   Collection Time: 08/07/20  2:45 PM   Specimen: Nasopharyngeal Swab; Nasopharyngeal(NP) swabs in vial transport medium  Result Value Ref Range Status   SARS Coronavirus 2 by RT PCR POSITIVE (A) NEGATIVE Final    Comment:  RESULT CALLED TO, READ BACK BY AND VERIFIED WITH: SIMMS MARVA, RN @1542  ON 08/07/2020, [email or web address] (NOTE) SARS-CoV-2 target nucleic acids are DETECTED.  The SARS-CoV-2 RNA is generally detectable in upper respiratory specimens during the acute phase of infection. Positive results are indicative of the presence of the identified virus, but do not rule out bacterial infection or co-infection with other pathogens not detected by the test. Clinical correlation with patient history and other diagnostic information is necessary to determine patient infection status. The expected result is Negative.  Fact Sheet for Patients: BloggerCourse.comhttps://www.fda.gov/media/152166/download  Fact Sheet for Healthcare Providers: SeriousBroker.ithttps://www.fda.gov/media/152162/download  This test is not yet approved or cleared by the [place] FDA and  has been  authorized for detection and/or diagnosis of SARS-CoV-2 by FDA under an Emergency Use Authorization (EUA).  This EUA will remain in effect (meaning t his test can be used) for the duration of  the COVID-19 declaration under Section 564(b)(1) of the Act, 21 U.S.C. section 360bbb-3(b)(1), unless the authorization is terminated or revoked sooner.     Influenza A by PCR NEGATIVE NEGATIVE Final   Influenza B by PCR NEGATIVE NEGATIVE Final    Comment: (NOTE) The Xpert Xpress SARS-CoV-2/FLU/RSV plus assay is intended as an aid in the diagnosis of influenza from Nasopharyngeal swab specimens and should not be used as a sole basis for treatment. Nasal washings and aspirates are unacceptable for Xpert Xpress SARS-CoV-2/FLU/RSV testing.  Fact Sheet for Patients: BloggerCourse.com  Fact Sheet for Healthcare Providers: SeriousBroker.it  This test is not yet approved or cleared by the [place] FDA and has been authorized for detection and/or diagnosis of SARS-CoV-2 by FDA under an Emergency Use Authorization (EUA). This  EUA will remain in effect (meaning this test can be used) for the duration of the COVID-19 declaration under Section 564(b)(1) of the Act, 21 U.S.C. section 360bbb-3(b)(1), unless the authorization is terminated or revoked.  Performed at [facility], [address] [place], [place] [address]   Blood Culture (routine x 2)     Status: None (Preliminary result)   Collection Time: 08/07/20  4:38 PM   Specimen: BLOOD  Result Value Ref Range Status   Specimen Description   Final    BLOOD BLOOD RIGHT FOREARM Performed at [facility], [address] [place], [place] [address]    Special Requests   Final    BOTTLES DRAWN AEROBIC AND ANAEROBIC Blood Culture results may not be optimal due to an inadequate volume of blood received in culture bottles Performed at [facility], [address] [place], [place] [address]    Culture   Final    NO GROWTH 2 DAYS Performed at [facility], [address] [place], [place] [address]    Report Status PENDING  Incomplete  Blood Culture (routine x 2)     Status: None (Preliminary result)   Collection Time: 08/07/20  4:40 PM   Specimen: BLOOD  Result Value Ref Range Status   Specimen Description   Final    BLOOD BLOOD LEFT HAND Performed at [facility], [address] [place], [place] [address]    Special Requests   Final    BOTTLES DRAWN AEROBIC AND ANAEROBIC Blood Culture adequate volume Performed at [facility], [address] [place], [place] [address]    Culture   Final    NO GROWTH 2 DAYS Performed at [facility], [address], [place], [place] [address]    Report Status PENDING  Incomplete      Radiology Studies: No results found.  Scheduled Meds: . amLODipine  10 mg Oral Daily  . enoxaparin (LOVENOX) injection  40 mg Subcutaneous Q24H  . feeding supplement  237 mL Oral BID BM  . gabapentin  600 mg Oral TID  . latanoprost  1 drop Both Eyes QHS  .  methylPREDNISolone (SOLU-MEDROL) injection  1 mg/kg Intravenous Q12H   Continuous Infusions: . azithromycin 500 mg (08/10/20 1054)  . cefTRIAXone (ROCEPHIN)  IV 1 g (08/10/20 0911)  . remdesivir 100 mg in NS 100 mL 100 mg (08/10/20 1208)     LOS: 3 days  Huey Bienenstock, MD [facility] [email or web address] 08/10/2020, 1:57 PM

## 2020-08-10 NOTE — TOC Initial Note (Signed)
Transition of Care [facility]) - Initial/Assessment Note    Patient Details  Name: Cynthia Roach MRN: [id number] Date of Birth: Dec 16, 1970  Transition of Care [facility]) CM/SW Contact:    Ida Rogue, LCSW Phone Number: 08/10/2020, 2:27 PM  Clinical Narrative:   Received call from Nicholos Johns with Monia Pouch, RN on health team assigned to patient. She is available as a Theatre stage manager.  Call back is [phone]. TOC will continue to follow during the course of hospitalization.                Expected Discharge Plan: Home/Self Care Barriers to Discharge: No Barriers Identified   Patient Goals and CMS Choice        Expected Discharge Plan and Services Expected Discharge Plan: Home/Self Care                                              Prior Living Arrangements/Services                       Activities of Daily Living Home Assistive Devices/Equipment: None ADL Screening (condition at time of admission) Patient's cognitive ability adequate to safely complete daily activities?: Yes Is the patient deaf or have difficulty hearing?: No Does the patient have difficulty seeing, even when wearing glasses/contacts?: No Does the patient have difficulty concentrating, remembering, or making decisions?: No Patient able to express need for assistance with ADLs?: Yes Does the patient have difficulty dressing or bathing?: No Independently performs ADLs?: Yes (appropriate for developmental age) Does the patient have difficulty walking or climbing stairs?: No Weakness of Legs: None Weakness of Arms/Hands: None  Permission Sought/Granted                  Emotional Assessment              Admission diagnosis:  Pneumonia due to COVID-19 virus [U07.1, J12.82] Patient Active Problem List   Diagnosis Date Noted   Pneumonia due to COVID-19 virus 08/07/2020   Foot sprain, left, subsequent encounter 05/16/2020   Cellulitis of face 08/28/2018   CKD (chronic kidney  disease), stage II 09/18/2016   Gestational diabetes 05/18/2014   Trigeminal neuralgia 05/11/2013   GOITER 02/10/2009   Essential hypertension 04/29/2007   HEADACHE 04/29/2007   PCP:  Nelwyn Salisbury, MD Pharmacy:   [facility] # [address] [place] [address] [place] [address] Phone: [phone] Fax: [phone]     Social Determinants of Health (SDOH) Interventions    Readmission Risk Interventions No flowsheet data found.

## 2020-08-11 ENCOUNTER — Telehealth: Payer: Self-pay | Admitting: Family Medicine

## 2020-08-11 LAB — CBC WITH DIFFERENTIAL/PLATELET
Abs Immature Granulocytes: 0.4 10*3/uL — ABNORMAL HIGH (ref 0.00–0.07)
Basophils Absolute: 0 10*3/uL (ref 0.0–0.1)
Basophils Relative: 0 %
Eosinophils Absolute: 0 10*3/uL (ref 0.0–0.5)
Eosinophils Relative: 0 %
HCT: 31.7 % — ABNORMAL LOW (ref 36.0–46.0)
Hemoglobin: 10.4 g/dL — ABNORMAL LOW (ref 12.0–15.0)
Immature Granulocytes: 2 %
Lymphocytes Relative: 5 %
Lymphs Abs: 0.9 10*3/uL (ref 0.7–4.0)
MCH: 30.7 pg (ref 26.0–34.0)
MCHC: 32.8 g/dL (ref 30.0–36.0)
MCV: 93.5 fL (ref 80.0–100.0)
Monocytes Absolute: 0.9 10*3/uL (ref 0.1–1.0)
Monocytes Relative: 5 %
Neutro Abs: 15.4 10*3/uL — ABNORMAL HIGH (ref 1.7–7.7)
Neutrophils Relative %: 88 %
Platelets: 728 10*3/uL — ABNORMAL HIGH (ref 150–400)
RBC: 3.39 MIL/uL — ABNORMAL LOW (ref 3.87–5.11)
RDW: 13.6 % (ref 11.5–15.5)
WBC: 17.6 10*3/uL — ABNORMAL HIGH (ref 4.0–10.5)
nRBC: 0.1 % (ref 0.0–0.2)

## 2020-08-11 LAB — COMPREHENSIVE METABOLIC PANEL
ALT: 94 U/L — ABNORMAL HIGH (ref 0–44)
AST: 40 U/L (ref 15–41)
Albumin: 2.6 g/dL — ABNORMAL LOW (ref 3.5–5.0)
Alkaline Phosphatase: 63 U/L (ref 38–126)
Anion gap: 11 (ref 5–15)
BUN: 23 mg/dL — ABNORMAL HIGH (ref 6–20)
CO2: 24 mmol/L (ref 22–32)
Calcium: 8.6 mg/dL — ABNORMAL LOW (ref 8.9–10.3)
Chloride: 105 mmol/L (ref 98–111)
Creatinine, Ser: 0.84 mg/dL (ref 0.44–1.00)
GFR, Estimated: >60 mL/min (ref >60)
Glucose, Bld: 143 mg/dL — ABNORMAL HIGH (ref 70–99)
Potassium: 4 mmol/L (ref 3.5–5.1)
Sodium: 140 mmol/L (ref 135–145)
Total Bilirubin: 0.5 mg/dL (ref 0.3–1.2)
Total Protein: 6.6 g/dL (ref 6.5–8.1)

## 2020-08-11 LAB — D-DIMER, QUANTITATIVE: D-Dimer, Quant: 0.95 ug/mL-FEU — ABNORMAL HIGH (ref 0.00–0.50)

## 2020-08-11 LAB — C-REACTIVE PROTEIN: CRP: 2.6 mg/dL — ABNORMAL HIGH (ref <1.0)

## 2020-08-11 MED ORDER — PANTOPRAZOLE SODIUM 40 MG PO TBEC: 40.0000 mg | DELAYED_RELEASE_TABLET | Freq: Every day | ORAL | 0 refills | Status: DC

## 2020-08-11 MED ORDER — DEXAMETHASONE 6 MG PO TABS: 6.0000 mg | ORAL_TABLET | Freq: Every day | ORAL | 0 refills | Status: DC

## 2020-08-11 NOTE — Discharge Instructions (Signed)
Person Under Monitoring Name: Cynthia Roach  Location: [address] [place] [address]   Infection Prevention Recommendations for Individuals Confirmed to have, or Being Evaluated for, 2019 Novel Coronavirus (COVID-19) Infection Who Receive Care at Home  Individuals who are confirmed to have, or are being evaluated for, COVID-19 should follow the prevention steps below until a healthcare provider or local or state health department says they can return to normal activities.  Stay home except to get medical care You should restrict activities outside your home, except for getting medical care. Do not go to work, school, or public areas, and do not use public transportation or taxis.  Call ahead before visiting your doctor Before your medical appointment, call the healthcare provider and tell them that you have, or are being evaluated for, COVID-19 infection. This will help the healthcare providers office take steps to keep other people from getting infected. Ask your healthcare provider to call the local or state health department.  Monitor your symptoms Seek prompt medical attention if your illness is worsening (e.g., difficulty breathing). Before going to your medical appointment, call the healthcare provider and tell them that you have, or are being evaluated for, COVID-19 infection. Ask your healthcare provider to call the local or state health department.  Wear a facemask You should wear a facemask that covers your nose and mouth when you are in the same room with other people and when you visit a healthcare provider. People who live with or visit you should also wear a facemask while they are in the same room with you.  Separate yourself from other people in your home As much as possible, you should stay in a different room from other people in your home. Also, you should use a separate bathroom, if available.  Avoid sharing household items You should  not share dishes, drinking glasses, cups, eating utensils, towels, bedding, or other items with other people in your home. After using these items, you should wash them thoroughly with soap and water.  Cover your coughs and sneezes Cover your mouth and nose with a tissue when you cough or sneeze, or you can cough or sneeze into your sleeve. Throw used tissues in a lined trash can, and immediately wash your hands with soap and water for at least 20 seconds or use an alcohol-based hand rub.  Wash your [employer] your hands often and thoroughly with soap and water for at least 20 seconds. You can use an alcohol-based hand sanitizer if soap and water are not available and if your hands are not visibly dirty. Avoid touching your eyes, nose, and mouth with unwashed hands.   Prevention Steps for Caregivers and Household Members of Individuals Confirmed to have, or Being Evaluated for, COVID-19 Infection Being Cared for in the Home  If you live with, or provide care at home for, a person confirmed to have, or being evaluated for, COVID-19 infection please follow these guidelines to prevent infection:  Follow healthcare providers instructions Make sure that you understand and can help the patient follow any healthcare provider instructions for all care.  Provide for the patients basic needs You should help the patient with basic needs in the home and provide support for getting groceries, prescriptions, and other personal needs.  Monitor the patients symptoms If they are getting sicker, call his or her medical provider and tell them that the patient has, or is being evaluated for, COVID-19 infection. This will help the healthcare  providers office take steps to keep other people from getting infected. Ask the healthcare provider to call the local or state health department.  Limit the number of people who have contact with the patient  If possible, have only one caregiver for the  patient.  Other household members should stay in another home or place of residence. If this is not possible, they should stay  in another room, or be separated from the patient as much as possible. Use a separate bathroom, if available.  Restrict visitors who do not have an essential need to be in the home.  Keep older adults, very young children, and other sick people away from the patient Keep older adults, very young children, and those who have compromised immune systems or chronic health conditions away from the patient. This includes people with chronic heart, lung, or kidney conditions, diabetes, and cancer.  Ensure good ventilation Make sure that shared spaces in the home have good air flow, such as from an air conditioner or an opened window, weather permitting.  Wash your hands often  Wash your hands often and thoroughly with soap and water for at least 20 seconds. You can use an alcohol based hand sanitizer if soap and water are not available and if your hands are not visibly dirty.  Avoid touching your eyes, nose, and mouth with unwashed hands.  Use disposable paper towels to dry your hands. If not available, use dedicated cloth towels and replace them when they become wet.  Wear a facemask and gloves  Wear a disposable facemask at all times in the room and gloves when you touch or have contact with the patients blood, body fluids, and/or secretions or excretions, such as sweat, saliva, sputum, nasal mucus, vomit, urine, or feces.  Ensure the mask fits over your nose and mouth tightly, and do not touch it during use.  Throw out disposable facemasks and gloves after using them. Do not reuse.  Wash your hands immediately after removing your facemask and gloves.  If your personal clothing becomes contaminated, carefully remove clothing and launder. Wash your hands after handling contaminated clothing.  Place all used disposable facemasks, gloves, and other waste in a lined  container before disposing them with other household waste.  Remove gloves and wash your hands immediately after handling these items.  Do not share dishes, glasses, or other household items with the patient  Avoid sharing household items. You should not share dishes, drinking glasses, cups, eating utensils, towels, bedding, or other items with a patient who is confirmed to have, or being evaluated for, COVID-19 infection.  After the person uses these items, you should wash them thoroughly with soap and water.  Wash laundry thoroughly  Immediately remove and wash clothes or bedding that have blood, body fluids, and/or secretions or excretions, such as sweat, saliva, sputum, nasal mucus, vomit, urine, or feces, on them.  Wear gloves when handling laundry from the patient.  Read and follow directions on labels of laundry or clothing items and detergent. In general, wash and dry with the warmest temperatures recommended on the label.  Clean all areas the individual has used often  Clean all touchable surfaces, such as counters, tabletops, doorknobs, bathroom fixtures, toilets, phones, keyboards, tablets, and bedside tables, every day. Also, clean any surfaces that may have blood, body fluids, and/or secretions or excretions on them.  Wear gloves when cleaning surfaces the patient has come in contact with.  Use a diluted bleach solution (e.g., dilute bleach with  1 part bleach and 10 parts water) or a household disinfectant with a label that says EPA-registered for coronaviruses. To make a bleach solution at home, add 1 tablespoon of bleach to 1 quart (4 cups) of water. For a larger supply, add  cup of bleach to 1 gallon (16 cups) of water.  Read labels of cleaning products and follow recommendations provided on product labels. Labels contain instructions for safe and effective use of the cleaning product including precautions you should take when applying the product, such as wearing gloves or  eye protection and making sure you have good ventilation during use of the product.  Remove gloves and wash hands immediately after cleaning.  Monitor yourself for signs and symptoms of illness Caregivers and household members are considered close contacts, should monitor their health, and will be asked to limit movement outside of the home to the extent possible. Follow the monitoring steps for close contacts listed on the symptom monitoring form.   ? If you have additional questions, contact your local health department or call the epidemiologist on call at [phone] (available 24/7). ? This guidance is subject to change. For the most up-to-date guidance from [facility], please refer to their website: [email or web address]

## 2020-08-11 NOTE — Discharge Summary (Signed)
Cynthia Roach, is a 49 y.o. female  DOB 19-Jul-1971  MRN [id number].  Admission date:  08/07/2020  Admitting Physician  Eduard Clos, MD  Discharge Date:  08/11/2020   Primary MD  Nelwyn Salisbury, MD  Recommendations for primary care physician for things to follow:  -Please check CBC, CMP during next visit.   Admission Diagnosis  Pneumonia due to COVID-19 virus [U07.1, J12.82]   Discharge Diagnosis  Pneumonia due to COVID-19 virus [U07.1, J12.82]    Principal Problem:   Pneumonia due to COVID-19 virus Active Problems:   Essential hypertension   Trigeminal neuralgia   CKD (chronic kidney disease), stage II      Past Medical History:  Diagnosis Date  . Chronic kidney disease    left ureteral stone  . Hypertension   . Ovarian cyst   . Seasonal allergies   . Trigeminal neuralgia     Past Surgical History:  Procedure Laterality Date  . CESAREAN SECTION         History of present illness and  Hospital Course:     Kindly see H&P for history of present illness and admission details, please review complete Labs, Consult reports and Test reports for all details in brief  HPI  from the history and physical done on the day of admission 08/07/2020  HPI: Cynthia Roach is a 49 y.o. female with history of hypertension, trigeminal neuralgia presents to the ER with complaint of shortness of breath subjective feeling of fever chills and cough.  Has been having poor appetite but no nausea vomiting or diarrhea.  Has not taken COVID-19 vaccines.  ED Course: In the ER patient was febrile with temperature of 103 F tachycardic hypoxic requiring 2 L oxygen with chest x-ray showing bilateral infiltrates Covid test was positive.  Labs are significant for CRP of 16.7 creatinine 1.04 potassium 3.4 hemoglobin 11.7 with EKG showing sinus tachycardia.  Patient was started on remdesivir and  steroids.  Admitted for further management of Covid infection  Hospital Course   Acute hypoxemic respiratory failure due to covid-19 pneumonia: SARS-CoV-2 PCR positive on 12/13.  -  Initially with oxygen requirement 2 L nasal cannula, this appears to be resolved, she is with no oxygen requirement over last 72  Hours even with activity. -Procalcitonin 0.28,  so she was treated with Rocephin and azithromycin. -Treated with 5 days of remdesivir -With IV steroids during hospital stay, she will be discharged on oral Decadron for another 5 days to finish total of 10 days of treatment -Leukocytosis in the setting of steroids.   AST elevation: Mild, possibly viral infection is etiology.   HTN:  -Resume home medications  Trigeminal neuralgia:  - Continue gabapentin  Anemia: Normocytic, unclear etiology though no bleeding.    Hypokalemia:  - Improved.   Discharge Condition:  stable      Discharge Instructions  and  Discharge Medications   Discharge Instructions    Diet - low sodium heart healthy   Complete by: As  directed    Discharge instructions   Complete by: As directed    Follow with Primary MD Nelwyn Salisbury, MD in 14 days   Get CBC, CMP, checked  by Primary MD next visit.    Activity: As tolerated with Full fall precautions use walker/cane & assistance as needed   Disposition Home    Diet: Heart Healthy .   On your next visit with your primary care physician please Get Medicines reviewed and adjusted.   Please request your Prim.MD to go over all Hospital Tests and Procedure/Radiological results at the follow up, please get all Hospital records sent to your Prim MD by signing hospital release before you go home.   If you experience worsening of your admission symptoms, develop shortness of breath, life threatening emergency, suicidal or homicidal thoughts you must seek medical attention immediately by calling 911 or calling your MD immediately  if symptoms  less severe.  You Must read complete instructions/literature along with all the possible adverse reactions/side effects for all the Medicines you take and that have been prescribed to you. Take any new Medicines after you have completely understood and accpet all the possible adverse reactions/side effects.   Do not drive, operating heavy machinery, perform activities at heights, swimming or participation in water activities or provide baby sitting services if your were admitted for syncope or siezures until you have seen by Primary MD or a Neurologist and advised to do so again.  Do not drive when taking Pain medications.    Do not take more than prescribed Pain, Sleep and Anxiety Medications  Special Instructions: If you have smoked or chewed Tobacco  in the last 2 yrs please stop smoking, stop any regular Alcohol  and or any Recreational drug use.  Wear Seat belts while driving.   Please note  You were cared for by a hospitalist during your hospital stay. If you have any questions about your discharge medications or the care you received while you were in the hospital after you are discharged, you can call the unit and asked to speak with the hospitalist on call if the hospitalist that took care of you is not available. Once you are discharged, your primary care physician will handle any further medical issues. Please note that NO REFILLS for any discharge medications will be authorized once you are discharged, as it is imperative that you return to your primary care physician (or establish a relationship with a primary care physician if you do not have one) for your aftercare needs so that they can reassess your need for medications and monitor your lab values.   Increase activity slowly   Complete by: As directed      Allergies as of 08/11/2020      Reactions   Oxycodone Hives, Anaphylaxis   Had hives and shortness of breath Hives Other reaction(s): Hives Had hives and shortness of  breath   Shellfish Allergy Anaphylaxis, Hives   Amoxicillin Other (See Comments)   Burns when urinating Burns when urinating   Lisinopril Cough   Morphine And Related    SOB, rash & hives      Medication List    STOP taking these medications   acetaminophen 500 MG tablet Commonly known as: TYLENOL   cephALEXin 500 MG capsule Commonly known as: KEFLEX     TAKE these medications   AMBULATORY NON FORMULARY MEDICATION Rolling Knee Scooter, please take Rx to medical supply store.   amLODipine 10 MG tablet Commonly known  as: NORVASC Take 1 tablet (10 mg total) by mouth daily.   dexamethasone 6 MG tablet Commonly known as: DECADRON Take 1 tablet (6 mg total) by mouth daily. Start taking on: August 12, 2020   EPINEPHrine 0.3 mg/0.3 mL Soaj injection Commonly known as: EpiPen 2-Pak Inject 0.3 mLs (0.3 mg total) into the muscle once. What changed:   when to take this  reasons to take this   gabapentin 300 MG capsule Commonly known as: NEURONTIN Take 2 capsules (600 mg total) by mouth 3 (three) times daily.   latanoprost 0.005 % ophthalmic solution Commonly known as: XALATAN Place 1 drop into both eyes at bedtime.   losartan-hydrochlorothiazide 100-25 MG tablet Commonly known as: HYZAAR Take 1 tablet by mouth daily.   pantoprazole 40 MG tablet Commonly known as: Protonix Take 1 tablet (40 mg total) by mouth daily for 10 days.   tretinoin 0.05 % cream Commonly known as: RETIN-A Apply 1 application topically at bedtime.         Diet and Activity recommendation: See Discharge Instructions above   Consults obtained -  none   Major procedures and Radiology Reports - PLEASE review detailed and final reports for all details, in brief -      DG Chest Portable 1 View  Result Date: 08/07/2020 CLINICAL DATA:  Cough and shortness of breath EXAM: PORTABLE CHEST 1 VIEW COMPARISON:  July 19, 2016 FINDINGS: There is airspace opacity in portions of the left  mid and lower lung regions as well as in the right perihilar region. Heart size and pulmonary vascularity are normal. No adenopathy. No bone lesions. IMPRESSION: Multifocal pneumonia, more severe on the left than on the right. Advise check of COVID-19 status given this appearance, although bacterial pneumonia could present in this manner. Heart size normal. No adenopathy. Electronically Signed   By: Bretta Bang III M.D.   On: 08/07/2020 14:27    Micro Results     Recent Results (from the past 240 hour(s))  Resp Panel by RT-PCR (Flu A&B, Covid) Nasopharyngeal Swab     Status: Abnormal   Collection Time: 08/07/20  2:45 PM   Specimen: Nasopharyngeal Swab; Nasopharyngeal(NP) swabs in vial transport medium  Result Value Ref Range Status   SARS Coronavirus 2 by RT PCR POSITIVE (A) NEGATIVE Final    Comment: RESULT CALLED TO, READ BACK BY AND VERIFIED WITH: SIMMS MARVA, RN  ON 08/07/2020, [email or web address] (NOTE) SARS-CoV-2 target nucleic acids are DETECTED.  The SARS-CoV-2 RNA is generally detectable in upper respiratory specimens during the acute phase of infection. Positive results are indicative of the presence of the identified virus, but do not rule out bacterial infection or co-infection with other pathogens not detected by the test. Clinical correlation with patient history and other diagnostic information is necessary to determine patient infection status. The expected result is Negative.  Fact Sheet for Patients: BloggerCourse.com  Fact Sheet for Healthcare Providers: SeriousBroker.it  This test is not yet approved or cleared by the [place] FDA and  has been authorized for detection and/or diagnosis of SARS-CoV-2 by FDA under an Emergency Use Authorization (EUA).  This EUA will remain in effect (meaning t his test can be used) for the duration of  the COVID-19 declaration under Section 564(b)(1) of the Act, 21 U.S.C.  section 360bbb-3(b)(1), unless the authorization is terminated or revoked sooner.     Influenza A by PCR NEGATIVE NEGATIVE Final   Influenza B by PCR NEGATIVE NEGATIVE Final    Comment: (NOTE)  The Xpert Xpress SARS-CoV-2/FLU/RSV plus assay is intended as an aid in the diagnosis of influenza from Nasopharyngeal swab specimens and should not be used as a sole basis for treatment. Nasal washings and aspirates are unacceptable for Xpert Xpress SARS-CoV-2/FLU/RSV testing.  Fact Sheet for Patients: BloggerCourse.comhttps://www.fda.gov/media/152166/download  Fact Sheet for Healthcare Providers: SeriousBroker.ithttps://www.fda.gov/media/152162/download  This test is not yet approved or cleared by the [place] FDA and has been authorized for detection and/or diagnosis of SARS-CoV-2 by FDA under an Emergency Use Authorization (EUA). This EUA will remain in effect (meaning this test can be used) for the duration of the COVID-19 declaration under Section 564(b)(1) of the Act, 21 U.S.C. section 360bbb-3(b)(1), unless the authorization is terminated or revoked.  Performed at [facility] [place], [address] [place], [place] [address]   Blood Culture (routine x 2)     Status: None (Preliminary result)   Collection Time: 08/07/20  4:38 PM   Specimen: BLOOD  Result Value Ref Range Status   Specimen Description   Final    BLOOD BLOOD RIGHT FOREARM Performed at [facility] [place], [address] [place], [place] [address]    Special Requests   Final    BOTTLES DRAWN AEROBIC AND ANAEROBIC Blood Culture results may not be optimal due to an inadequate volume of blood received in culture bottles Performed at [facility] [place], [address] [place], [place] [address]    Culture   Final    NO GROWTH 4 DAYS Performed at [facility], [address] [place], [place] [address]    Report Status PENDING  Incomplete  Blood Culture (routine x 2)     Status: None (Preliminary result)   Collection  Time: 08/07/20  4:40 PM   Specimen: BLOOD  Result Value Ref Range Status   Specimen Description   Final    BLOOD BLOOD LEFT HAND Performed at [facility] [place], [address] [place], [place] [address]    Special Requests   Final    BOTTLES DRAWN AEROBIC AND ANAEROBIC Blood Culture adequate volume Performed at [facility] [place], [address] [place], [place] [address]    Culture   Final    NO GROWTH 4 DAYS Performed at [facility], [address] [place], [place] [address]    Report Status PENDING  Incomplete       Today   Subjective:   Cynthia Roach today has no headache,no chest abdominal pain,no new weakness tingling or numbness, feels much better wants to go home today.   Objective:   Blood pressure 140/88, pulse 66, temperature 98.3 F (36.8 C), temperature source Oral, resp. rate 15, height 5\' 5"  (1.651 m), weight 68.5 kg, last menstrual period 08/07/2020, SpO2 92 %.   Intake/Output Summary (Last 24 hours) at 08/11/2020 1037 Last data filed at 08/10/2020 2108 Gross per 24 hour  Intake 948 ml  Output --  Net 948 ml    Exam Awake Alert, Oriented x 3, No new F.N deficits, Normal affect Symmetrical Chest wall movement, Good air movement bilaterally, CTAB RRR,No Gallops,Rubs or new Murmurs, No Parasternal Heave +ve B.Sounds. No Cyanosis, Clubbing or edema, No new Rash or bruise  Data Review   CBC w Diff:  Lab Results  Component Value Date   WBC 17.6 (H) 08/11/2020   HGB 10.4 (L) 08/11/2020   HCT 31.7 (L) 08/11/2020   PLT 728 (H) 08/11/2020   LYMPHOPCT 5  08/11/2020   MONOPCT 5 08/11/2020   EOSPCT 0 08/11/2020   BASOPCT 0 08/11/2020    CMP:  Lab Results  Component Value Date   NA 140 08/11/2020   K 4.0 08/11/2020   CL 105 08/11/2020   CO2 24 08/11/2020   BUN 23 (H) 08/11/2020   CREATININE 0.84 08/11/2020   PROT 6.6 08/11/2020   ALBUMIN 2.6 (L) 08/11/2020   BILITOT 0.5 08/11/2020   ALKPHOS 63 08/11/2020   AST 40  08/11/2020   ALT 94 (H) 08/11/2020  .   Total Time in preparing paper work, data evaluation and todays exam - 35 minutes  Huey Bienenstock M.D on 08/11/2020 at 10:37 AM  [facility]   Office  [phone]

## 2020-08-11 NOTE — Progress Notes (Signed)
Patient will be discharging home with family. Belongings were returned to her. Medication education was provided.

## 2020-08-11 NOTE — Telephone Encounter (Signed)
Transition Care Management Unsuccessful Follow-up Telephone Call  Date of discharge and from where:  08/11/2020  from [facility]   Attempts:  1st Attempt  Reason for unsuccessful TCM follow-up call:  Left voice message

## 2020-08-12 LAB — CULTURE, BLOOD (ROUTINE X 2)
Culture: NO GROWTH
Culture: NO GROWTH
Special Requests: ADEQUATE

## 2020-08-14 NOTE — Telephone Encounter (Signed)
Transition Care Management Unsuccessful Follow-up Telephone Call  Date of discharge and from where:  08/11/2020 from [facility]   Attempts:  2nd Attempt  Reason for unsuccessful TCM follow-up call:  Left voice message

## 2020-08-22 ENCOUNTER — Ambulatory Visit (INDEPENDENT_AMBULATORY_CARE_PROVIDER_SITE_OTHER): Payer: 59 | Admitting: Family Medicine

## 2020-08-22 ENCOUNTER — Encounter: Payer: Self-pay | Admitting: Family Medicine

## 2020-08-22 ENCOUNTER — Other Ambulatory Visit: Payer: Self-pay

## 2020-08-22 VITALS — BP 144/84 | HR 99 | Ht 65.0 in | Wt 165.0 lb

## 2020-08-22 DIAGNOSIS — G500 Trigeminal neuralgia: Secondary | ICD-10-CM

## 2020-08-22 DIAGNOSIS — U071 COVID-19: Secondary | ICD-10-CM | POA: Diagnosis not present

## 2020-08-22 MED ORDER — CARBAMAZEPINE 100 MG PO CHEW: CHEWABLE_TABLET | ORAL | 1 refills | Status: DC

## 2020-08-22 NOTE — Patient Instructions (Signed)
Trigeminal Neuralgia  Trigeminal neuralgia is a nerve disorder that causes severe pain on one side of the face. The pain may last from a few seconds to several minutes. The pain is usually only on one side of the face. Symptoms may occur for days, weeks, or months and then go away for months or years. The pain may return and be worse than before. What are the causes? This condition is caused by damage or pressure to a nerve in the head that is called the trigeminal nerve. An attack can be triggered by:  Talking.  Chewing.  Putting on makeup.  Washing your face.  Shaving your face.  Brushing your teeth.  Touching your face. What increases the risk? You are more likely to develop this condition if you:  Are 50 years of age or older.  Are female. What are the signs or symptoms? The main symptom of this condition is severe pain in the:  Jaw.  Lips.  Eyes.  Nose.  Scalp.  Forehead.  Face. The pain may be:  Intense.  Stabbing.  Electric.  Shock-like. How is this diagnosed? This condition is diagnosed with a physical exam. A CT scan or an MRI may be done to rule out other conditions that can cause facial pain. How is this treated? This condition may be treated with:  Avoiding the things that trigger your symptoms.  Taking prescription medicines (anticonvulsants).  Having surgery. This may be done in severe cases if other medical treatment does not provide relief.  Having procedures such as ablation, thermal, or radiation therapy. It may take up to one month for treatment to start relieving the pain. Follow these instructions at home: Managing pain  Learn as much as you can about how to manage your pain. Ask your health care provider if a pain specialist would be helpful.  Consider talking with a mental health care provider (psychologist) about how to cope with the pain.  Consider joining a pain support group. General instructions  Take  over-the-counter and prescription medicines only as told by your health care provider.  Avoid the things that trigger your symptoms. It may help to: ? Chew on the unaffected side of your mouth. ? Avoid touching your face. ? Avoid blasts of hot or cold air.  Follow your treatment plan as told by your health care provider. This may include: ? Cognitive or behavioral therapy. ? Gentle, regular exercise. ? Meditation or yoga. ? Aromatherapy.  Keep all follow-up visits as told by your health care provider. You may need to be monitored closely to make sure treatment is working well for you. Where to find more information  Facial Pain Association: fpa-support.org Contact a health care provider if:  Your medicine is not helping your symptoms.  You have side effects from the medicine used for treatment.  You develop new, unexplained symptoms, such as: ? Double vision. ? Facial weakness. ? Facial numbness. ? Changes in hearing or balance.  You feel depressed. Get help right away if:  Your pain is severe and is not getting better.  You develop suicidal thoughts. If you ever feel like you may hurt yourself or others, or have thoughts about taking your own life, get help right away. You can go to your nearest emergency department or call:  Your local emergency services (911 in the [place]  A suicide crisis helpline, such as the National Suicide Prevention Lifeline at 1-800-273-8255. This is open 24 hours a day. Summary  Trigeminal neuralgia is a   nerve disorder that causes severe pain on one side of the face. The pain may last from a few seconds to several minutes.  This condition is caused by damage or pressure to a nerve in the head that is called the trigeminal nerve.  Treatment may include avoiding the things that trigger your symptoms, taking medicines, or having surgery or procedures. It may take up to one month for treatment to start relieving the pain.  Avoid the things that  trigger your symptoms.  Keep all follow-up visits as told by your health care provider. You may need to be monitored closely to make sure treatment is working well for you. This information is not intended to replace advice given to you by your health care provider. Make sure you discuss any questions you have with your health care provider. Document Revised: 06/29/2018 Document Reviewed: 06/29/2018 Elsevier Patient Education  2020 [employer]  

## 2020-08-22 NOTE — Progress Notes (Signed)
Established Patient Office Visit  Subjective:  Patient ID: Cynthia Roach, female    DOB: 1970-11-30  Age: 49 y.o. MRN: [id number]  CC:  Chief Complaint  Patient presents with   Peripheral Neuropathy    HPI Cynthia Roach presents for recent flare with trigeminal neuralgia.  She has had this diagnosis for 13 years.  She has been tried on multiple things previously but apparently has never been on Tegretol.  She is currently taking gabapentin 600 mg 3 times daily without relief.  Her flareups have always involved the right side of face and involve the mandibular distribution of the trigeminal nerve.  Triggered with things like kissing, drinking, eating, touching the skin on that area.  She has seen neurologist in the past.  She is also taking high-dose ibuprofen without relief.  Her pain is very intense at times.  She has very sharp lancinating pain.  No facial weakness.  She has also tried things like steroids in the past without relief.  She had recent Covid infection and hospitalization.  Recent labs reviewed.  White blood count was elevated during that admission.  She had mildly elevated ALT levels.  She is feeling better at this time.  Still has some mild cough but no dyspnea.  No fever.  Past Medical History:  Diagnosis Date   Chronic kidney disease    left ureteral stone   Hypertension    Ovarian cyst    Seasonal allergies    Trigeminal neuralgia     Past Surgical History:  Procedure Laterality Date   CESAREAN SECTION      Family History  Problem Relation Age of Onset   Alcohol abuse Other    Arthritis Other    Breast cancer Other    Colon cancer Other    Diabetes Other    Hyperlipidemia Other    Hypertension Other    Prostate cancer Other    Depression Other    Stroke Other    Sudden death Other    Coronary artery disease Other     Social History   Socioeconomic History   Marital status: Married    Spouse name: Not on file    Number of children: Not on file   Years of education: Not on file   Highest education level: Not on file  Occupational History   Not on file  Tobacco Use   Smoking status: Never Smoker   Smokeless tobacco: Never Used  Substance and Sexual Activity   Alcohol use: No    Alcohol/week: 0.0 standard drinks   Drug use: No   Sexual activity: Not on file  Other Topics Concern   Not on file  Social History Narrative   Not on file   Social Determinants of Health   Financial Resource Strain: Not on file  Food Insecurity: Not on file  Transportation Needs: Not on file  Physical Activity: Not on file  Stress: Not on file  Social Connections: Not on file  Intimate Partner Violence: Not on file    Outpatient Medications Prior to Visit  Medication Sig Dispense Refill   AMBULATORY NON FORMULARY MEDICATION Rolling Knee Scooter, please take Rx to medical supply store. 1 each 0   amLODipine (NORVASC) 10 MG tablet Take 1 tablet (10 mg total) by mouth daily. 90 tablet 3   dexamethasone (DECADRON) 6 MG tablet Take 1 tablet (6 mg total) by mouth daily. 5 tablet 0   EPINEPHrine (EPIPEN 2-PAK) 0.3 mg/0.3 mL IJ  SOAJ injection Inject 0.3 mLs (0.3 mg total) into the muscle once. (Patient taking differently: Inject 0.3 mg into the muscle daily as needed for anaphylaxis.) 1 Device 5   gabapentin (NEURONTIN) 300 MG capsule Take 2 capsules (600 mg total) by mouth 3 (three) times daily. 180 capsule 3   latanoprost (XALATAN) 0.005 % ophthalmic solution Place 1 drop into both eyes at bedtime.     losartan-hydrochlorothiazide (HYZAAR) 100-25 MG tablet Take 1 tablet by mouth daily. 90 tablet 3   tretinoin (RETIN-A) 0.05 % cream Apply 1 application topically at bedtime.     pantoprazole (PROTONIX) 40 MG tablet Take 1 tablet (40 mg total) by mouth daily for 10 days. 10 tablet 0   No facility-administered medications prior to visit.    Allergies  Allergen Reactions   Oxycodone Hives and  Anaphylaxis    Had hives and shortness of breath Hives Other reaction(s): Hives Had hives and shortness of breath   Shellfish Allergy Anaphylaxis and Hives   Amoxicillin Other (See Comments)    Burns when urinating Burns when urinating   Lisinopril Cough   Morphine And Related     SOB, rash & hives    ROS Review of Systems  Constitutional: Negative for chills and fever.  Respiratory: Positive for cough. Negative for shortness of breath.   Cardiovascular: Negative for chest pain.  Skin: Negative for rash.  Neurological: Negative for dizziness, weakness and headaches.      Objective:    Physical Exam Vitals reviewed.  Constitutional:      Appearance: Normal appearance.  HENT:     Head: Normocephalic and atraumatic.     Right Ear: Tympanic membrane normal.     Left Ear: Tympanic membrane normal.  Eyes:     Pupils: Pupils are equal, round, and reactive to light.  Cardiovascular:     Rate and Rhythm: Normal rate and regular rhythm.  Pulmonary:     Effort: Pulmonary effort is normal.     Breath sounds: Normal breath sounds.  Musculoskeletal:     Cervical back: Neck supple.  Lymphadenopathy:     Cervical: No cervical adenopathy.  Neurological:     General: No focal deficit present.     Mental Status: She is alert.     Cranial Nerves: No cranial nerve deficit.     BP (!) 144/84    Pulse 99    Ht 5\' 5"  (1.651 m)    Wt 165 lb (74.8 kg)    LMP 08/07/2020    SpO2 99%    BMI 27.46 kg/m  Wt Readings from Last 3 Encounters:  08/22/20 165 lb (74.8 kg)  08/07/20 151 lb (68.5 kg)  07/04/20 168 lb 9.6 oz (76.5 kg)     Health Maintenance Due  Topic Date Due   Hepatitis C Screening  Never done   PNEUMOCOCCAL POLYSACCHARIDE VACCINE AGE 38-64 HIGH RISK  Never done   FOOT EXAM  Never done   OPHTHALMOLOGY EXAM  Never done   TETANUS/TDAP  Never done   PAP SMEAR-Modifier  Never done   COLONOSCOPY (Pts 45-2yrs Insurance coverage will need to be confirmed)  Never  done   HEMOGLOBIN A1C  02/26/2019   INFLUENZA VACCINE  03/26/2020    There are no preventive care reminders to display for this patient.  Lab Results  Component Value Date   TSH 0.44 08/28/2018   Lab Results  Component Value Date   WBC 17.6 (H) 08/11/2020   HGB 10.4 (L) 08/11/2020  HCT 31.7 (L) 08/11/2020   MCV 93.5 08/11/2020   PLT 728 (H) 08/11/2020   Lab Results  Component Value Date   NA 140 08/11/2020   K 4.0 08/11/2020   CO2 24 08/11/2020   GLUCOSE 143 (H) 08/11/2020   BUN 23 (H) 08/11/2020   CREATININE 0.84 08/11/2020   BILITOT 0.5 08/11/2020   ALKPHOS 63 08/11/2020   AST 40 08/11/2020   ALT 94 (H) 08/11/2020   PROT 6.6 08/11/2020   ALBUMIN 2.6 (L) 08/11/2020   CALCIUM 8.6 (L) 08/11/2020   ANIONGAP 11 08/11/2020   GFR 83.42 08/28/2018   Lab Results  Component Value Date   CHOL 196 02/10/2009   Lab Results  Component Value Date   HDL 44.80 02/10/2009   Lab Results  Component Value Date   LDLCALC 130 (H) 02/10/2009   Lab Results  Component Value Date   TRIG 133 08/07/2020   Lab Results  Component Value Date   CHOLHDL 4 02/10/2009   Lab Results  Component Value Date   HGBA1C 6.0 08/28/2018      Assessment & Plan:   #1 trigeminal neuralgia involving right mandibular branch of trigeminal nerve.  She is currently on gabapentin high-dose without relief.  Still having frequent episodes of severe pain even on high-dose gabapentin.  -We discussed trial of Tegretol starting 100 mg twice daily and titrate after 1 to 2 days if tolerated to 200 mg twice daily.  She may need further slow titration depending on response.  We reviewed potential side effects.  -She has follow-up already scheduled with primary January 7 and recommend consider repeat CBC and hepatic panel at that time  #2 recent Covid infection clinically improving  Meds ordered this encounter  Medications   carbamazepine (TEGRETOL) 100 MG chewable tablet    Sig: Take 100 mg by mouth  bid for 1-2 days and then increase to two tablets twice daily    Dispense:  120 tablet    Refill:  1    Follow-up: Return today (on 08/22/2020).    Evelena Peat, MD

## 2020-08-31 ENCOUNTER — Other Ambulatory Visit: Payer: Self-pay

## 2020-09-01 ENCOUNTER — Encounter: Payer: Self-pay | Admitting: Family Medicine

## 2020-09-01 ENCOUNTER — Ambulatory Visit (INDEPENDENT_AMBULATORY_CARE_PROVIDER_SITE_OTHER): Payer: 59 | Admitting: Family Medicine

## 2020-09-01 VITALS — BP 160/100 | HR 84 | Temp 98.9°F | Wt 169.6 lb

## 2020-09-01 DIAGNOSIS — J1282 Pneumonia due to coronavirus disease 2019: Secondary | ICD-10-CM

## 2020-09-01 DIAGNOSIS — G4700 Insomnia, unspecified: Secondary | ICD-10-CM

## 2020-09-01 DIAGNOSIS — R739 Hyperglycemia, unspecified: Secondary | ICD-10-CM | POA: Diagnosis not present

## 2020-09-01 DIAGNOSIS — N182 Chronic kidney disease, stage 2 (mild): Secondary | ICD-10-CM

## 2020-09-01 DIAGNOSIS — U071 COVID-19: Secondary | ICD-10-CM

## 2020-09-01 DIAGNOSIS — G500 Trigeminal neuralgia: Secondary | ICD-10-CM | POA: Diagnosis not present

## 2020-09-01 LAB — BASIC METABOLIC PANEL
BUN: 10 mg/dL (ref 6–23)
CO2: 28 mEq/L (ref 19–32)
Calcium: 8.9 mg/dL (ref 8.4–10.5)
Chloride: 105 mEq/L (ref 96–112)
Creatinine, Ser: 0.99 mg/dL (ref 0.40–1.20)
GFR: 67.59 mL/min (ref >60.00)
Glucose, Bld: 96 mg/dL (ref 70–99)
Potassium: 4.3 mEq/L (ref 3.5–5.1)
Sodium: 139 mEq/L (ref 135–145)

## 2020-09-01 LAB — CBC WITH DIFFERENTIAL/PLATELET
Basophils Absolute: 0 10*3/uL (ref 0.0–0.1)
Basophils Relative: 0.9 % (ref 0.0–3.0)
Eosinophils Absolute: 0.1 10*3/uL (ref 0.0–0.7)
Eosinophils Relative: 2.3 % (ref 0.0–5.0)
HCT: 31.8 % — ABNORMAL LOW (ref 36.0–46.0)
Hemoglobin: 10.7 g/dL — ABNORMAL LOW (ref 12.0–15.0)
Lymphocytes Relative: 21.8 % (ref 12.0–46.0)
Lymphs Abs: 1.1 10*3/uL (ref 0.7–4.0)
MCHC: 33.7 g/dL (ref 30.0–36.0)
MCV: 93.5 fl (ref 78.0–100.0)
Monocytes Absolute: 0.7 10*3/uL (ref 0.1–1.0)
Monocytes Relative: 15.1 % — ABNORMAL HIGH (ref 3.0–12.0)
Neutro Abs: 2.9 10*3/uL (ref 1.4–7.7)
Neutrophils Relative %: 59.9 % (ref 43.0–77.0)
Platelets: 272 10*3/uL (ref 150.0–400.0)
RBC: 3.4 Mil/uL — ABNORMAL LOW (ref 3.87–5.11)
RDW: 16 % — ABNORMAL HIGH (ref 11.5–15.5)
WBC: 4.9 10*3/uL (ref 4.0–10.5)

## 2020-09-01 LAB — MAGNESIUM: Magnesium: 1.8 mg/dL (ref 1.5–2.5)

## 2020-09-01 LAB — HEMOGLOBIN A1C: Hgb A1c MFr Bld: 6 % (ref 4.6–6.5)

## 2020-09-01 MED ORDER — TEMAZEPAM 30 MG PO CAPS: 30.0000 mg | ORAL_CAPSULE | Freq: Every evening | ORAL | 2 refills | Status: DC | PRN

## 2020-09-01 NOTE — Progress Notes (Signed)
   Subjective:    Patient ID: Cynthia Roach, female    DOB: 12-Aug-1971, 50 y.o.   MRN: [id number]  HPI Here to follow up on a hospital stay from 08-07-20 to 08-11-20 for a Covid-19 pneumonia. She was treated with Rocephin, Azithromycin, Remdesivir, Decadron , and oxygen. Since getting home she has continued to be very fatigued and she has a dry cough that comes and goes. No SOB or chest pain. Her appetite is good, though she gets nauseated at times. Drinking fluids. She is very constipated, and she averages a BM every 3 days. No fever. Her memory is poor and she finds it hard to concentrate. Her sleep has been very poor despite despite taking melatonin. Her trigeminal neuralgia has also been worse, and she saw Dr. Caryl Never about this 2 weeks ago. He stopped the Gabapentin and started her on Carbamazepine. She has titrated up to taking 200 mg BID. This has helped the pain somewhat, but this is still a problem for her.   Review of Systems  Constitutional: Positive for fatigue.  Respiratory: Positive for cough. Negative for wheezing.   Cardiovascular: Negative.   Gastrointestinal: Positive for constipation. Negative for abdominal pain.  Endocrine: Positive for cold intolerance.  Genitourinary: Negative.   Neurological: Positive for weakness.  Psychiatric/Behavioral: Positive for confusion, decreased concentration and sleep disturbance. Negative for agitation.       Objective:   Physical Exam Constitutional:      General: She is not in acute distress. Cardiovascular:     Rate and Rhythm: Normal rate and regular rhythm.     Pulses: Normal pulses.     Heart sounds: Normal heart sounds.  Pulmonary:     Effort: Pulmonary effort is normal.     Breath sounds: Normal breath sounds.  Abdominal:     General: Abdomen is flat. Bowel sounds are normal. There is no distension.     Palpations: Abdomen is soft. There is no mass.     Tenderness: There is no abdominal tenderness. There is no  guarding or rebound.     Hernia: No hernia is present.  Skin:    Findings: No erythema or rash.  Neurological:     General: No focal deficit present.     Mental Status: She is alert and oriented to person, place, and time.           Assessment & Plan:  She is recovering from a Covid pneumonia, and she has a long ways to go yet. I reassured her that the fatigue and the "brain fog" will also improve over time. We will check labs today for renal function, CBC, etc. She ia also taking Carbamazepine for the trigeminal neuralgia, and I would like to increase the dose if possible. We will check a blood level today to see where we are now. For sleep, she will try Temazepam 30 mg qhs.  Gershon Crane, MD

## 2020-09-02 LAB — CARBAMAZEPINE LEVEL, TOTAL: Carbamazepine Lvl: 10.1 mg/L (ref 4.0–12.0)

## 2020-09-02 LAB — RETICULOCYTES
ABS Retic: 88920 cells/uL — ABNORMAL HIGH (ref 20000–8000)
Retic Ct Pct: 2.6 %

## 2020-09-09 ENCOUNTER — Encounter: Payer: Self-pay | Admitting: Family Medicine

## 2020-09-12 MED ORDER — CARBAMAZEPINE ER 300 MG PO CP12: 300.0000 mg | ORAL_CAPSULE | Freq: Two times a day (BID) | ORAL | 2 refills | Status: DC

## 2020-09-12 NOTE — Telephone Encounter (Signed)
I increased the Carbamazepine to 300 mg BID, and I sent in a 3 month supply. At the end of 3 months we will need to check a blood level for this

## 2020-09-26 ENCOUNTER — Other Ambulatory Visit: Payer: Self-pay

## 2020-09-27 ENCOUNTER — Encounter: Payer: Self-pay | Admitting: Family Medicine

## 2020-09-27 ENCOUNTER — Ambulatory Visit: Payer: 59 | Admitting: Family Medicine

## 2020-09-27 VITALS — BP 148/86 | HR 84 | Temp 98.5°F | Resp 18 | Wt 173.6 lb

## 2020-09-27 DIAGNOSIS — G500 Trigeminal neuralgia: Secondary | ICD-10-CM

## 2020-09-27 DIAGNOSIS — R0602 Shortness of breath: Secondary | ICD-10-CM | POA: Diagnosis not present

## 2020-09-27 DIAGNOSIS — R2990 Unspecified symptoms and signs involving the nervous system: Secondary | ICD-10-CM | POA: Diagnosis not present

## 2020-09-27 DIAGNOSIS — U099 Post covid-19 condition, unspecified: Secondary | ICD-10-CM

## 2020-09-27 DIAGNOSIS — R519 Headache, unspecified: Secondary | ICD-10-CM

## 2020-09-27 DIAGNOSIS — R413 Other amnesia: Secondary | ICD-10-CM | POA: Diagnosis not present

## 2020-09-27 DIAGNOSIS — G8929 Other chronic pain: Secondary | ICD-10-CM

## 2020-09-27 MED ORDER — TRAMADOL HCL 50 MG PO TABS: 100.0000 mg | ORAL_TABLET | Freq: Four times a day (QID) | ORAL | 2 refills | Status: DC | PRN

## 2020-09-27 MED ORDER — ZOLPIDEM TARTRATE 10 MG PO TABS: 10.0000 mg | ORAL_TABLET | Freq: Every evening | ORAL | 2 refills | Status: DC | PRN

## 2020-09-27 NOTE — Progress Notes (Signed)
   Subjective:    Patient ID: Cynthia Roach, female    DOB: 1971/05/15, 50 y.o.   MRN: [id number]  HPI Here with her husband to follow up on a Covid-19 infection. She was hospitalized in mid December with Covid pneumonia, and she was able to go home after several days. However she has continued to struggle with lingering effects of the virus. She brings with her today a list of 26 symptoms that she has every day including fatigue, SOB, memory loss, difficulty thinking and concentrating, headaches, insomnia, shaking, anxiety, etc. Her husband tells me he sees evidence of all these in the way she moves, breathes, and carries on conversations. She remains on Gabapentin and Carbamazepine for trigeminal neuralgia, and her Carbamazepine level from 3 weeks ago was in the target range. She has tried Temazepam for sleep, but this has not helped. She says she averages one hour of sleep a night. Her appetite is fairly good. She is getting very frustrated because she cannot take care of family responsibilities like she used to. She cannot drive yet, and this puts a burden on the rest of her family.    Review of Systems  Constitutional: Positive for fatigue.  Respiratory: Positive for shortness of breath. Negative for cough and wheezing.   Cardiovascular: Negative.   Gastrointestinal: Negative.   Genitourinary: Negative.   Psychiatric/Behavioral: Positive for decreased concentration, dysphoric mood and sleep disturbance. Negative for agitation, behavioral problems and confusion. The patient is nervous/anxious.        Objective:   Physical Exam Constitutional:      General: She is not in acute distress.    Appearance: Normal appearance.  Cardiovascular:     Rate and Rhythm: Normal rate and regular rhythm.     Pulses: Normal pulses.     Heart sounds: Normal heart sounds.  Pulmonary:     Effort: Pulmonary effort is normal. No respiratory distress.     Breath sounds: Normal breath sounds. No  stridor. No wheezing, rhonchi or rales.  Musculoskeletal:     Right lower leg: No edema.     Left lower leg: No edema.  Neurological:     General: No focal deficit present.     Mental Status: She is alert and oriented to person, place, and time.     Cranial Nerves: No cranial nerve deficit.     Motor: No weakness.     Coordination: Coordination normal.     Gait: Gait normal.  Psychiatric:        Behavior: Behavior normal.        Thought Content: Thought content normal.        Judgment: Judgment normal.     Comments: Tearful            Assessment & Plan:  She is dealing with a number of lingering effects of her recent Covid infection. I again advised her not to drive yet. For insomnia, she will stop Temazepam and try Zolpidem 10 mg at bedtime. For the headaches, she will use Tramadol as needed. For the concentration and memory issues, we will refer her to Neurology. Follow up as needed. We spent 40 minutes today discussing these issues.  Gershon Crane, MD

## 2020-10-06 ENCOUNTER — Encounter: Payer: Self-pay | Admitting: Family Medicine

## 2020-10-15 ENCOUNTER — Emergency Department (HOSPITAL_COMMUNITY): Payer: 59 | Admitting: Anesthesiology

## 2020-10-15 ENCOUNTER — Encounter (HOSPITAL_BASED_OUTPATIENT_CLINIC_OR_DEPARTMENT_OTHER): Payer: Self-pay | Admitting: Emergency Medicine

## 2020-10-15 ENCOUNTER — Emergency Department (HOSPITAL_BASED_OUTPATIENT_CLINIC_OR_DEPARTMENT_OTHER): Payer: 59

## 2020-10-15 ENCOUNTER — Encounter (HOSPITAL_COMMUNITY)
Admission: EM | Disposition: A | Discharge status: Home / Self Care | Payer: Self-pay | Source: Home / Self Care | Attending: Emergency Medicine

## 2020-10-15 ENCOUNTER — Other Ambulatory Visit: Payer: Self-pay

## 2020-10-15 ENCOUNTER — Ambulatory Visit (HOSPITAL_BASED_OUTPATIENT_CLINIC_OR_DEPARTMENT_OTHER)
Admission: EM | Admit: 2020-10-15 | Discharge: 2020-10-15 | Disposition: A | Discharge status: Home / Self Care | Payer: 59 | Attending: Urology | Admitting: Urology

## 2020-10-15 ENCOUNTER — Emergency Department (HOSPITAL_COMMUNITY): Payer: 59

## 2020-10-15 DIAGNOSIS — I129 Hypertensive chronic kidney disease with stage 1 through stage 4 chronic kidney disease, or unspecified chronic kidney disease: Secondary | ICD-10-CM | POA: Insufficient documentation

## 2020-10-15 DIAGNOSIS — Z20822 Contact with and (suspected) exposure to covid-19: Secondary | ICD-10-CM | POA: Diagnosis not present

## 2020-10-15 DIAGNOSIS — U099 Post covid-19 condition, unspecified: Secondary | ICD-10-CM | POA: Insufficient documentation

## 2020-10-15 DIAGNOSIS — Z79899 Other long term (current) drug therapy: Secondary | ICD-10-CM | POA: Diagnosis not present

## 2020-10-15 DIAGNOSIS — R29818 Other symptoms and signs involving the nervous system: Secondary | ICD-10-CM | POA: Insufficient documentation

## 2020-10-15 DIAGNOSIS — Z8632 Personal history of gestational diabetes: Secondary | ICD-10-CM | POA: Diagnosis not present

## 2020-10-15 DIAGNOSIS — N200 Calculus of kidney: Secondary | ICD-10-CM | POA: Diagnosis present

## 2020-10-15 DIAGNOSIS — Z8249 Family history of ischemic heart disease and other diseases of the circulatory system: Secondary | ICD-10-CM | POA: Insufficient documentation

## 2020-10-15 DIAGNOSIS — Z833 Family history of diabetes mellitus: Secondary | ICD-10-CM | POA: Diagnosis not present

## 2020-10-15 DIAGNOSIS — N132 Hydronephrosis with renal and ureteral calculous obstruction: Secondary | ICD-10-CM | POA: Diagnosis not present

## 2020-10-15 DIAGNOSIS — N182 Chronic kidney disease, stage 2 (mild): Secondary | ICD-10-CM | POA: Diagnosis not present

## 2020-10-15 DIAGNOSIS — R0781 Pleurodynia: Secondary | ICD-10-CM

## 2020-10-15 HISTORY — PX: CYSTOSCOPY WITH RETROGRADE PYELOGRAM, URETEROSCOPY AND STENT PLACEMENT: SHX5789

## 2020-10-15 LAB — COMPREHENSIVE METABOLIC PANEL
ALT: 18 U/L (ref 0–44)
AST: 23 U/L (ref 15–41)
Albumin: 3.7 g/dL (ref 3.5–5.0)
Alkaline Phosphatase: 100 U/L (ref 38–126)
Anion gap: 8 (ref 5–15)
BUN: 19 mg/dL (ref 6–20)
CO2: 26 mmol/L (ref 22–32)
Calcium: 9.1 mg/dL (ref 8.9–10.3)
Chloride: 105 mmol/L (ref 98–111)
Creatinine, Ser: 1.35 mg/dL — ABNORMAL HIGH (ref 0.44–1.00)
GFR, Estimated: 48 mL/min — ABNORMAL LOW (ref >60)
Glucose, Bld: 133 mg/dL — ABNORMAL HIGH (ref 70–99)
Potassium: 3.5 mmol/L (ref 3.5–5.1)
Sodium: 139 mmol/L (ref 135–145)
Total Bilirubin: 0.2 mg/dL — ABNORMAL LOW (ref 0.3–1.2)
Total Protein: 7.1 g/dL (ref 6.5–8.1)

## 2020-10-15 LAB — RESP PANEL BY RT-PCR (FLU A&B, COVID) ARPGX2
Influenza A by PCR: NEGATIVE
Influenza B by PCR: NEGATIVE
SARS Coronavirus 2 by RT PCR: NEGATIVE

## 2020-10-15 LAB — CBC
HCT: 33.4 % — ABNORMAL LOW (ref 36.0–46.0)
Hemoglobin: 11.2 g/dL — ABNORMAL LOW (ref 12.0–15.0)
MCH: 31.7 pg (ref 26.0–34.0)
MCHC: 33.5 g/dL (ref 30.0–36.0)
MCV: 94.6 fL (ref 80.0–100.0)
Platelets: 398 10*3/uL (ref 150–400)
RBC: 3.53 MIL/uL — ABNORMAL LOW (ref 3.87–5.11)
RDW: 13.6 % (ref 11.5–15.5)
WBC: 14.4 10*3/uL — ABNORMAL HIGH (ref 4.0–10.5)
nRBC: 0 % (ref 0.0–0.2)

## 2020-10-15 LAB — URINALYSIS, ROUTINE W REFLEX MICROSCOPIC
Bilirubin Urine: NEGATIVE
Glucose, UA: 250 mg/dL — AB
Hgb urine dipstick: NEGATIVE
Ketones, ur: NEGATIVE mg/dL
Leukocytes,Ua: NEGATIVE
Nitrite: NEGATIVE
Protein, ur: NEGATIVE mg/dL
Specific Gravity, Urine: 1.015 (ref 1.005–1.030)
pH: 8 (ref 5.0–8.0)

## 2020-10-15 SURGERY — CYSTOURETEROSCOPY, WITH RETROGRADE PYELOGRAM AND STENT INSERTION
Anesthesia: General | Site: Ureter | Laterality: Left

## 2020-10-15 MED ORDER — PHENAZOPYRIDINE HCL 200 MG PO TABS: 200.0000 mg | ORAL_TABLET | Freq: Three times a day (TID) | ORAL | 0 refills | Status: DC | PRN

## 2020-10-15 MED ORDER — KETOROLAC TROMETHAMINE 15 MG/ML IJ SOLN
15.0000 mg | Freq: Once | INTRAMUSCULAR | Status: DC
  Filled 2020-10-15: qty 1

## 2020-10-15 MED ORDER — ONDANSETRON HCL 4 MG/2ML IJ SOLN
4.0000 mg | Freq: Once | INTRAMUSCULAR | Status: AC
  Administered 2020-10-15: 4 mg via INTRAVENOUS
  Filled 2020-10-15: qty 2

## 2020-10-15 MED ORDER — PROMETHAZINE HCL 25 MG/ML IJ SOLN
12.5000 mg | Freq: Once | INTRAMUSCULAR | Status: AC
  Administered 2020-10-15: 12.5 mg via INTRAMUSCULAR
  Filled 2020-10-15: qty 1

## 2020-10-15 MED ORDER — PROPOFOL 10 MG/ML IV BOLUS
INTRAVENOUS | Status: AC
  Filled 2020-10-15: qty 20

## 2020-10-15 MED ORDER — BELLADONNA ALKALOIDS-OPIUM 16.2-30 MG RE SUPP
RECTAL | Status: AC
  Filled 2020-10-15: qty 1

## 2020-10-15 MED ORDER — ONDANSETRON 4 MG PO TBDP
4.0000 mg | ORAL_TABLET | Freq: Once | ORAL | Status: AC
  Administered 2020-10-15: 4 mg via ORAL
  Filled 2020-10-15: qty 1

## 2020-10-15 MED ORDER — FENTANYL CITRATE (PF) 100 MCG/2ML IJ SOLN
100.0000 ug | Freq: Once | INTRAMUSCULAR | Status: AC
  Administered 2020-10-15: 100 ug via INTRAVENOUS
  Filled 2020-10-15: qty 2

## 2020-10-15 MED ORDER — FENTANYL CITRATE (PF) 100 MCG/2ML IJ SOLN: 25.0000 ug | INTRAMUSCULAR | Status: DC | PRN

## 2020-10-15 MED ORDER — MIDAZOLAM HCL 2 MG/2ML IJ SOLN
INTRAMUSCULAR | Status: AC
  Filled 2020-10-15: qty 2

## 2020-10-15 MED ORDER — FENTANYL CITRATE (PF) 100 MCG/2ML IJ SOLN
50.0000 ug | Freq: Once | INTRAMUSCULAR | Status: AC
  Administered 2020-10-15: 50 ug via INTRAMUSCULAR
  Filled 2020-10-15: qty 2

## 2020-10-15 MED ORDER — SODIUM CHLORIDE 0.9 % IR SOLN
Status: DC | PRN
  Administered 2020-10-15: 3000 mL

## 2020-10-15 MED ORDER — CIPROFLOXACIN HCL 500 MG PO TABS: 500.0000 mg | ORAL_TABLET | Freq: Once | ORAL | 0 refills | Status: AC

## 2020-10-15 MED ORDER — LACTATED RINGERS IV SOLN: INTRAVENOUS | Status: DC | PRN

## 2020-10-15 MED ORDER — SODIUM CHLORIDE 0.9 % IV BOLUS
1000.0000 mL | Freq: Once | INTRAVENOUS | Status: AC
  Administered 2020-10-15: 1000 mL via INTRAVENOUS

## 2020-10-15 MED ORDER — LIDOCAINE HCL URETHRAL/MUCOSAL 2 % EX GEL
CUTANEOUS | Status: DC | PRN
  Administered 2020-10-15: 1 via URETHRAL

## 2020-10-15 MED ORDER — FENTANYL CITRATE (PF) 100 MCG/2ML IJ SOLN
INTRAMUSCULAR | Status: DC | PRN
  Administered 2020-10-15 (×2): 50 ug via INTRAVENOUS

## 2020-10-15 MED ORDER — CEFAZOLIN SODIUM-DEXTROSE 2-3 GM-%(50ML) IV SOLR
INTRAVENOUS | Status: DC | PRN
Start: 2020-10-15 — End: 2020-10-15
  Administered 2020-10-15: 2 g via INTRAVENOUS

## 2020-10-15 MED ORDER — PROPOFOL 500 MG/50ML IV EMUL
INTRAVENOUS | Status: DC | PRN
  Administered 2020-10-15: 150 mg via INTRAVENOUS

## 2020-10-15 MED ORDER — LIDOCAINE HCL URETHRAL/MUCOSAL 2 % EX GEL
CUTANEOUS | Status: AC
  Filled 2020-10-15: qty 30

## 2020-10-15 MED ORDER — PHENYLEPHRINE 40 MCG/ML (10ML) SYRINGE FOR IV PUSH (FOR BLOOD PRESSURE SUPPORT)
PREFILLED_SYRINGE | INTRAVENOUS | Status: AC
  Filled 2020-10-15: qty 20

## 2020-10-15 MED ORDER — DEXAMETHASONE SODIUM PHOSPHATE 10 MG/ML IJ SOLN
INTRAMUSCULAR | Status: AC
  Filled 2020-10-15: qty 1

## 2020-10-15 MED ORDER — CEFAZOLIN SODIUM-DEXTROSE 2-4 GM/100ML-% IV SOLN
INTRAVENOUS | Status: AC
  Filled 2020-10-15: qty 100

## 2020-10-15 MED ORDER — BELLADONNA ALKALOIDS-OPIUM 16.2-60 MG RE SUPP
RECTAL | Status: DC | PRN
  Administered 2020-10-15: 1 via RECTAL

## 2020-10-15 MED ORDER — KETOROLAC TROMETHAMINE 30 MG/ML IJ SOLN: 30.0000 mg | Freq: Once | INTRAMUSCULAR | Status: DC | PRN

## 2020-10-15 MED ORDER — METHOCARBAMOL 500 MG PO TABS
1000.0000 mg | ORAL_TABLET | Freq: Once | ORAL | Status: AC
  Administered 2020-10-15: 1000 mg via ORAL
  Filled 2020-10-15: qty 2

## 2020-10-15 MED ORDER — MIDAZOLAM HCL 2 MG/2ML IJ SOLN
INTRAMUSCULAR | Status: DC | PRN
  Administered 2020-10-15: 2 mg via INTRAVENOUS

## 2020-10-15 MED ORDER — IOHEXOL 300 MG/ML  SOLN
INTRAMUSCULAR | Status: DC | PRN
  Administered 2020-10-15: 8 mL

## 2020-10-15 MED ORDER — FENTANYL CITRATE (PF) 100 MCG/2ML IJ SOLN
INTRAMUSCULAR | Status: AC
  Filled 2020-10-15: qty 2

## 2020-10-15 MED ORDER — TRAMADOL HCL 50 MG PO TABS: 50.0000 mg | ORAL_TABLET | Freq: Four times a day (QID) | ORAL | 2 refills | Status: DC | PRN

## 2020-10-15 MED ORDER — ONDANSETRON HCL 4 MG/2ML IJ SOLN
INTRAMUSCULAR | Status: AC
  Filled 2020-10-15: qty 2

## 2020-10-15 MED ORDER — DEXAMETHASONE SODIUM PHOSPHATE 10 MG/ML IJ SOLN
INTRAMUSCULAR | Status: DC | PRN
  Administered 2020-10-15: 10 mg via INTRAVENOUS

## 2020-10-15 MED ORDER — LIDOCAINE 2% (20 MG/ML) 5 ML SYRINGE
INTRAMUSCULAR | Status: DC | PRN
  Administered 2020-10-15: 50 mg via INTRAVENOUS

## 2020-10-15 MED ORDER — FENTANYL CITRATE (PF) 100 MCG/2ML IJ SOLN
50.0000 ug | Freq: Once | INTRAMUSCULAR | Status: AC
  Administered 2020-10-15: 50 ug via INTRAVENOUS
  Filled 2020-10-15: qty 2

## 2020-10-15 SURGICAL SUPPLY — 19 items
BAG URO CATCHER STRL LF (MISCELLANEOUS) ×2 IMPLANT
BASKET ZERO TIP NITINOL 2.4FR (BASKET) IMPLANT
BSKT STON RTRVL ZERO TP 2.4FR (BASKET)
CATH URET 5FR 28IN OPEN ENDED (CATHETERS) ×2 IMPLANT
CLOTH BEACON ORANGE TIMEOUT ST (SAFETY) ×2 IMPLANT
EXTRACTOR STONE 1.7FRX115CM (UROLOGICAL SUPPLIES) ×2 IMPLANT
GLOVE SURG ENC TEXT LTX SZ7.5 (GLOVE) ×2 IMPLANT
GOWN STRL REUS W/TWL XL LVL3 (GOWN DISPOSABLE) ×2 IMPLANT
GUIDEWIRE ANG ZIPWIRE 038X150 (WIRE) IMPLANT
GUIDEWIRE STR DUAL SENSOR (WIRE) ×2 IMPLANT
KIT TURNOVER KIT A (KITS) ×2 IMPLANT
LASER FIB FLEXIVA PULSE ID 365 (Laser) ×1 IMPLANT
MANIFOLD NEPTUNE II (INSTRUMENTS) ×2 IMPLANT
PACK CYSTO (CUSTOM PROCEDURE TRAY) ×2 IMPLANT
SHEATH URETERAL 12FRX28CM (UROLOGICAL SUPPLIES) IMPLANT
SHEATH URETERAL 12FRX35CM (MISCELLANEOUS) IMPLANT
STENT URET 6FRX24 CONTOUR (STENTS) ×1 IMPLANT
TUBING CONNECTING 10 (TUBING) ×2 IMPLANT
TUBING UROLOGY SET (TUBING) ×2 IMPLANT

## 2020-10-15 NOTE — ED Notes (Signed)
Report given to Nicole RN with [facility] 

## 2020-10-15 NOTE — ED Provider Notes (Addendum)
[facility] Emergency Department Provider Note MRN:  [id number]  Arrival date & time: 10/15/20     Chief Complaint   Flank Pain   History of Present Illness   Cynthia Roach is a 50 y.o. year-old female with a history of hypertension presenting to the ED with chief complaint of flank pain.  Patient explains that she lost her balance due to vertigo, which is a chronic problem for her for the past 3 months. She struck her left flank/left lateral ribs on the doggy gate at her home. Has been having persistent pain in this area since then, getting a bit worse today with some nausea. Denies lightheadedness or fainting spells, no blood in urine, no chest pain or shortness of breath, no lower abdominal pain. Pain is mild to moderate, constant, worse with palpation.  Review of Systems  A complete 10 system review of systems was obtained and all systems are negative except as noted in the HPI and PMH.   Patient's Health History    Past Medical History:  Diagnosis Date  . Chronic kidney disease    left ureteral stone  . Hypertension   . Ovarian cyst   . Seasonal allergies   . Trigeminal neuralgia     Past Surgical History:  Procedure Laterality Date  . CESAREAN SECTION      Family History  Problem Relation Age of Onset  . Alcohol abuse Other   . Arthritis Other   . Breast cancer Other   . Colon cancer Other   . Diabetes Other   . Hyperlipidemia Other   . Hypertension Other   . Prostate cancer Other   . Depression Other   . Stroke Other   . Sudden death Other   . Coronary artery disease Other     Social History   Socioeconomic History  . Marital status: Married    Spouse name: Not on file  . Number of children: Not on file  . Years of education: Not on file  . Highest education level: Not on file  Occupational History  . Not on file  Tobacco Use  . Smoking status: Never Smoker  . Smokeless tobacco: Never Used  Substance and Sexual  Activity  . Alcohol use: No    Alcohol/week: 0.0 standard drinks  . Drug use: No  . Sexual activity: Not on file  Other Topics Concern  . Not on file  Social History Narrative  . Not on file   Social Determinants of Health   Financial Resource Strain: Not on file  Food Insecurity: Not on file  Transportation Needs: Not on file  Physical Activity: Not on file  Stress: Not on file  Social Connections: Not on file  Intimate Partner Violence: Not on file     Physical Exam   Vitals:   10/15/20 0144 10/15/20 0405  BP: (!) 173/100 (!) 170/95  Pulse:  (!) 108  Resp: 18 16  Temp: 98.3 F (36.8 C)   SpO2: 96% 99%    CONSTITUTIONAL: Well-appearing, NAD NEURO:  Alert and oriented x 3, no focal deficits EYES:  eyes equal and reactive ENT/NECK:  no LAD, no JVD CARDIO: Regular rate, well-perfused, normal S1 and S2 PULM:  CTAB no wheezing or rhonchi GI/GU:  normal bowel sounds, non-distended, tender to the left upper quadrant MSK/SPINE:  No gross deformities, no edema SKIN:  no rash, atraumatic PSYCH:  Appropriate speech and behavior  *Additional and/or pertinent findings included in MDM below  Diagnostic and Interventional Summary    EKG Interpretation  Date/Time:    Ventricular Rate:    PR Interval:    QRS Duration:   QT Interval:    QTC Calculation:   R Axis:     Text Interpretation:        Labs Reviewed  CBC - Abnormal; Notable for the following components:      Result Value   WBC 14.4 (*)    RBC 3.53 (*)    Hemoglobin 11.2 (*)    HCT 33.4 (*)    All other components within normal limits  COMPREHENSIVE METABOLIC PANEL - Abnormal; Notable for the following components:   Glucose, Bld 133 (*)    Creatinine, Ser 1.35 (*)    Total Bilirubin 0.2 (*)    GFR, Estimated 48 (*)    All other components within normal limits  URINALYSIS, ROUTINE W REFLEX MICROSCOPIC - Abnormal; Notable for the following components:   Color, Urine STRAW (*)    Glucose, UA 250 (*)     All other components within normal limits  RESP PANEL BY RT-PCR (FLU A&B, COVID) [id number]    CT ABDOMEN PELVIS WO CONTRAST  Final Result    DG Chest 2 View  Final Result      Medications  ondansetron (ZOFRAN-ODT) disintegrating tablet 4 mg (4 mg Oral Given 10/15/20 0204)  methocarbamol (ROBAXIN) tablet 1,000 mg (1,000 mg Oral Given 10/15/20 0203)  promethazine (PHENERGAN) injection 12.5 mg (12.5 mg Intramuscular Given 10/15/20 0329)  fentaNYL (SUBLIMAZE) injection 50 mcg (50 mcg Intramuscular Given 10/15/20 0330)  sodium chloride 0.9 % bolus 1,000 mL (0 mLs Intravenous Stopped 10/15/20 0520)  ondansetron (ZOFRAN) injection 4 mg (4 mg Intravenous Given 10/15/20 0405)  fentaNYL (SUBLIMAZE) injection 50 mcg (50 mcg Intravenous Given 10/15/20 0404)     Procedures  /  Critical Care Procedures  ED Course and Medical Decision Making  I have reviewed the triage vital signs, the nursing notes, and pertinent available records from the EMR.  Listed above are laboratory and imaging tests that I personally ordered, reviewed, and interpreted and then considered in my medical decision making (see below for details).  Suspect MSK related pain. Also considering rib fracture, pneumothorax. Less likely to be intra-abdominal injury given that this trauma occurred a week ago I doubt that she has splenic bleeding. Will obtain screening labs and x-ray and provide pain management. Clinical Course as of 10/15/20 4235  Wynelle Link Oct 15, 2020  0320 Labs reveal AKI, pain has continued and now patient is vomiting.  Will obtain CT imaging to further evaluate. [MB]  0354 CT reveals 7 mm kidney stone with hydronephrosis, explaining patient's flank pain.  Seems that patient's trauma was coincidental, unsure if trauma can trigger a kidney stone to fall into the ureter.  Either way the stone is large and causing AKI and continued significant symptoms.  We will continue to attempt symptom control, obtain urinalysis, discussed with  urology. [MB]    Clinical Course User Index [MB] Sabas Sous, MD     Discussed case with Dr. Marlou Porch of urology, given patient's continued significant symptoms and large size of stone, options were discussed with patient and we will proceed with procedural removal stone today.  Suspect patient will be transferred to [facility] shortly.  Plan is for patient to be transferred to [facility] emergency department to undergo this procedure by Dr. Marlou Porch. Dr. Marlou Porch requested to be informed upon patient's arrival. Patient likely will be able to  be discharged after the procedure and so does not need to be admitted as of yet. Dr. Read Drivers is the accepting ED physician for transfer at [facility].  Elmer Sow. Pilar Plate, MD [facility] Emergency Medicine [facility] [email or web address]  Final Clinical Impressions(s) / ED Diagnoses     ICD-10-CM   1. Kidney stone  N20.0   2. Rib pain on left side  R07.81 DG Chest 2 View    DG Chest 2 View    ED Discharge Orders    None       Discharge Instructions Discussed with and Provided to Patient:   Discharge Instructions   None       Sabas Sous, MD 10/15/20 5631    Sabas Sous, MD 10/15/20 [phone]

## 2020-10-15 NOTE — H&P (Signed)
I have been asked to see the patient by Dr. Kennis Carina, for evaluation and management of Left distal ureteral stone and poorly controlled pain.  History of present illness: 50 year old female who presented to the emergency department with a week of left-sided flank pain.  Initially at the she thought that the injury was associated with the fall.  However, further evaluation demonstrated slight worsening of her renal function and some red blood cells in her urine.  A CT scan was then obtained which demonstrated a 7 mm left distal ureteral stone with proximal hydronephrosis.  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  In the ER the patient was unable to get comfortable with IV pain medication and as such she was transferred to the [facility] emergency department for further intervention.  The patient has never had any kidney stone surgeries before.  She has no fever or chills.  She denies any dysuria.  Review of systems: A 12 point comprehensive review of systems was obtained and is negative unless otherwise stated in the history of present illness.  Patient Active Problem List   Diagnosis Date Noted  . 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 Pneumonia due to COVID-19 virus 08/07/2020  . Foot sprain, left, subsequent encounter 05/16/2020  . Menopause 10/05/2019  . Palpitations 10/05/2019  . SOB (shortness of breath) 10/05/2019  . Cellulitis of face 08/28/2018  . CKD (chronic kidney disease), stage II 09/18/2016  . Gestational diabetes 05/18/2014  . Trigeminal neuralgia 05/11/2013  . GOITER 02/10/2009  . Essential hypertension 04/29/2007  . HEADACHE 04/29/2007    No current facility-administered medications on file prior to encounter.   Current Outpatient Medications on File Prior to Encounter  Medication Sig Dispense Refill  . amLODipine (NORVASC) 10 MG tablet Take 1 tablet (10 mg  total) by mouth daily. 90 tablet 3  . carbamazepine (CARBATROL) 300 MG 12 hr capsule Take 1 capsule (300 mg total) by mouth 2 (two) times daily. 60 capsule 2  . EPINEPHrine (EPIPEN 2-PAK) 0.3 mg/0.3 mL IJ SOAJ injection Inject 0.3 mLs (0.3 mg total) into the muscle once. (Patient taking differently: Inject 0.3 mg into the muscle daily as needed for anaphylaxis.) 1 Device 5  . gabapentin (NEURONTIN) 300 MG capsule Take 300 mg by mouth 3 (three) times daily.    Marland Kitchen latanoprost (XALATAN) 0.005 % ophthalmic solution Place 1 drop into both eyes at bedtime.    Marland Kitchen losartan-hydrochlorothiazide (HYZAAR) 100-25 MG tablet Take 1 tablet by mouth daily. 90 tablet 3  . tretinoin (RETIN-A) 0.05 % cream Apply 1 application topically at bedtime.    Marland Kitchen zolpidem (AMBIEN) 10 MG tablet Take 1 tablet (10 mg total) by mouth at bedtime as needed for sleep. 30 tablet 2  . pantoprazole (PROTONIX) 40 MG tablet Take 1 tablet (40 mg total) by mouth daily for 10 days. (Patient not taking: Reported on 10/15/2020) 10 tablet 0  . traMADol (ULTRAM) 50 MG tablet Take 2 tablets (100 mg total) by mouth every 6 (six) hours as needed for moderate pain. (Patient not taking: No sig reported) 60 tablet 2    Past Medical History:  Diagnosis Date  . Chronic kidney disease    left ureteral stone  . Hypertension   . Ovarian cyst   . Seasonal allergies   . Trigeminal neuralgia     Past Surgical History:  Procedure Laterality Date  . CESAREAN SECTION  Social History   Tobacco Use  . Smoking status: Never Smoker  . Smokeless tobacco: Never Used  Substance Use Topics  . Alcohol use: No    Alcohol/week: 0.0 standard drinks  . Drug use: No    Family History  Problem Relation Age of Onset  . Alcohol abuse Other   . Arthritis Other   . Breast cancer Other   . Colon cancer Other   . Diabetes Other   . Hyperlipidemia Other   . Hypertension Other   . Prostate cancer Other   . Depression Other   . Stroke Other   . Sudden  death Other   . Coronary artery disease Other     PE: Vitals:   10/15/20 0615 10/15/20 0745 10/15/20 1049 10/15/20 1200  BP: (!) 166/97 (!) 192/109 (!) 170/105 (!) 172/107  Pulse: 94 98 90 91  Resp: 14 16 16 18   Temp:  98.7 F (37.1 C) 98.9 F (37.2 C)   TempSrc:  Oral Oral   SpO2: 94% 100% 100% 98%  Weight:      Height:       Patient appears to be in no acute distress  patient is alert and oriented x3 Atraumatic normocephalic head No cervical or supraclavicular lymphadenopathy appreciated No increased work of breathing, no audible wheezes/rhonchi Regular sinus rhythm/rate Abdomen is soft, nondistended, left lower quadrant and CVA tenderness Lower extremities are symmetric without appreciable edema Grossly neurologically intact No identifiable skin lesions  Recent Labs    10/15/20 0209  WBC 14.4*  HGB 11.2*  HCT 33.4*   Recent Labs    10/15/20 0209  NA 139  K 3.5  CL 105  CO2 26  GLUCOSE 133*  BUN 19  CREATININE 1.35*  CALCIUM 9.1   No results for input(s): LABPT, INR in the last 72 hours. No results for input(s): LABURIN in the last 72 hours. Results for orders placed or performed during the hospital encounter of 10/15/20  Resp Panel by RT-PCR (Flu A&B, Covid) Nasopharyngeal Swab     Status: None   Collection Time: 10/15/20  6:17 AM   Specimen: Nasopharyngeal Swab; Nasopharyngeal(NP) swabs in vial transport medium  Result Value Ref Range Status   SARS Coronavirus 2 by RT PCR NEGATIVE NEGATIVE Final    Comment: (NOTE) SARS-CoV-2 target nucleic acids are NOT DETECTED.  The SARS-CoV-2 RNA is generally detectable in upper respiratory specimens during the acute phase of infection. The lowest concentration of SARS-CoV-2 viral copies this assay can detect is 138 copies/mL. A negative result does not preclude SARS-Cov-2 infection and should not be used as the sole basis for treatment or other patient management decisions. A negative result may occur with   improper specimen collection/handling, submission of specimen other than nasopharyngeal swab, presence of viral mutation(s) within the areas targeted by this assay, and inadequate number of viral copies(<138 copies/mL). A negative result must be combined with clinical observations, patient history, and epidemiological information. The expected result is Negative.  Fact Sheet for Patients:  10/17/20  Fact Sheet for Healthcare Providers:  BloggerCourse.com  This test is no t yet approved or cleared by the [place] FDA and  has been authorized for detection and/or diagnosis of SARS-CoV-2 by FDA under an Emergency Use Authorization (EUA). This EUA will remain  in effect (meaning this test can be used) for the duration of the COVID-19 declaration under Section 564(b)(1) of the Act, 21 U.S.C.section 360bbb-3(b)(1), unless the authorization is terminated  or revoked sooner.  Influenza A by PCR NEGATIVE NEGATIVE Final   Influenza B by PCR NEGATIVE NEGATIVE Final    Comment: (NOTE) The Xpert Xpress SARS-CoV-2/FLU/RSV plus assay is intended as an aid in the diagnosis of influenza from Nasopharyngeal swab specimens and should not be used as a sole basis for treatment. Nasal washings and aspirates are unacceptable for Xpert Xpress SARS-CoV-2/FLU/RSV testing.  Fact Sheet for Patients: BloggerCourse.com  Fact Sheet for Healthcare Providers: SeriousBroker.it  This test is not yet approved or cleared by the [place] FDA and has been authorized for detection and/or diagnosis of SARS-CoV-2 by FDA under an Emergency Use Authorization (EUA). This EUA will remain in effect (meaning this test can be used) for the duration of the COVID-19 declaration under Section 564(b)(1) of the Act, 21 U.S.C. section 360bbb-3(b)(1), unless the authorization is terminated  or revoked.  Performed at [facility], [address] [place], [place] [address]     Imaging: I have independently reviewed the patient's CT scan with the notable findings in the HPI.  Basically she has a 7 mm left distal stone with proximal hydronephrosis the needs to be managed.  Imp: The patient has a 7 mm left distal ureteral stone and poorly controlled pain.  Recommendations: We discussed the management strategies for this patient including medical expulsion therapy, shockwave lithotripsy, and ureteroscopy.  The patient is in quite a bit of pain and is anxious to remove the stone.  Fortunately there is no evidence of infection.  We discussed ureteroscopy, laser lithotripsy and stent placement.  After going through the surgery including the risk and the benefits the patient is opted to proceed.  Following the procedure she will be discharged home.   Crist Fat

## 2020-10-15 NOTE — ED Provider Notes (Signed)
  Physical Exam  BP (!) 192/109 (BP Location: Right Arm)   Pulse 98   Temp 98.7 F (37.1 C) (Oral)   Resp 16   Ht 5\' 6"  (1.676 m)   Wt 73.5 kg   LMP 10/01/2020   SpO2 100%   BMI 26.15 kg/m   Physical Exam Vitals and nursing note reviewed.  Constitutional:      General: She is not in acute distress.    Appearance: Normal appearance. She is not toxic-appearing.  Cardiovascular:     Rate and Rhythm: Normal rate and regular rhythm.  Neurological:     Mental Status: She is alert.     ED Course/Procedures   Clinical Course as of 10/15/20 0915  10/17/20 Oct 15, 2020  0320 Labs reveal AKI, pain has continued and now patient is vomiting.  Will obtain CT imaging to further evaluate. [MB]  0354 CT reveals 7 mm kidney stone with hydronephrosis, explaining patient's flank pain.  Seems that patient's trauma was coincidental, unsure if trauma can trigger a kidney stone to fall into the ureter.  Either way the stone is large and causing AKI and continued significant symptoms.  We will continue to attempt symptom control, obtain urinalysis, discussed with urology. [MB]    Clinical Course User Index [MB] Oct 17, 2020, MD    Procedures  MDM   Patient seen at [facility] ER.  Diagnosed with a large, 7 mm, kidney stone with hydronephrosis.  Difficulty controlling patient's symptoms.  As such, urology was called, recommended to be transferred to [facility], ER with plan for procedure for the kidney stone.  On arrival, patient reports pain is severe.  Nausea is controlled.  Will give further pain medication.  Urology consulted.  Discussed with [facility]. [facility] from urology, who will evaluate the patient.  Unfortunately, surgery will likely be delayed until later this afternoon due to operating room schedule.      Marlou Porch, PA-C 10/15/20 10/17/20    [place], [id number], MD 10/15/20 [phone]

## 2020-10-15 NOTE — Transfer of Care (Signed)
Immediate Anesthesia Transfer of Care Note  Patient: Cynthia Roach  Procedure(s) Performed: Procedure(s): CYSTOSCOPY WITH RETROGRADE PYELOGRAM, URETEROSCOPY AND STENT PLACEMENT laser lithotrispsy and basket extraction (Left)  Patient Location: PACU  Anesthesia Type:General  Level of Consciousness: Alert, Awake, Oriented  Airway & Oxygen Therapy: Patient Spontanous Breathing  Post-op Assessment: Report given to RN  Post vital signs: Reviewed and stable  Last Vitals:  Vitals:   10/15/20 1049 10/15/20 1200  BP: (!) 170/105 (!) 172/107  Pulse: 90 91  Resp: 16 18  Temp: 37.2 C   SpO2: 100% 98%    Complications: No apparent anesthesia complications

## 2020-10-15 NOTE — Op Note (Signed)
Preoperative diagnosis: left ureteral calculus  Postoperative diagnosis: left ureteral calculus  Procedure:  1. Cystoscopy 2. left ureteroscopy and stone removal 3. Ureteroscopic laser lithotripsy 4. left 63F x 24cm ureteral stent placement  5. left retrograde pyelography with interpretation  Surgeon: Crist Fat, MD  Anesthesia: General  Complications: None  Intraoperative findings: left retrograde pyelography demonstrated a filling defect within the left ureter consistent with the patient's known calculus without other abnormalities.  EBL: Minimal  Specimens: 1. left ureteral calculus  Disposition of specimens: [facility] for stone analysis  Indication: Cynthia Roach is a 50 y.o.   patient with a left ureteral stone and associated left symptoms. After reviewing the management options for treatment, the patient elected to proceed with the above surgical procedure(s). We have discussed the potential benefits and risks of the procedure, side effects of the proposed treatment, the likelihood of the patient achieving the goals of the procedure, and any potential problems that might occur during the procedure or recuperation. Informed consent has been obtained.   Description of procedure:  The patient was taken to the operating room and general anesthesia was induced.  The patient was placed in the dorsal lithotomy position, prepped and draped in the usual sterile fashion, and preoperative antibiotics were administered. A preoperative time-out was performed.   Cystourethroscopy was performed.  The patient's urethra was examined and was normal. The bladder was then systematically examined in its entirety. There was no evidence for any bladder tumors, stones, or other mucosal pathology.    Attention then turned to the left ureteral orifice and a ureteral catheter was used to intubate the ureteral orifice.  Omnipaque contrast was injected through the  ureteral catheter and a retrograde pyelogram was performed with findings as dictated above.  A 0.38 sensor guidewire was then advanced up the left ureter into the renal pelvis under fluoroscopic guidance. The 6 Fr semirigid ureteroscope was then advanced into the ureter next to the guidewire and the calculus was identified.   The stone was then fragmented with the 365 micron holmium laser fiber on a setting of 1.0 and frequency of 10 Hz.   All stones were then removed from the ureter with an N-gage nitinol basket.  Reinspection of the ureter revealed no remaining visible stones or fragments.   The wire was then backloaded through the cystoscope and a ureteral stent was advance over the wire using Seldinger technique.  The stent was positioned appropriately under fluoroscopic and cystoscopic guidance.  The wire was then removed with an adequate stent curl noted in the renal pelvis as well as in the bladder.  The bladder was then emptied and the procedure ended.  The patient appeared to tolerate the procedure well and without complications.  The patient was able to be awakened and transferred to the recovery unit in satisfactory condition.   Disposition: The tether of the stent was left on and tucked inside the patient's vagina.  Instructions for removing the stent have been provided to the patient. The patient has been scheduled for followup in 6 weeks with a renal ultrasound.

## 2020-10-15 NOTE — Interval H&P Note (Signed)
History and Physical Interval Note:  10/15/2020 12:55 PM  Cynthia Roach  has presented today for surgery, with the diagnosis of left ureteral obstruction.  The various methods of treatment have been discussed with the patient and family. After consideration of risks, benefits and other options for treatment, the patient has consented to  Procedure(s): CYSTOSCOPY WITH RETROGRADE PYELOGRAM, URETEROSCOPY AND STENT PLACEMENT (Left) as a surgical intervention.  The patient's history has been reviewed, patient examined, no change in status, stable for surgery.  I have reviewed the patient's chart and labs.  Questions were answered to the patient's satisfaction.     Crist Fat

## 2020-10-15 NOTE — ED Notes (Signed)
Pt SpO2 dropped to 86% after fentanyl administration. Placed pt on 2L [place]. SpO2 97%

## 2020-10-15 NOTE — Discharge Instructions (Signed)
DISCHARGE INSTRUCTIONS FOR KIDNEY STONE/URETERAL STENT   MEDICATIONS:  1.  Resume all your other meds from home - except do not take any extra narcotic pain meds that you may have at home.  2. Pyridium is to help with the burning/stinging when you urinate. 3. Tramadol is for moderate/severe pain, otherwise taking upto 1000 mg every 6 hours of plainTylenol will help treat your pain.   4. Take Cipro one hour prior to removal of your stent.   ACTIVITY:  1. No strenuous activity x 1week  2. No driving while on narcotic pain medications  3. Drink plenty of water  4. Continue to walk at home - you can still get blood clots when you are at home, so keep active, but don't over do it.  5. May return to work/school tomorrow or when you feel ready   BATHING:  1. You can shower and we recommend daily showers  2. You have a string coming from your urethra: The stent string is attached to your ureteral stent. Do not pull on this.   SIGNS/SYMPTOMS TO CALL:  Please call us if you have a fever greater than 101.5, uncontrolled nausea/vomiting, uncontrolled pain, dizziness, unable to urinate, bloody urine, chest pain, shortness of breath, leg swelling, leg pain, redness around wound, drainage from wound, or any other concerns or questions.   You can reach us at [phone].   FOLLOW-UP:  1. You have an appointment in 6 weeks with a ultrasound of your kidneys prior.  2. You have a string attached to your stent,  you may remove it on Wednesday, Feb 23rd . To do this, pull the strings until the stents are completely removed. You may feel an odd sensation in your back. 

## 2020-10-15 NOTE — Anesthesia Preprocedure Evaluation (Signed)
Anesthesia Evaluation  Patient identified by MRN, date of birth, ID band Patient awake    Reviewed: Allergy & Precautions, H&P , NPO status , Patient's Chart, lab work & pertinent test results  Airway Mallampati: II  TM Distance: >3 FB Neck ROM: Full    Dental no notable dental hx.    Pulmonary neg pulmonary ROS,    Pulmonary exam normal breath sounds clear to auscultation       Cardiovascular hypertension, Normal cardiovascular exam Rhythm:Regular Rate:Normal     Neuro/Psych negative neurological ROS  negative psych ROS   GI/Hepatic negative GI ROS, Neg liver ROS,   Endo/Other  negative endocrine ROS  Renal/GU Renal disease  negative genitourinary   Musculoskeletal negative musculoskeletal ROS (+)   Abdominal   Peds negative pediatric ROS (+)  Hematology negative hematology ROS (+)   Anesthesia Other Findings   Reproductive/Obstetrics negative OB ROS                             Anesthesia Physical Anesthesia Plan  ASA: II  Anesthesia Plan: General   Post-op Pain Management:    Induction: Intravenous  PONV Risk Score and Plan: 3 and Ondansetron, Dexamethasone, Midazolam and Treatment may vary due to age or medical condition  Airway Management Planned: LMA  Additional Equipment:   Intra-op Plan:   Post-operative Plan: Extubation in OR  Informed Consent: I have reviewed the patients History and Physical, chart, labs and discussed the procedure including the risks, benefits and alternatives for the proposed anesthesia with the patient or authorized representative who has indicated his/her understanding and acceptance.     Dental advisory given  Plan Discussed with: CRNA and Surgeon  Anesthesia Plan Comments:         Anesthesia Quick Evaluation

## 2020-10-15 NOTE — Anesthesia Procedure Notes (Signed)
Procedure Name: LMA Insertion Date/Time: 10/15/2020 1:24 PM Performed by: Basilio Cairo, CRNA Pre-anesthesia Checklist: Patient identified, Patient being monitored, Timeout performed, Emergency Drugs available and Suction available Patient Re-evaluated:Patient Re-evaluated prior to induction Oxygen Delivery Method: Circle system utilized Preoxygenation: Pre-oxygenation with 100% oxygen Induction Type: IV induction Ventilation: Mask ventilation without difficulty LMA: LMA inserted LMA Size: 4.0 Tube type: Oral Number of attempts: 1 Placement Confirmation: positive ETCO2 and breath sounds checked- equal and bilateral Tube secured with: Tape Dental Injury: Teeth and Oropharynx as per pre-operative assessment

## 2020-10-15 NOTE — Anesthesia Postprocedure Evaluation (Signed)
Anesthesia Post Note  Patient: Cynthia Roach  Procedure(s) Performed: CYSTOSCOPY WITH RETROGRADE PYELOGRAM, URETEROSCOPY AND STENT PLACEMENT laser lithotrispsy and basket extraction (Left Ureter)     Patient location during evaluation: PACU Anesthesia Type: General Level of consciousness: awake and alert Pain management: pain level controlled Vital Signs Assessment: post-procedure vital signs reviewed and stable Respiratory status: spontaneous breathing, nonlabored ventilation, respiratory function stable and patient connected to nasal cannula oxygen Cardiovascular status: blood pressure returned to baseline and stable Postop Assessment: no apparent nausea or vomiting Anesthetic complications: no   No complications documented.  Last Vitals:  Vitals:   10/15/20 1200 10/15/20 1400  BP: (!) 172/107   Pulse: 91   Resp: 18   Temp:  36.9 C  SpO2: 98%     Last Pain:  Vitals:   10/15/20 1049  TempSrc: Oral  PainSc:                  [name] S

## 2020-10-15 NOTE — ED Triage Notes (Signed)
Reports left side pain since falling on a dog gate on Sunday.  Fall due to vertigo.  Reports she has had vertigo on and off since having covid in December.  Also endorses some nausea.

## 2020-10-16 ENCOUNTER — Encounter (HOSPITAL_COMMUNITY): Payer: Self-pay | Admitting: Urology

## 2020-11-20 ENCOUNTER — Ambulatory Visit: Payer: 59 | Admitting: Family Medicine

## 2020-11-20 ENCOUNTER — Encounter: Payer: Self-pay | Admitting: Family Medicine

## 2020-11-20 VITALS — BP 142/86 | HR 79 | Temp 98.3°F | Wt 171.0 lb

## 2020-11-20 DIAGNOSIS — R251 Tremor, unspecified: Secondary | ICD-10-CM

## 2020-11-20 DIAGNOSIS — I10 Essential (primary) hypertension: Secondary | ICD-10-CM

## 2020-11-20 DIAGNOSIS — F419 Anxiety disorder, unspecified: Secondary | ICD-10-CM

## 2020-11-20 DIAGNOSIS — R2990 Unspecified symptoms and signs involving the nervous system: Secondary | ICD-10-CM | POA: Diagnosis not present

## 2020-11-20 DIAGNOSIS — U099 Post covid-19 condition, unspecified: Secondary | ICD-10-CM

## 2020-11-20 DIAGNOSIS — R002 Palpitations: Secondary | ICD-10-CM | POA: Diagnosis not present

## 2020-11-20 DIAGNOSIS — R413 Other amnesia: Secondary | ICD-10-CM | POA: Insufficient documentation

## 2020-11-20 MED ORDER — ESCITALOPRAM OXALATE 10 MG PO TABS: 10.0000 mg | ORAL_TABLET | Freq: Every day | ORAL | 5 refills | Status: DC

## 2020-11-20 MED ORDER — ZOLPIDEM TARTRATE 10 MG PO TABS: 10.0000 mg | ORAL_TABLET | Freq: Every evening | ORAL | 1 refills | Status: DC | PRN

## 2020-11-20 MED ORDER — METOPROLOL SUCCINATE ER 50 MG PO TB24: 50.0000 mg | ORAL_TABLET | Freq: Every day | ORAL | 3 refills | Status: DC

## 2020-11-20 MED ORDER — AMPHETAMINE-DEXTROAMPHET ER 10 MG PO CP24: 10.0000 mg | ORAL_CAPSULE | Freq: Every day | ORAL | 0 refills | Status: DC

## 2020-11-20 NOTE — Progress Notes (Signed)
   Subjective:    Patient ID: Cynthia Roach, female    DOB: 1970/12/01, 50 y.o.   MRN: [id number]  HPI Here to follow up on a large number of complaints that we feel are "long haul" symptoms from her Covid-19 infection. Today we focused on a few of these. She has been using Zolpidem for sleep, and this has been very helpful. We agreed to continue this. She has had a lot of anxiety which causes her to worry about things and obsess about things. She feels her heart pounding and racing, and her hands begin to shake. She shows me a video her husband took of her hands during one of these episodes, and they have a resting tremor. Her BP had been running high for about 6 months even before she got Covid, but now it is consistently running 140-160 over 90-100 at home. She also complains about poor memory and an inability to focus on things. She describes typical ADHD type symptoms, though she has never had these before the Covid infection. She has started driving a little now, but only for short distances.    Review of Systems  Constitutional: Negative.   Respiratory: Negative.   Cardiovascular: Positive for palpitations. Negative for chest pain and leg swelling.  Gastrointestinal: Negative.   Genitourinary: Negative.   Neurological: Positive for tremors. Negative for dizziness, seizures, syncope, facial asymmetry, speech difficulty, weakness, light-headedness, numbness and headaches.  Psychiatric/Behavioral: Positive for decreased concentration, dysphoric mood and sleep disturbance. Negative for agitation, behavioral problems, confusion, hallucinations, self-injury and suicidal ideas. The patient is nervous/anxious.        Objective:   Physical Exam Constitutional:      Appearance: Normal appearance.  Cardiovascular:     Rate and Rhythm: Normal rate and regular rhythm.     Pulses: Normal pulses.     Heart sounds: Normal heart sounds.  Pulmonary:     Effort: Pulmonary effort is normal.      Breath sounds: Normal breath sounds.  Neurological:     General: No focal deficit present.     Mental Status: She is alert and oriented to person, place, and time.     Comments: No tremors today   Psychiatric:        Behavior: Behavior normal.        Thought Content: Thought content normal.        Judgment: Judgment normal.     Comments: Anxious and tearful            Assessment & Plan:  She is having a lot of long haul Covid symptoms. For the insomnia, she will stay on Zolpidem 10 mg qhs. For the anxiety, she will try Lexapro 10 mg daily. To help with her focus and memory, she will try Adderall XR 10 mg each morning. For the HTN we will add Metoprolol succinate 50 mg daily to her Losartan HCT. Hopefuly this will help the tremors as well. Recheck all this in 2 weeks. We have referred her to [facility] also, and she is waiting to hear from them.  Gershon Crane, MD

## 2020-11-24 ENCOUNTER — Ambulatory Visit (INDEPENDENT_AMBULATORY_CARE_PROVIDER_SITE_OTHER): Payer: 59 | Admitting: Nurse Practitioner

## 2020-11-24 VITALS — BP 126/77 | HR 75 | Temp 97.5°F | Resp 18

## 2020-11-24 DIAGNOSIS — R251 Tremor, unspecified: Secondary | ICD-10-CM

## 2020-11-24 DIAGNOSIS — F419 Anxiety disorder, unspecified: Secondary | ICD-10-CM

## 2020-11-24 DIAGNOSIS — R42 Dizziness and giddiness: Secondary | ICD-10-CM

## 2020-11-24 DIAGNOSIS — U071 COVID-19: Secondary | ICD-10-CM | POA: Diagnosis not present

## 2020-11-24 DIAGNOSIS — F8081 Childhood onset fluency disorder: Secondary | ICD-10-CM

## 2020-11-24 DIAGNOSIS — R0602 Shortness of breath: Secondary | ICD-10-CM | POA: Diagnosis not present

## 2020-11-24 DIAGNOSIS — R5381 Other malaise: Secondary | ICD-10-CM

## 2020-11-24 DIAGNOSIS — G500 Trigeminal neuralgia: Secondary | ICD-10-CM

## 2020-11-24 DIAGNOSIS — E559 Vitamin D deficiency, unspecified: Secondary | ICD-10-CM

## 2020-11-24 DIAGNOSIS — R059 Cough, unspecified: Secondary | ICD-10-CM | POA: Diagnosis not present

## 2020-11-24 DIAGNOSIS — R413 Other amnesia: Secondary | ICD-10-CM

## 2020-11-24 DIAGNOSIS — G4700 Insomnia, unspecified: Secondary | ICD-10-CM

## 2020-11-24 DIAGNOSIS — Z8616 Personal history of COVID-19: Secondary | ICD-10-CM

## 2020-11-24 DIAGNOSIS — J1282 Pneumonia due to coronavirus disease 2019: Secondary | ICD-10-CM

## 2020-11-24 MED ORDER — OMEPRAZOLE 20 MG PO CPDR: 20.0000 mg | DELAYED_RELEASE_CAPSULE | Freq: Every day | ORAL | 3 refills | Status: DC

## 2020-11-24 MED ORDER — ALBUTEROL SULFATE HFA 108 (90 BASE) MCG/ACT IN AERS: 2.0000 | INHALATION_SPRAY | Freq: Four times a day (QID) | RESPIRATORY_TRACT | 0 refills | Status: DC | PRN

## 2020-11-24 MED ORDER — MONTELUKAST SODIUM 10 MG PO TABS: 10.0000 mg | ORAL_TABLET | Freq: Every day | ORAL | 3 refills | Status: DC

## 2020-11-24 NOTE — Progress Notes (Signed)
@Patient  ID: , female    DOB: 1970/09/03, 49 y.o.   MRN: [id number]  Chief Complaint  Patient presents with  . history of covid    Multiple long covid symptoms: vertigo, memory loss, tremors. stutter    Referring provider: [id number], MD  HPI  Patient presents today for post COVID care clinic visit.  Patient has a positive for COVID in December 2021.  She continues to have ongoing issues.  She complains today of ongoing issues with vertigo, memory loss, tremors, stutter, anxiety, fatigue.  Patient also complains of shortness of breath with exertion.  Patient did have a recent CT of her abdomen on 10/15/2020.  She was seen in the ED that day for possible kidney stone.  The CT scan did show hazy densities at the lung bases due to ongoing sequelae of Covid.  We will place a referral to pulmonary for ongoing shortness of breath and abnormal CT scan. Denies f/c/s, n/v/d, hemoptysis, PND, chest pain or edema.        Allergies  Allergen Reactions  . Oxycodone Hives and Anaphylaxis    Had hives and shortness of breath Hives Other reaction(s): Hives Had hives and shortness of breath  . Shellfish Allergy Anaphylaxis and Hives  . Amoxicillin Other (See Comments)    Burns when urinating Burns when urinating  . Lisinopril Cough  . Morphine And Related     SOB, rash & hives    Immunization History  Administered Date(s) Administered  . Influenza Split 06/28/2013  . PFIZER(Purple Top)SARS-COV-2 Vaccination 02/26/2020, 05/16/2020    Past Medical History:  Diagnosis Date  . Chronic kidney disease    left ureteral stone  . Hypertension   . Ovarian cyst   . Seasonal allergies   . Trigeminal neuralgia     Tobacco History: Social History   Tobacco Use  Smoking Status Never Smoker  Smokeless Tobacco Never Used   Counseling given: Yes   Facility-Administered Encounter Medications as of 11/24/2020  Medication  . belladonna-opium (B&O) suppository 16.2-60mg    . fentaNYL (SUBLIMAZE) injection 25-50 mcg  . iohexol (OMNIPAQUE) 300 MG/ML solution  . lidocaine (XYLOCAINE) 2 % jelly  . sodium chloride irrigation 0.9 %   Outpatient Encounter Medications as of 11/24/2020  Medication Sig  . albuterol (VENTOLIN HFA) 108 (90 Base) MCG/ACT inhaler Inhale 2 puffs into the lungs every 6 (six) hours as needed for wheezing or shortness of breath.  . montelukast (SINGULAIR) 10 MG tablet Take 1 tablet (10 mg total) by mouth at bedtime.  01/24/2021 omeprazole (PRILOSEC) 20 MG capsule Take 1 capsule (20 mg total) by mouth daily.  Marland Kitchen amLODipine (NORVASC) 10 MG tablet Take 1 tablet (10 mg total) by mouth daily.  Marland Kitchen amphetamine-dextroamphetamine (ADDERALL XR) 10 MG 24 hr capsule Take 1 capsule (10 mg total) by mouth daily.  . carbamazepine (CARBATROL) 300 MG 12 hr capsule Take 1 capsule (300 mg total) by mouth 2 (two) times daily.  Marland Kitchen EPINEPHrine (EPIPEN 2-PAK) 0.3 mg/0.3 mL IJ SOAJ injection Inject 0.3 mLs (0.3 mg total) into the muscle once. (Patient taking differently: Inject 0.3 mg into the muscle daily as needed for anaphylaxis.)  . escitalopram (LEXAPRO) 10 MG tablet Take 1 tablet (10 mg total) by mouth daily.  Marland Kitchen gabapentin (NEURONTIN) 300 MG capsule Take 300 mg by mouth 3 (three) times daily.  Marland Kitchen latanoprost (XALATAN) 0.005 % ophthalmic solution Place 1 drop into both eyes at bedtime.  Marland Kitchen losartan-hydrochlorothiazide (HYZAAR) 100-25 MG tablet Take 1  tablet by mouth daily.  . metoprolol succinate (TOPROL-XL) 50 MG 24 hr tablet Take 1 tablet (50 mg total) by mouth daily. Take with or immediately following a meal.  . phenazopyridine (PYRIDIUM) 200 MG tablet Take 1 tablet (200 mg total) by mouth 3 (three) times daily as needed for pain.  . traMADol (ULTRAM) 50 MG tablet Take 1-2 tablets (50-100 mg total) by mouth every 6 (six) hours as needed for moderate pain.  Marland Kitchen tretinoin (RETIN-A) 0.05 % cream Apply 1 application topically at bedtime.  Marland Kitchen zolpidem (AMBIEN) 10 MG tablet Take 1  tablet (10 mg total) by mouth at bedtime as needed for sleep.     Review of Systems  Review of Systems  Constitutional: Positive for fatigue. Negative for fever.  HENT: Negative.   Respiratory: Positive for cough and shortness of breath.   Cardiovascular: Negative.  Negative for chest pain, palpitations and leg swelling.  Gastrointestinal: Negative.   Allergic/Immunologic: Negative.   Neurological: Positive for dizziness and tremors.       Stutter  Psychiatric/Behavioral: The patient is nervous/anxious.        Physical Exam  BP 126/77   Pulse 75   Temp (!) 97.5 F (36.4 C)   Resp 18   SpO2 100%   Wt Readings from Last 5 Encounters:  10/15/20 162 lb (73.5 kg)  11/20/20 171 lb (77.6 kg)  09/27/20 173 lb 9.6 oz (78.7 kg)  09/01/20 169 lb 9.6 oz (76.9 kg)  08/22/20 165 lb (74.8 kg)     Physical Exam Vitals and nursing note reviewed.  Constitutional:      General: She is not in acute distress.    Appearance: She is well-developed.  Cardiovascular:     Rate and Rhythm: Normal rate and regular rhythm.  Pulmonary:     Effort: Pulmonary effort is normal.     Breath sounds: Normal breath sounds.  Musculoskeletal:     Right lower leg: No edema.     Left lower leg: No edema.  Neurological:     Mental Status: She is alert and oriented to person, place, and time.  Psychiatric:        Mood and Affect: Mood normal.        Behavior: Behavior normal.      Lab Results:  CBC    Component Value Date/Time   WBC 14.4 (H) 10/15/2020 0209   RBC 3.53 (L) 10/15/2020 0209   HGB 11.2 (L) 10/15/2020 0209   HCT 33.4 (L) 10/15/2020 0209   PLT 398 10/15/2020 0209   MCV 94.6 10/15/2020 0209   MCH 31.7 10/15/2020 0209   MCHC 33.5 10/15/2020 0209   RDW 13.6 10/15/2020 0209   LYMPHSABS 1.1 09/01/2020 1102   MONOABS 0.7 09/01/2020 1102   EOSABS 0.1 09/01/2020 1102   BASOSABS 0.0 09/01/2020 1102    BMET    Component Value Date/Time   NA 139 10/15/2020 0209   K 3.5  10/15/2020 0209   CL 105 10/15/2020 0209   CO2 26 10/15/2020 0209   GLUCOSE 133 (H) 10/15/2020 0209   BUN 19 10/15/2020 0209   CREATININE 1.35 (H) 10/15/2020 0209   CALCIUM 9.1 10/15/2020 0209   GFRNONAA 48 (L) 10/15/2020 0209   GFRAA >60 09/15/2016 2005    BNP No results found for: BNP  ProBNP No results found for: PROBNP  Imaging: No results found.   Assessment & Plan:   History of COVID-19 Cough Shortness of breath:   Stay well hydrated  Stay  active  Deep breathing exercises  May take tylenol for fever or pain  Will place referral to pulmonary -  abnormal CT  Will order albuterol  Will order prednisone  Will order Singulair  Will order prilosec   Anxiety:  Will place referral to psychiatry  Continue Lexapro as prescribed by PCP   Vertigo Trigeminal neuralgia Memory loss Tremors Stutter Insomnia Physical deconditioning Joint pain:  Will place referral to PT - vertigo and deconditioning / tremors, joint pain  Will place referral to Speech - cognitive rehabilitation / stutter   Will place referral to neurology  Will order labs   Follow up:  Follow up in 2 months or sooner if needed      Ivonne Andrew, NP 11/27/2020

## 2020-11-24 NOTE — Patient Instructions (Addendum)
Covid 19 Cough Shortness of breath:   Stay well hydrated  Stay active  Deep breathing exercises  May take tylenol for fever or pain  Will place referral to pulmonary -  abnormal CT  Will order albuterol  Will order prednisone  Will order Singulair  Will order prilosec   Anxiety:  Will place referral to psychiatry  Continue Lexapro as prescribed by PCP   Vertigo Trigeminal neuralgia Memory loss Tremors Stutter Insomnia Physical deconditioning Joint pain:  Will place referral to PT - vertigo and deconditioning / tremors, joint pain  Will place referral to Speech - cognitive rehabilitation / stutter   Will place referral to neurology  Will order labs   Follow up:  Follow up in 2 months or sooner if needed

## 2020-11-27 DIAGNOSIS — R059 Cough, unspecified: Secondary | ICD-10-CM | POA: Insufficient documentation

## 2020-11-27 DIAGNOSIS — G4700 Insomnia, unspecified: Secondary | ICD-10-CM | POA: Insufficient documentation

## 2020-11-27 DIAGNOSIS — R42 Dizziness and giddiness: Secondary | ICD-10-CM | POA: Insufficient documentation

## 2020-11-27 DIAGNOSIS — Z8616 Personal history of COVID-19: Secondary | ICD-10-CM | POA: Insufficient documentation

## 2020-11-27 DIAGNOSIS — F8081 Childhood onset fluency disorder: Secondary | ICD-10-CM | POA: Insufficient documentation

## 2020-11-27 DIAGNOSIS — R0602 Shortness of breath: Secondary | ICD-10-CM | POA: Insufficient documentation

## 2020-11-27 DIAGNOSIS — F419 Anxiety disorder, unspecified: Secondary | ICD-10-CM | POA: Insufficient documentation

## 2020-11-27 DIAGNOSIS — E559 Vitamin D deficiency, unspecified: Secondary | ICD-10-CM | POA: Insufficient documentation

## 2020-11-27 DIAGNOSIS — R251 Tremor, unspecified: Secondary | ICD-10-CM | POA: Insufficient documentation

## 2020-11-27 DIAGNOSIS — R5381 Other malaise: Secondary | ICD-10-CM | POA: Insufficient documentation

## 2020-11-27 MED ORDER — PREDNISONE 10 MG PO TABS: ORAL_TABLET | ORAL | 0 refills | Status: DC

## 2020-11-27 NOTE — Assessment & Plan Note (Signed)
Cough Shortness of breath:   Stay well hydrated  Stay active  Deep breathing exercises  May take tylenol for fever or pain  Will place referral to pulmonary -  abnormal CT  Will order albuterol  Will order prednisone  Will order Singulair  Will order prilosec   Anxiety:  Will place referral to psychiatry  Continue Lexapro as prescribed by PCP   Vertigo Trigeminal neuralgia Memory loss Tremors Stutter Insomnia Physical deconditioning Joint pain:  Will place referral to PT - vertigo and deconditioning / tremors, joint pain  Will place referral to Speech - cognitive rehabilitation / stutter   Will place referral to neurology  Will order labs   Follow up:  Follow up in 2 months or sooner if needed

## 2020-11-27 NOTE — Addendum Note (Signed)
Addended by: Ivonne Andrew on: 11/27/2020 03:28 PM   Modules accepted: Orders

## 2020-11-30 ENCOUNTER — Other Ambulatory Visit: Payer: Self-pay | Admitting: Nurse Practitioner

## 2020-11-30 MED ORDER — OMEPRAZOLE MAGNESIUM 20 MG PO TBEC: 20.0000 mg | DELAYED_RELEASE_TABLET | Freq: Every day | ORAL | 2 refills | Status: AC

## 2020-12-04 ENCOUNTER — Other Ambulatory Visit: Payer: Self-pay

## 2020-12-04 ENCOUNTER — Ambulatory Visit: Payer: 59 | Admitting: Family Medicine

## 2020-12-04 ENCOUNTER — Encounter: Payer: Self-pay | Admitting: Family Medicine

## 2020-12-04 VITALS — BP 136/88 | HR 65 | Temp 98.6°F | Wt 166.8 lb

## 2020-12-04 DIAGNOSIS — F419 Anxiety disorder, unspecified: Secondary | ICD-10-CM | POA: Diagnosis not present

## 2020-12-04 DIAGNOSIS — I10 Essential (primary) hypertension: Secondary | ICD-10-CM

## 2020-12-04 DIAGNOSIS — R413 Other amnesia: Secondary | ICD-10-CM

## 2020-12-04 DIAGNOSIS — R2990 Unspecified symptoms and signs involving the nervous system: Secondary | ICD-10-CM

## 2020-12-04 DIAGNOSIS — U099 Post covid-19 condition, unspecified: Secondary | ICD-10-CM

## 2020-12-04 DIAGNOSIS — R251 Tremor, unspecified: Secondary | ICD-10-CM

## 2020-12-04 NOTE — Progress Notes (Signed)
   Subjective:    Patient ID: Cardell Peach, female    DOB: October 11, 1970, 50 y.o.   MRN: [id number]  HPI Here with her husband to follow up on long haul Covid symptoms. We saw her 2 weeks ago, and we started her on Adderall XR 10 mg daily for poor concentration, on Lexapro 10 mg daily for depression and anxiety, and on Metoprolol succinate 50 mg daily for HTN and tremors. She says her moods have improved slightly and her focus has improved slightly. Her BP has stabilized nicely, and the tremors have resolved. She was seen in the [facility] on 11-24-20, and they told her to stay on her current medications. They did referrals to Pulmonary, to Speech Therapy, to PT, and to Psychiatry. She is already scheduled to see Dr. Levert Feinstein in Neurology on 02-15-21.    Review of Systems  Constitutional: Positive for fatigue.  Respiratory: Positive for shortness of breath. Negative for cough and wheezing.   Cardiovascular: Negative.   Gastrointestinal: Negative.   Genitourinary: Negative.   Neurological: Negative.   Psychiatric/Behavioral: Positive for dysphoric mood. The patient is nervous/anxious.        Objective:   Physical Exam Constitutional:      Appearance: Normal appearance.  Cardiovascular:     Rate and Rhythm: Normal rate and regular rhythm.     Pulses: Normal pulses.     Heart sounds: Normal heart sounds.  Pulmonary:     Effort: Pulmonary effort is normal.     Breath sounds: Normal breath sounds.  Neurological:     General: No focal deficit present.     Mental Status: She is alert and oriented to person, place, and time. Mental status is at baseline.  Psychiatric:        Mood and Affect: Mood normal.        Behavior: Behavior normal.        Thought Content: Thought content normal.        Judgment: Judgment normal.           Assessment & Plan:  She has improved slightly but still has a long way to go to recover from her Covid infection. She will see the  specialists mentioned above. Her HTN and tremors have improved. Her moods and focus have improved somewhat.  Gershon Crane, MD

## 2020-12-05 ENCOUNTER — Ambulatory Visit: Payer: 59 | Attending: Nurse Practitioner | Admitting: Speech Pathology

## 2020-12-05 ENCOUNTER — Encounter: Payer: Self-pay | Admitting: Speech Pathology

## 2020-12-05 DIAGNOSIS — R41841 Cognitive communication deficit: Secondary | ICD-10-CM | POA: Insufficient documentation

## 2020-12-05 DIAGNOSIS — R252 Cramp and spasm: Secondary | ICD-10-CM | POA: Diagnosis present

## 2020-12-05 DIAGNOSIS — M6281 Muscle weakness (generalized): Secondary | ICD-10-CM | POA: Diagnosis present

## 2020-12-05 DIAGNOSIS — R296 Repeated falls: Secondary | ICD-10-CM | POA: Diagnosis present

## 2020-12-05 DIAGNOSIS — M79609 Pain in unspecified limb: Secondary | ICD-10-CM | POA: Insufficient documentation

## 2020-12-05 DIAGNOSIS — R42 Dizziness and giddiness: Secondary | ICD-10-CM | POA: Diagnosis present

## 2020-12-05 DIAGNOSIS — R2681 Unsteadiness on feet: Secondary | ICD-10-CM | POA: Diagnosis present

## 2020-12-05 NOTE — Therapy (Signed)
[facility] [address] [place], [place], [address] Phone: [phone]   Fax:  [phone]  Speech Language Pathology Evaluation  Patient Details  Name: Cynthia Roach MRN: [id number] Date of Birth: 1970-09-13 Referring Provider (SLP): Angus Seller NP   Encounter Date: 12/05/2020   End of Session - 12/05/20 1538    Visit Number 1    Number of Visits 17    Date for SLP Re-Evaluation 02/04/21    SLP Start Time 1318    SLP Stop Time  1400    SLP Time Calculation (min) 42 min    Activity Tolerance Patient tolerated treatment well           Past Medical History:  Diagnosis Date  . Chronic kidney disease    left ureteral stone  . Hypertension   . Ovarian cyst   . Seasonal allergies   . Trigeminal neuralgia     Past Surgical History:  Procedure Laterality Date  . CESAREAN SECTION    . CYSTOSCOPY WITH RETROGRADE PYELOGRAM, URETEROSCOPY AND STENT PLACEMENT Left 10/15/2020   Procedure: CYSTOSCOPY WITH RETROGRADE PYELOGRAM, URETEROSCOPY AND STENT PLACEMENT laser lithotrispsy and basket extraction;  Surgeon: Crist Fat, MD;  Location: [facility];  Service: Urology;  Laterality: Left;    There were no vitals filed for this visit.   Subjective Assessment - 12/05/20 1537    Subjective " am having trouble with everything. I can't remember anything!"    Patient is accompained by: Family member   [name] - husband   Currently in Pain? No/denies              SLP Evaluation [facility] - 12/05/20 1337      SLP Visit Information   SLP Received On 12/05/20    Referring Provider (SLP) Angus Seller NP    Onset Date 08/07/20    Medical Diagnosis Post Covid Long Hauler      General Information   HPI Patient has a positive for COVID in December 2021.  She continues to have ongoing issues with vertigo, memory loss, tremors, stutter, anxiety, fatigue.  The CT scan did show hazy densities at the lung bases due to ongoing  sequelae of Covid.      Balance Screen   Has the patient fallen in the past 6 months Yes    How many times? 3   Reports three falls where she hit her head. SLP to report to doc. Pt does not report any new changes.   Has the patient had a decrease in activity level because of a fear of falling?  No    Is the patient reluctant to leave their home because of a fear of falling?  No   Not if someone is with her     Prior Functional Status   Cognitive/Linguistic Baseline Within functional limits    Type of Home House     Lives With Spouse;Son    Available Support Family    Vocation Unemployed      Cognition   Overall Cognitive Status Impaired/Different from baseline    Area of Impairment Attention;Memory;Orientation    Attention Focused;Sustained;Selective;Alternating;Divided    Memory Impaired    Memory Impairment Decreased recall of new information;Decreased long term memory;Decreased short term memory      Standardized Assessments   Standardized Assessments  Other Assessment    Other Assessment SLUMS; Multifactorial Memory Questionnairre; Communicative Effectiveness Survey  SLP Education - 12/05/20 1537    Education Details Provided edu on brain fog and fatigue exacerbating cognitive impairment. Spoke with patient about POC for memory, attention, and word finding strategies.    Person(s) Educated Patient;Spouse    Methods Explanation;Demonstration    Comprehension Verbalized understanding;Need further instruction            SLP Short Term Goals - 12/05/20 1554      SLP SHORT TERM GOAL #1   Title Pt will identify 3 memory strategies to assist with recalling information.    Time 4    Period Weeks    Status New      SLP SHORT TERM GOAL #2   Title Pt will identify 3 attention strategies to assist with reducing cognitive fatigue.    Time 4    Period Weeks    Status New      SLP SHORT TERM GOAL #3   Title Further assessment of  language skills warranted.    Time 1    Period Weeks    Status New            SLP Long Term Goals - 12/05/20 1555      SLP LONG TERM GOAL #1   Title Patient will demonstrate use of memory strategies to schedule activities, recall weekly events and items to maintain safety to participate socially in functional living environment    Time 8    Period Weeks    Status New      SLP LONG TERM GOAL #2   Title Patient will develop functional attention skills to effectively attend to and communicate in tasks of daily living in their functional living environment.    Time 8    Period Weeks    Status New            Plan - 12/05/20 1540    Clinical Impression Statement Pt is a 50 yo female w/ hx positive for Covid 19 diagnosis. Pt reports difficulty with fatigue, memory loss, poor concentration and attention, and an occasional stutter when fatigued or emotional. Informal assessment of conversation revealed perceived delayed processing of information. Pt referred to husband to answer several questions because she reports she cannot remember the details. She reports s/sx of long-term memory loss (not remembering her husband's brother, not remembering any details about her wedding, not recalling her field of study etc). 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 SLP gave the SLUMS to determine further need for assessment. Pt scored a 27/30 which is WFL. Her areas of deficit were determined to be memory; however, pt required repetition of questions and extra time to complete tasks. SLP had patient complete a PROM - Multifactorial Memory Questionnaire - and pt scored a 5.994 idicating a T-score of "Very Low" for satisfaction, ability, and strategy. SLP rec skilled speech services to address cognitive-communication impairment post-COVID to increase patient's quality of life and safety with ADLs at home. Further assessment of language  skills warranted.    Speech Therapy Frequency 2x / week    Duration 8 weeks    Treatment/Interventions Compensatory strategies;Cueing hierarchy;Functional tasks;Patient/family education;Environmental controls;Cognitive reorganization;Multimodal communcation approach;Language facilitation;Compensatory techniques;Internal/external aids;SLP instruction and feedback    Potential Considerations Ability to learn/carryover information    Consulted and Agree with Plan of Care Patient;Family member/caregiver    Family Member Consulted [name]- Husband           Patient will benefit from  skilled therapeutic intervention in order to improve the following deficits and impairments:   Cognitive communication deficit    Problem List Patient Active Problem List   Diagnosis Date Noted  . Shortness of breath 11/27/2020  . Cough 11/27/2020  . Anxiety 11/27/2020  . History of COVID-19 11/27/2020  . Stutter 11/27/2020  . Tremor 11/27/2020  . Insomnia 11/27/2020  . Vertigo 11/27/2020  . Physical deconditioning 11/27/2020  . Vitamin D deficiency 11/27/2020  . Tremors of nervous system 11/20/2020  . Anxiety disorder 11/20/2020  . Memory loss 11/20/2020  . 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 Pneumonia due to COVID-19 virus 08/07/2020  . Foot sprain, left, subsequent encounter 05/16/2020  . Menopause 10/05/2019  . Palpitations 10/05/2019  . SOB (shortness of breath) 10/05/2019  . Cellulitis of face 08/28/2018  . CKD (chronic kidney disease), stage II 09/18/2016  . Gestational diabetes 05/18/2014  . Trigeminal neuralgia 05/11/2013  . GOITER 02/10/2009  . Essential hypertension 04/29/2007  . HEADACHE 04/29/2007    Dorena Bodo MS, [place], CBIS  12/05/2020, 3:57 PM  [facility] [address] [place], [place], [address] Phone: [phone]   Fax:  [phone]  Name: Cynthia Roach MRN:  [id number] Date of Birth: 1970/11/22

## 2020-12-07 ENCOUNTER — Ambulatory Visit: Payer: 59 | Admitting: Speech Pathology

## 2020-12-19 ENCOUNTER — Other Ambulatory Visit: Payer: Self-pay

## 2020-12-19 ENCOUNTER — Other Ambulatory Visit: Payer: Self-pay | Admitting: Nurse Practitioner

## 2020-12-19 ENCOUNTER — Encounter: Payer: Self-pay | Admitting: Family Medicine

## 2020-12-19 DIAGNOSIS — Z8616 Personal history of COVID-19: Secondary | ICD-10-CM

## 2020-12-19 MED ORDER — ALBUTEROL SULFATE HFA 108 (90 BASE) MCG/ACT IN AERS: 2.0000 | INHALATION_SPRAY | Freq: Four times a day (QID) | RESPIRATORY_TRACT | 1 refills | Status: DC | PRN

## 2020-12-22 ENCOUNTER — Other Ambulatory Visit: Payer: Self-pay

## 2020-12-22 ENCOUNTER — Ambulatory Visit: Payer: 59

## 2020-12-22 VITALS — BP 117/83 | HR 65

## 2020-12-22 DIAGNOSIS — M79609 Pain in unspecified limb: Secondary | ICD-10-CM

## 2020-12-22 DIAGNOSIS — R41841 Cognitive communication deficit: Secondary | ICD-10-CM | POA: Diagnosis not present

## 2020-12-22 DIAGNOSIS — R252 Cramp and spasm: Secondary | ICD-10-CM

## 2020-12-22 DIAGNOSIS — M6281 Muscle weakness (generalized): Secondary | ICD-10-CM

## 2020-12-22 DIAGNOSIS — R2681 Unsteadiness on feet: Secondary | ICD-10-CM

## 2020-12-22 DIAGNOSIS — R296 Repeated falls: Secondary | ICD-10-CM

## 2020-12-22 DIAGNOSIS — R42 Dizziness and giddiness: Secondary | ICD-10-CM

## 2020-12-22 NOTE — Therapy (Signed)
[facility] [address] [place], [place], [address] Phone: [phone]   Fax:  [phone]  Physical Therapy Evaluation  Patient Details  Name: Cynthia Roach MRN: [id number] Date of Birth: 11/23/1970 Referring Provider (PT): Ivonne AndrewNichols, Tonya S, NP   Encounter Date: 12/22/2020   PT End of Session - 12/22/20 1319    Visit Number 1    Number of Visits 17    Date for PT Re-Evaluation 02/23/21    Authorization Type [employer] - 90 visits combined total    PT Start Time 1100    PT Stop Time 1143    PT Time Calculation (min) 43 min    Activity Tolerance Other (comment);Patient limited by fatigue   Limited by dizziness, nausea   Behavior During Therapy [facility] for tasks assessed/performed   General slowness of speech and movement, cautious with movement d/t baseline symptoms          Past Medical History:  Diagnosis Date  . Chronic kidney disease    left ureteral stone  . Hypertension   . Ovarian cyst   . Seasonal allergies   . Trigeminal neuralgia     Past Surgical History:  Procedure Laterality Date  . CESAREAN SECTION    . CYSTOSCOPY WITH RETROGRADE PYELOGRAM, URETEROSCOPY AND STENT PLACEMENT Left 10/15/2020   Procedure: CYSTOSCOPY WITH RETROGRADE PYELOGRAM, URETEROSCOPY AND STENT PLACEMENT laser lithotrispsy and basket extraction;  Surgeon: Crist FatHerrick, Benjamin W, MD;  Location: [facility];  Service: Urology;  Laterality: Left;    Vitals:   12/22/20 1103  BP: 117/83  Pulse: 65  SpO2: 96%      Subjective Assessment - 12/22/20 1103    Subjective COVID December 2021, hospitalized for 5 days. Recently evald by ST for residual brain fog and memory issues. Alot of dizziness that comes on randomly (especially when getting up to fast, down stairs - gets better a little when focusing on a point), also gets nausea, tremors in both hands, fatigue and sound sensitivity. Reports she recently noticed no gag reflex while brushing teeth. Has  also had multiple falls and Lists to the right when walking. She is afraid to go out because people see she looks better but not really feeling better and gets embarrassed. Also getting joint pain all over that began post covid, worse in knees today. Has an appointment with a new neurologist soon.    Pertinent History COVID + with hospitalization Dec 2021. had emergency kidney surgery this february, anxiety, trigeminal neuralgia on the RIGHT several years ago and still have episodes (has had 1 since covid). hx of concussions 2x as a child and 2x as an adult.    Patient Stated Goals be able to drive more easily, walk to end of street or around neighborhood with granddaughter, be able to take laundry up or downstairs, not having family worry.    Currently in Pain? Yes    Pain Score 6     Pain Location Knee    Pain Orientation Right;Left    Pain Descriptors / Indicators Aching              [facility] PT Assessment - 12/22/20 0001      Assessment   Medical Diagnosis R06.02 (ICD-10-CM) - Shortness of breath  Z86.16 (ICD-10-CM) - History of COVID-19  R25.1 (ICD-10-CM) - Tremor  R42 (ICD-10-CM) - Vertigo  R53.81 (ICD-10-CM) - Physical deconditioning    Referring Provider (PT) Ivonne AndrewNichols, Tonya S, NP    Onset Date/Surgical Date --  Dec 2021   Hand Dominance Right    Next MD Visit June 1st      Home Environment   Additional Comments Lives at home with husband and 75 yo son. Has a 86 yo son with a grandchild and another 27 yo daughter and husband. House. Stairs to enter and 14 steps + 7 to get to bedroom/bathroom upstairs. Once she comes downstairs with husband she will not go back up when he leaves for work.      Prior Function   Vocation Full time employment    Hydrologist paramedic for work      Cognition   Memory Impaired   cant remember wedding, birth of children or grandchildren     Coordination   Gross Motor Movements are Fluid and Coordinated No    Heel Shin Test shaky,  worse on the right vs left      ROM / Strength   AROM / PROM / Strength Strength      Strength   Overall Strength Comments BLEgrossly 4/5, slight shaky quality and with B knee pain.      Standardized Balance Assessment   Standardized Balance Assessment Berg Balance Test;Dynamic Gait Index (could not test DGI today d/t significant symptom exacerbation)      Berg Balance Test   Sit to Stand Able to stand  independently using hands   kne epain   Standing Unsupported Able to stand safely 2 minutes    Sitting with Back Unsupported but Feet Supported on Floor or Stool Able to sit safely and securely 2 minutes    Stand to Sit Controls descent by using hands    Transfers Able to transfer safely, definite need of hands    Standing Unsupported with Eyes Closed Able to stand 10 seconds safely    Standing Unsupported with Feet Together Able to place feet together independently and stand 1 minute safely    From Standing, Reach Forward with Outstretched Arm Can reach forward >5 cm safely (2")    From Standing Position, Pick up Object from Floor Able to pick up shoe, needs supervision   7/10 nausea   From Standing Position, Turn to Look Behind Over each Shoulder Looks behind one side only/other side shows less weight shift   5/10 nausea/dizzy and head pressure   Turn 360 Degrees Able to turn 360 degrees safely one side only in 4 seconds or less   9/10 dizzy and nausea   Standing Unsupported, Alternately Place Feet on Step/Stool Able to stand independently and complete 8 steps >20 seconds    Standing Unsupported, One Foot in [employer] balance while stepping or standing    Standing on One Leg Unable to try or needs assist to prevent fall    Total Score 39                  Vestibular Assessment - 12/22/20 0001      Vestibular Assessment   General Observation 4/10 dizzy baseline, feels like room is spinning. When worsens feels lightheaded. Head slightly tilted to the left.      Symptom  Behavior   Frequency of Dizziness Everyday      Oculomotor Exam   Smooth Pursuits Intact   6/10 dizzy     Vestibulo-Ocular Reflex   VOR 1 Head Only (x 1 viewing) dizzy after 1 repetition, needed to stop   "feels like I have a concussion"  Objective measurements completed on examination: See above findings.                 PT Short Term Goals - 12/22/20 1145      PT SHORT TERM GOAL #1   Title Independent with initial HEP with reminders as needed to compensate for memory deficits    Time 2    Period Weeks    Status New    Target Date 01/05/21             PT Long Term Goals - 12/22/20 1146      PT LONG TERM GOAL #1   Title Pt will score at least 52/56 on berg balance scale with minimal to no dizziness, nausea, or headache exacerbations to demonstrate decreased falls risk.    Baseline initial 12/22/20: 39/56 with significant symptom exacrbation    Time 8    Period Weeks    Status New    Target Date 02/23/21      PT LONG TERM GOAL #2   Title Pt will achieve score on and improve to [facility] for age without nausea,dizziness to facilitate functional ambulation in the home and community environments.    Time 8    Period Weeks    Status New    Target Date 02/23/21      PT LONG TERM GOAL #3   Title Pt will demonstrate improved BLE strength to at least 4+/5 and improved coordination quality of BLE.    Time 8    Period Weeks    Status New    Target Date 02/23/21      PT LONG TERM GOAL #4   Title Pt will be able to negotiate up/down stairs while holding objects to facilitate independence with laundry in the home.    Time 8    Period Weeks    Target Date 02/23/21      PT LONG TERM GOAL #5   Title Pt will report at least 50% reduction in symptoms    Time 8    Period Weeks    Status New    Target Date 02/23/21                  Plan - 12/22/20 1320    Clinical Impression Statement Pt is 50 yo female who presents with persistent post  COVID symptoms. She got COVID December 2021 and was hospitalized for 5 days. Since this time she has been experiencing brain fog and memory issues (recently evaluated by speech therapy), in addition to dizziness (especially when getting up to fast, down stairs - gets better a little when focusing on a point), nausea, tremors in both hands, fatigue and sound sensitivity that is present at low levels at baseline. She has also experienced multiple joint pain (knees worse today),  decreased balance, multiple falls, and decreased stablity when walking.  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  She presents with diminished coordination, strength and balance as wall as slow, slightly unsteady gait. She will benefit from skilled physcial therapy to work on improving strength, vestibular function, overal endurance, balance and safe functional mobility.    Personal Factors and Comorbidities Comorbidity 3+;Age;Time since onset of injury/illness/exacerbation    Comorbidities insomnia, tremors, long haul post COVID symptoms, hx trigeminal neuralgia right side of face, history of multiple concussions    Examination-Activity Limitations Stairs;Lift;Reach Overhead;Locomotion Level    Examination-Participation Restrictions Shop;Cleaning;Community Activity;Driving;Interpersonal Relationship;Laundry;Occupation    Stability/Clinical Decision Making Unstable/Unpredictable  Clinical Decision Making High    Rehab Potential Fair    PT Frequency 2x / week    PT Duration 8 weeks    PT Treatment/Interventions ADLs/Self Care Home Management;Cryotherapy;Electrical Stimulation;Iontophoresis 4mg /ml Dexamethasone;Moist Heat;Gait training;Functional mobility training;Therapeutic activities;Therapeutic exercise;Balance training;Neuromuscular re-education;Patient/family education;Manual techniques;Dry needling;Energy conservation;Vasopneumatic Device;Vestibular;Visual/perceptual remediation/compensation    PT Next Visit  Plan Provide initial HEP - concise with clear instructions and printout. May need to  treat in a room if gym space is too busy/loud. Further assess and dynamic balance with DGI next visit.  Gradual progression of general strengthening. VOR/habituation vs compensatory techniques as tolerated.    PT Home Exercise Plan Discussed use of visual fixation and manual deep pressure to provide propriocetive input and help calm/decreased symptoms when exacerbated.    Recommended Other Services follow up with neurologist (appointment already scheduled for 02/15/21)    Consulted and Agree with Plan of Care Patient           Patient will benefit from skilled therapeutic intervention in order to improve the following deficits and impairments:  Abnormal gait,Decreased strength,Decreased activity tolerance,Dizziness,Decreased balance,Pain,Decreased coordination,Impaired vision/preception,Decreased endurance  Visit Diagnosis: Muscle weakness (generalized) - Plan: PT plan of care cert/re-cert  Repeated falls - Plan: PT plan of care cert/re-cert  Unsteadiness on feet - Plan: PT plan of care cert/re-cert  Dizziness and giddiness - Plan: PT plan of care cert/re-cert  Pain in extremity, unspecified extremity - Plan: PT plan of care cert/re-cert  Cramp and spasm - Plan: PT plan of care cert/re-cert     Problem List Patient Active Problem List   Diagnosis Date Noted  . Shortness of breath 11/27/2020  . Cough 11/27/2020  . Anxiety 11/27/2020  . History of COVID-19 11/27/2020  . Stutter 11/27/2020  . Tremor 11/27/2020  . Insomnia 11/27/2020  . Vertigo 11/27/2020  . Physical deconditioning 11/27/2020  . Vitamin D deficiency 11/27/2020  . Tremors of nervous system 11/20/2020  . Anxiety disorder 11/20/2020  . Memory loss 11/20/2020  . 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 Pneumonia due to COVID-19 virus 08/07/2020  . Foot sprain, left, subsequent encounter  05/16/2020  . Menopause 10/05/2019  . Palpitations 10/05/2019  . SOB (shortness of breath) 10/05/2019  . Cellulitis of face 08/28/2018  . CKD (chronic kidney disease), stage II 09/18/2016  . Gestational diabetes 05/18/2014  . Trigeminal neuralgia 05/11/2013  . GOITER 02/10/2009  . Essential hypertension 04/29/2007  . HEADACHE 04/29/2007    06/29/2007 , PT, DPT 12/22/2020, 2:04 PM  [facility] [address] [place], [place], [place] Phone: [phone]   Fax:  [phone]  Name: Cynthia Roach MRN: Cynthia Peach Date of Birth: 1970-10-09

## 2020-12-26 ENCOUNTER — Institutional Professional Consult (permissible substitution): Payer: 59 | Admitting: Pulmonary Disease

## 2020-12-27 ENCOUNTER — Ambulatory Visit: Payer: 59 | Attending: Nurse Practitioner | Admitting: Speech Pathology

## 2020-12-27 DIAGNOSIS — R2681 Unsteadiness on feet: Secondary | ICD-10-CM | POA: Insufficient documentation

## 2020-12-27 DIAGNOSIS — M79609 Pain in unspecified limb: Secondary | ICD-10-CM | POA: Insufficient documentation

## 2020-12-27 DIAGNOSIS — R41841 Cognitive communication deficit: Secondary | ICD-10-CM | POA: Insufficient documentation

## 2020-12-27 DIAGNOSIS — R252 Cramp and spasm: Secondary | ICD-10-CM | POA: Insufficient documentation

## 2020-12-27 DIAGNOSIS — R42 Dizziness and giddiness: Secondary | ICD-10-CM | POA: Insufficient documentation

## 2020-12-27 DIAGNOSIS — R296 Repeated falls: Secondary | ICD-10-CM | POA: Insufficient documentation

## 2020-12-27 DIAGNOSIS — M6281 Muscle weakness (generalized): Secondary | ICD-10-CM | POA: Insufficient documentation

## 2020-12-29 ENCOUNTER — Ambulatory Visit: Payer: 59 | Admitting: Speech Pathology

## 2020-12-29 ENCOUNTER — Ambulatory Visit: Payer: 59

## 2021-01-02 ENCOUNTER — Encounter: Payer: Self-pay | Admitting: Rehabilitative and Restorative Service Providers"

## 2021-01-02 ENCOUNTER — Ambulatory Visit: Payer: 59 | Admitting: Speech Pathology

## 2021-01-02 ENCOUNTER — Encounter: Payer: Self-pay | Admitting: Speech Pathology

## 2021-01-02 ENCOUNTER — Other Ambulatory Visit: Payer: Self-pay

## 2021-01-02 ENCOUNTER — Ambulatory Visit: Payer: 59 | Admitting: Rehabilitative and Restorative Service Providers"

## 2021-01-02 DIAGNOSIS — M79609 Pain in unspecified limb: Secondary | ICD-10-CM | POA: Diagnosis present

## 2021-01-02 DIAGNOSIS — R41841 Cognitive communication deficit: Secondary | ICD-10-CM | POA: Diagnosis present

## 2021-01-02 DIAGNOSIS — M6281 Muscle weakness (generalized): Secondary | ICD-10-CM

## 2021-01-02 DIAGNOSIS — R296 Repeated falls: Secondary | ICD-10-CM | POA: Diagnosis not present

## 2021-01-02 DIAGNOSIS — R42 Dizziness and giddiness: Secondary | ICD-10-CM | POA: Diagnosis present

## 2021-01-02 DIAGNOSIS — R252 Cramp and spasm: Secondary | ICD-10-CM | POA: Diagnosis present

## 2021-01-02 DIAGNOSIS — R2681 Unsteadiness on feet: Secondary | ICD-10-CM

## 2021-01-02 NOTE — Therapy (Signed)
[facility] [address] [place], [place], [address] Phone: [phone]   Fax:  [phone]  Speech Language Pathology Treatment  Patient Details  Name: Cynthia Roach MRN: [id number] Date of Birth: 1971-06-29 Referring Provider (SLP): Angus Seller NP   Encounter Date: 01/02/2021   End of Session - 01/02/21 0809    Visit Number 2    Number of Visits 17    Date for SLP Re-Evaluation 02/04/21    SLP Start Time 0805    SLP Stop Time  0843    SLP Time Calculation (min) 38 min    Activity Tolerance Patient tolerated treatment well           Past Medical History:  Diagnosis Date  . Chronic kidney disease    left ureteral stone  . Hypertension   . Ovarian cyst   . Seasonal allergies   . Trigeminal neuralgia     Past Surgical History:  Procedure Laterality Date  . CESAREAN SECTION    . CYSTOSCOPY WITH RETROGRADE PYELOGRAM, URETEROSCOPY AND STENT PLACEMENT Left 10/15/2020   Procedure: CYSTOSCOPY WITH RETROGRADE PYELOGRAM, URETEROSCOPY AND STENT PLACEMENT laser lithotrispsy and basket extraction;  Surgeon: Crist Fat, MD;  Location: [facility];  Service: Urology;  Laterality: Left;    There were no vitals filed for this visit.   Subjective Assessment - 01/02/21 0808    Subjective "I had the fever and chills but passed a kidney stone."    Currently in Pain? No/denies                 ADULT SLP TREATMENT - 01/02/21 0814      General Information   Behavior/Cognition Alert;Cooperative      Treatment Provided   Treatment provided Cognitive-Linquistic      Cognitive-Linquistic Treatment   Treatment focused on Aphasia    Skilled Treatment Pt has c/o word finding. Assessed today using [employer] - Second Ed (BNT-2) re score: 43/60. Pt completed the [employer] - Part A (1:30sec); Part B *Impaired alternating attention*.      Assessment / Recommendations / Plan   Plan Continue with  current plan of care      Progression Toward Goals   Progression toward goals Progressing toward goals              SLP Short Term Goals - 01/02/21 0840      SLP SHORT TERM GOAL #1   Title Pt will identify 3 memory strategies to assist with recalling information.    Time 4    Period Weeks    Status New      SLP SHORT TERM GOAL #2   Title Pt will identify 3 attention strategies to assist with reducing cognitive fatigue.    Time 4    Period Weeks    Status New      SLP SHORT TERM GOAL #3   Title Further assessment of language skills warranted.    Time 1    Period Weeks    Status Achieved            SLP Long Term Goals - 01/02/21 0810      SLP LONG TERM GOAL #1   Title Patient will demonstrate use of memory strategies to schedule activities, recall weekly events and items to maintain safety to participate socially in functional living environment    Time 8    Period Weeks    Status New  SLP LONG TERM GOAL #2   Title Patient will develop functional attention skills to effectively attend to and communicate in tasks of daily living in their functional living environment.    Time 8    Period Weeks    Status New            Plan - 01/02/21 9735    Clinical Impression Statement Pt's performance on the BNT indicated deficits in word finding. She reports it is worse in conversation. Also noted deficits in alternating attention and delayed processing. SLP rec skilled speech services to address cognitive-communication impairment.    Speech Therapy Frequency 2x / week    Duration 8 weeks    Treatment/Interventions Compensatory strategies;Cueing hierarchy;Functional tasks;Patient/family education;Environmental controls;Cognitive reorganization;Multimodal communcation approach;Language facilitation;Compensatory techniques;Internal/external aids;SLP instruction and feedback    Potential Considerations Ability to learn/carryover information    Consulted and Agree with Plan  of Care Patient;Family member/caregiver    Family Member Consulted [name]- Husband           Patient will benefit from skilled therapeutic intervention in order to improve the following deficits and impairments:   Cognitive communication deficit    Problem List Patient Active Problem List   Diagnosis Date Noted  . Shortness of breath 11/27/2020  . Cough 11/27/2020  . Anxiety 11/27/2020  . History of COVID-19 11/27/2020  . Stutter 11/27/2020  . Tremor 11/27/2020  . Insomnia 11/27/2020  . Vertigo 11/27/2020  . Physical deconditioning 11/27/2020  . Vitamin D deficiency 11/27/2020  . Tremors of nervous system 11/20/2020  . Anxiety disorder 11/20/2020  . Memory loss 11/20/2020  . 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 Pneumonia due to COVID-19 virus 08/07/2020  . Foot sprain, left, subsequent encounter 05/16/2020  . Menopause 10/05/2019  . Palpitations 10/05/2019  . SOB (shortness of breath) 10/05/2019  . Cellulitis of face 08/28/2018  . CKD (chronic kidney disease), stage II 09/18/2016  . Gestational diabetes 05/18/2014  . Trigeminal neuralgia 05/11/2013  . GOITER 02/10/2009  . Essential hypertension 04/29/2007  . HEADACHE 04/29/2007    Dorena Bodo MS, [place], CBIS  01/02/2021, 8:42 AM  [facility] [address] [place], [place], [address] Phone: [phone]   Fax:  [phone]   Name: Cynthia Roach MRN: [id number] Date of Birth: [birth year]

## 2021-01-02 NOTE — Patient Instructions (Signed)
Access Code: [email or web address] URL: [email or web address] Date: 01/02/2021 Prepared by: Clydie Braun Archie Atilano  Exercises Brandt-Daroff Vestibular Exercise - 1 x daily - 7 x weekly - 1 sets - 5 reps Seated Gaze Stabilization with Head Rotation - 1 x daily - 7 x weekly - 10 reps

## 2021-01-02 NOTE — Therapy (Signed)
[facility] [address] [place], [place], [address] Phone: [phone]   Fax:  [phone]  Physical Therapy Treatment  Patient Details  Name: Cynthia Roach MRN: [id number] Date of Birth: March 19, 1971 Referring Provider (PT): Ivonne Andrew, NP   Encounter Date: 01/02/2021   PT End of Session - 01/02/21 4801    Visit Number 2    Number of Visits 17    Date for PT Re-Evaluation 02/23/21    Authorization Type Aetna - 90 visits combined total    PT Start Time 0845    PT Stop Time 0925    PT Time Calculation (min) 40 min    Activity Tolerance Patient tolerated treatment well    Behavior During Therapy [facility] for tasks assessed/performed           Past Medical History:  Diagnosis Date  . Chronic kidney disease    left ureteral stone  . Hypertension   . Ovarian cyst   . Seasonal allergies   . Trigeminal neuralgia     Past Surgical History:  Procedure Laterality Date  . CESAREAN SECTION    . CYSTOSCOPY WITH RETROGRADE PYELOGRAM, URETEROSCOPY AND STENT PLACEMENT Left 10/15/2020   Procedure: CYSTOSCOPY WITH RETROGRADE PYELOGRAM, URETEROSCOPY AND STENT PLACEMENT laser lithotrispsy and basket extraction;  Surgeon: Crist Fat, MD;  Location: [facility];  Service: Urology;  Laterality: Left;    There were no vitals filed for this visit.   Subjective Assessment - 01/02/21 0852    Subjective Pt reports that she is still having dizziness and that nothing that she has done will help with her vertigo.    Patient Stated Goals be able to drive more easily, walk to end of street or around neighborhood with granddaughter, be able to take laundry up or downstairs, not having family worry.    Currently in Pain? No/denies                             [facility] Adult PT Treatment/Exercise - 01/02/21 0001      High Level Balance   High Level Balance Activities Other (comment)   Dix Hallpike   High Level Balance  Comments Proceeded to treat R side with Epley Maneuver.  Educated pt on [employer] and performed x1 rep.  VOR seated.      Exercises   Exercises Knee/Hip      Knee/Hip Exercises: Aerobic   Nustep L4 x6 min                    PT Short Term Goals - 12/22/20 1145      PT SHORT TERM GOAL #1   Title Independent with initial HEP with reminders as needed to compensate for memory deficits    Time 2    Period Weeks    Status New    Target Date 01/05/21             PT Long Term Goals - 12/22/20 1146      PT LONG TERM GOAL #1   Title Pt will score at least 52/56 on berg balance scale with minimal to no dizziness, nausea, or headache exacerbations to demonstrate decreased falls risk.    Baseline initial 12/22/20: 39/56 with significant symptom exacrbation    Time 8    Period Weeks    Status New    Target Date 02/23/21      PT LONG  TERM GOAL #2   Title Pt will achieve score on and improve to [facility] for age without nausea,dizziness to facilitate functional ambulation in the home and community environments.    Time 8    Period Weeks    Status New    Target Date 02/23/21      PT LONG TERM GOAL #3   Title Pt will demonstrate improved BLE strength to at least 4+/5 and improved coordination quality of BLE.    Time 8    Period Weeks    Status New    Target Date 02/23/21      PT LONG TERM GOAL #4   Title Pt will be able to negotiate up/down stairs while holding objects to facilitate independence with laundry in the home.    Time 8    Period Weeks    Target Date 02/23/21      PT LONG TERM GOAL #5   Title Pt will report at least 50% reduction in symptoms    Time 8    Period Weeks    Status New    Target Date 02/23/21                 Plan - 01/02/21 1021    Clinical Impression Statement Pt admits to dizziness when lying in bed and rolling to right side.  Pt tested positive on R side Gilberto Better and proceeded with treatment via Teaching laboratory technician.  She reports  dizzness and nausea during maneuver, but symptoms resolved after approx 3 minutes to advance to next position.  She was able to demonstrate Austin Miles x1 session with dizzness reported each direction.  She would benefit from skilled PT to further progress vestibular impairment.    Personal Factors and Comorbidities Comorbidity 3+;Age;Time since onset of injury/illness/exacerbation    Comorbidities insomnia, tremors, long haul post COVID symptoms, hx trigeminal neuralgia right side of face, history of multiple concussions    PT Treatment/Interventions ADLs/Self Care Home Management;Cryotherapy;Electrical Stimulation;Iontophoresis 4mg /ml Dexamethasone;Moist Heat;Gait training;Functional mobility training;Therapeutic activities;Therapeutic exercise;Balance training;Neuromuscular re-education;Patient/family education;Manual techniques;Dry needling;Energy conservation;Vasopneumatic Device;Vestibular;Visual/perceptual remediation/compensation    PT Next Visit Plan Progress vestibular, recheck and perform Epley if needed    PT Home Exercise Plan Access Code: [email or web address]    Consulted and Agree with Plan of Care Patient           Patient will benefit from skilled therapeutic intervention in order to improve the following deficits and impairments:  Abnormal gait,Decreased strength,Decreased activity tolerance,Dizziness,Decreased balance,Pain,Decreased coordination,Impaired vision/preception,Decreased endurance  Visit Diagnosis: Repeated falls  Muscle weakness (generalized)  Unsteadiness on feet  Dizziness and giddiness  Pain in extremity, unspecified extremity     Problem List Patient Active Problem List   Diagnosis Date Noted  . Shortness of breath 11/27/2020  . Cough 11/27/2020  . Anxiety 11/27/2020  . History of COVID-19 11/27/2020  . Stutter 11/27/2020  . Tremor 11/27/2020  . Insomnia 11/27/2020  . Vertigo 11/27/2020  . Physical deconditioning 11/27/2020  . Vitamin D  deficiency 11/27/2020  . Tremors of nervous system 11/20/2020  . Anxiety disorder 11/20/2020  . Memory loss 11/20/2020  . 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 Pneumonia due to COVID-19 virus 08/07/2020  . Foot sprain, left, subsequent encounter 05/16/2020  . Menopause 10/05/2019  . Palpitations 10/05/2019  . SOB (shortness of breath) 10/05/2019  . Cellulitis of face 08/28/2018  . CKD (chronic kidney disease), stage II 09/18/2016  . Gestational diabetes 05/18/2014  . Trigeminal neuralgia 05/11/2013  .  GOITER 02/10/2009  . Essential hypertension 04/29/2007  . HEADACHE 04/29/2007    Reather Laurence, PT, DPT 01/02/2021, 10:32 AM  [facility] [address]. [place], [place], [address] Phone: [phone]   Fax:  [phone]  Name: Cynthia Roach MRN: [id number] Date of Birth: 1971-07-09

## 2021-01-04 ENCOUNTER — Ambulatory Visit: Payer: 59 | Admitting: Speech Pathology

## 2021-01-04 ENCOUNTER — Ambulatory Visit: Payer: 59 | Admitting: Rehabilitative and Restorative Service Providers"

## 2021-01-09 ENCOUNTER — Encounter: Payer: Self-pay | Admitting: Speech Pathology

## 2021-01-09 ENCOUNTER — Other Ambulatory Visit: Payer: Self-pay

## 2021-01-09 ENCOUNTER — Ambulatory Visit: Payer: 59

## 2021-01-09 ENCOUNTER — Ambulatory Visit: Payer: 59 | Admitting: Speech Pathology

## 2021-01-09 DIAGNOSIS — R296 Repeated falls: Secondary | ICD-10-CM | POA: Diagnosis not present

## 2021-01-09 DIAGNOSIS — R2681 Unsteadiness on feet: Secondary | ICD-10-CM

## 2021-01-09 DIAGNOSIS — R42 Dizziness and giddiness: Secondary | ICD-10-CM

## 2021-01-09 DIAGNOSIS — M6281 Muscle weakness (generalized): Secondary | ICD-10-CM

## 2021-01-09 DIAGNOSIS — R41841 Cognitive communication deficit: Secondary | ICD-10-CM

## 2021-01-09 DIAGNOSIS — R252 Cramp and spasm: Secondary | ICD-10-CM

## 2021-01-09 DIAGNOSIS — M79609 Pain in unspecified limb: Secondary | ICD-10-CM

## 2021-01-09 NOTE — Therapy (Signed)
[facility] [address] [place], [place], [address] Phone: [phone]   Fax:  [phone]  Speech Language Pathology Treatment  Patient Details  Name: Cynthia Roach MRN: [id number] Date of Birth: 15-Oct-1970 Referring Provider (SLP): Angus Seller NP   Encounter Date: 01/09/2021   End of Session - 01/09/21 0843    Visit Number 3    Number of Visits 17    Date for SLP Re-Evaluation 02/04/21    SLP Start Time 0803    SLP Stop Time  0844    SLP Time Calculation (min) 41 min    Activity Tolerance Patient tolerated treatment well           Past Medical History:  Diagnosis Date  . Chronic kidney disease    left ureteral stone  . Hypertension   . Ovarian cyst   . Seasonal allergies   . Trigeminal neuralgia     Past Surgical History:  Procedure Laterality Date  . CESAREAN SECTION    . CYSTOSCOPY WITH RETROGRADE PYELOGRAM, URETEROSCOPY AND STENT PLACEMENT Left 10/15/2020   Procedure: CYSTOSCOPY WITH RETROGRADE PYELOGRAM, URETEROSCOPY AND STENT PLACEMENT laser lithotrispsy and basket extraction;  Surgeon: Crist Fat, MD;  Location: [facility];  Service: Urology;  Laterality: Left;    There were no vitals filed for this visit.   Subjective Assessment - 01/09/21 0805    Subjective Reports she has a had a headache for the past 4-5 days.    Currently in Pain? Yes    Pain Score 7     Pain Location Head    Pain Descriptors / Indicators Pressure;Dull    Pain Onset In the past 7 days    Pain Relieving Factors Medication                 ADULT SLP TREATMENT - 01/09/21 0839      General Information   Behavior/Cognition Alert;Cooperative      Treatment Provided   Treatment provided Cognitive-Linquistic      Cognitive-Linquistic Treatment   Treatment focused on Cognition    Skilled Treatment Trained patient in attention strategies. She reported she could benefit from family edu on how to communicate  with her due to slower processing.      Assessment / Recommendations / Plan   Plan Continue with current plan of care      Progression Toward Goals   Progression toward goals Progressing toward goals              SLP Short Term Goals - 01/09/21 0844      SLP SHORT TERM GOAL #1   Title Pt will identify 3 memory strategies to assist with recalling information.    Time 3    Period Weeks    Status On-going      SLP SHORT TERM GOAL #2   Title Pt will identify 3 attention strategies to assist with reducing cognitive fatigue.    Time 3    Period Weeks    Status On-going      SLP SHORT TERM GOAL #3   Title Further assessment of language skills warranted.    Time 1    Period Weeks    Status Achieved            SLP Long Term Goals - 01/09/21 0844      SLP LONG TERM GOAL #1   Title Patient will demonstrate use of memory strategies to schedule activities, recall weekly events and  items to maintain safety to participate socially in functional living environment    Time 7    Period Weeks    Status On-going      SLP LONG TERM GOAL #2   Title Patient will develop functional attention skills to effectively attend to and communicate in tasks of daily living in their functional living environment.    Time 7    Period Weeks    Status On-going            Plan - 01/09/21 0843    Clinical Impression Statement Benefited from extra pauses for processing today.  SLP rec skilled speech services to address cognitive-communication impairment.    Speech Therapy Frequency 2x / week    Duration 8 weeks    Treatment/Interventions Compensatory strategies;Cueing hierarchy;Functional tasks;Patient/family education;Environmental controls;Cognitive reorganization;Multimodal communcation approach;Language facilitation;Compensatory techniques;Internal/external aids;SLP instruction and feedback    Potential Considerations Ability to learn/carryover information    Consulted and Agree with Plan of  Care Patient;Family member/caregiver    Family Member Consulted [name]- Husband           Patient will benefit from skilled therapeutic intervention in order to improve the following deficits and impairments:   Cognitive communication deficit    Problem List Patient Active Problem List   Diagnosis Date Noted  . Shortness of breath 11/27/2020  . Cough 11/27/2020  . Anxiety 11/27/2020  . History of COVID-19 11/27/2020  . Stutter 11/27/2020  . Tremor 11/27/2020  . Insomnia 11/27/2020  . Vertigo 11/27/2020  . Physical deconditioning 11/27/2020  . Vitamin D deficiency 11/27/2020  . Tremors of nervous system 11/20/2020  . Anxiety disorder 11/20/2020  . Memory loss 11/20/2020  . 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 Pneumonia due to COVID-19 virus 08/07/2020  . Foot sprain, left, subsequent encounter 05/16/2020  . Menopause 10/05/2019  . Palpitations 10/05/2019  . SOB (shortness of breath) 10/05/2019  . Cellulitis of face 08/28/2018  . CKD (chronic kidney disease), stage II 09/18/2016  . Gestational diabetes 05/18/2014  . Trigeminal neuralgia 05/11/2013  . GOITER 02/10/2009  . Essential hypertension 04/29/2007  . HEADACHE 04/29/2007    Dorena Bodo MS, [place], CBIS  01/09/2021, 8:46 AM  [facility] [address]. [place], [place], [address] Phone: [phone]   Fax:  [phone]   Name: Afton Lavalle MRN: [id number] Date of Birth: 01-29-1971

## 2021-01-09 NOTE — Therapy (Signed)
[facility] [address]. [place], [place], [address] Phone: [phone]   Fax:  [phone]  Physical Therapy Treatment  Patient Details  Name: Cynthia Roach MRN: [id number] Date of Birth: Dec 25, 1970 Referring Provider (PT): Ivonne Andrew, NP   Encounter Date: 01/09/2021   PT End of Session - 01/09/21 0850    Visit Number 3    Number of Visits 17    Date for PT Re-Evaluation 02/23/21    Authorization Type Aetna - 90 visits combined total    PT Start Time 0845    PT Stop Time 0930    PT Time Calculation (min) 45 min    Activity Tolerance Patient tolerated treatment well    Behavior During Therapy [facility] for tasks assessed/performed           Past Medical History:  Diagnosis Date  . Chronic kidney disease    left ureteral stone  . Hypertension   . Ovarian cyst   . Seasonal allergies   . Trigeminal neuralgia     Past Surgical History:  Procedure Laterality Date  . CESAREAN SECTION    . CYSTOSCOPY WITH RETROGRADE PYELOGRAM, URETEROSCOPY AND STENT PLACEMENT Left 10/15/2020   Procedure: CYSTOSCOPY WITH RETROGRADE PYELOGRAM, URETEROSCOPY AND STENT PLACEMENT laser lithotrispsy and basket extraction;  Surgeon: Crist Fat, MD;  Location: [facility];  Service: Urology;  Laterality: Left;    There were no vitals filed for this visit.   Subjective Assessment - 01/09/21 0848    Subjective Pt reports that she is still having vertigo. Dizziness 9/10.  when asked about how she felt after last PT session she reports she could not remember    Patient Stated Goals be able to drive more easily, walk to end of street or around neighborhood with granddaughter, be able to take laundry up or downstairs, not having family worry.    Currently in Pain? Yes    Pain Score 7     Pain Location Head    Pain Orientation Right;Left    Pain Descriptors / Indicators Pressure              [facility] PT Assessment - 01/09/21 0001       6 Minute Walk- Baseline   6 Minute Walk- Baseline yes    02 Sat (%RA) 98 %    Modified Borg Scale for Dyspnea 1- Very mild shortness of breath    Perceived Rate of Exertion (Borg) --   dizzy 4/10     6 Minute walk- Post Test   6 Minute Walk Post Test yes    02 Sat (%RA) 97 %    Modified Borg Scale for Dyspnea 3- Moderate shortness of breath or breathing difficulty    Perceived Rate of Exertion (Borg) --   6/10 modified RPE and  dizziness     6 minute walk test results    Aerobic Endurance Distance Walked --   900 feet (274.32 m).                        [facility] Adult PT Treatment/Exercise - 01/09/21 0001      Exercises   Exercises Shoulder;Knee/Hip;Neck;Lumbar      Neck Exercises: Machines for Strengthening   Cybex Row 20# 2 x 10    Lat Pull 20# 2 x 10      Knee/Hip Exercises: Aerobic   Nustep L5 x 6 min      Knee/Hip Exercises:  Seated   Marching AROM;Both;2 sets;15 reps      Neck Exercises: Stretches   Upper Trapezius Stretch 2 reps;Right;Left;20 seconds    Upper Trapezius Stretch Limitations seated           Vestibular Treatment/Exercise - 01/09/21 0001      Vestibular Treatment/Exercise   Vestibular Treatment Provided Habituation;Gaze    Habituation Exercises Seated Horizontal Head Turns;Seated Vertical Head Turns    Gaze Exercises X1 Viewing Horizontal;X1 Viewing Vertical;Comment      Seated Horizontal Head Turns   Number of Reps  --   5 x 2 - requiring rest with visual fixation bw sets d/t incr dizziness to 7/10   Symptom Description  dizziness increased but denies any nausea or incr in headache      Seated Vertical Head Turns   Number of Reps  10   less symptom exacerbation   Symptom Description  9/10 dizziness, a little bit queasy, headache the same.      X1 Viewing Horizontal   Foot Position seated, feet supported hip width    Reps --    Comments first set of 10: short range mvmts, dizzy up to 9/10 decreased to 4-5/10 after about 2 minutes.       X1 Viewing Vertical   Foot Position seated    Reps 10    Comments able to tolreate fuller ROM, dizzy up to 8/10 ans nausea, decreased to 4-5/10 after about 2 minutes.      Eye/Head Exercise Vertical   Comment cervical AROM rotation to look at targets 6/10 nausea and dizziness with 5 reps.                   PT Short Term Goals - 12/22/20 1145      PT SHORT TERM GOAL #1   Title Independent with initial HEP with reminders as needed to compensate for memory deficits    Time 2    Period Weeks    Status New    Target Date 01/05/21             PT Long Term Goals - 01/09/21 0928      PT LONG TERM GOAL #1   Title Pt will score at least 52/56 on berg balance scale with minimal to no dizziness, nausea, or headache exacerbations to demonstrate decreased falls risk.    Baseline initial 12/22/20: 39/56 with significant symptom exacrbation    Time 8    Period Weeks      PT LONG TERM GOAL #2   Title Pt will achieve score on and improve to [facility] for age without nausea,dizziness to facilitate functional ambulation in the home and community environments.    Baseline 01/09/21 initial: 900 feet (274.32 m).    Time 8    Period Weeks      PT LONG TERM GOAL #3   Title Pt will demonstrate improved BLE strength to at least 4+/5 and improved coordination quality of BLE.    Time 8    Period Weeks      PT LONG TERM GOAL #4   Title Pt will be able to negotiate up/down stairs while holding objects to facilitate independence with laundry in the home.    Time 8    Period Weeks      PT LONG TERM GOAL #5   Title Pt will report at least 50% reduction in symptoms    Time 8    Period Weeks  Plan - 01/09/21 0850    Clinical Impression Statement Continues to c/o dizziness, headache at baseline, unable to recall if different or any better from last session d/t STM deficits. She was able to tolerate some more exercises out in gym today for postural mms. Was able to  assess today and compelted 900 feet (274.32 m). Took alot of session completing seated vestibular exercises which exacerbated symptoms but pt reported symptoms diminished after visual fixation held for 1-2:30 minutes.    Personal Factors and Comorbidities Comorbidity 3+;Age;Time since onset of injury/illness/exacerbation    Comorbidities insomnia, tremors, long haul post COVID symptoms, hx trigeminal neuralgia right side of face, history of multiple concussions    PT Treatment/Interventions ADLs/Self Care Home Management;Cryotherapy;Electrical Stimulation;Iontophoresis 4mg /ml Dexamethasone;Moist Heat;Gait training;Functional mobility training;Therapeutic activities;Therapeutic exercise;Balance training;Neuromuscular re-education;Patient/family education;Manual techniques;Dry needling;Energy conservation;Vasopneumatic Device;Vestibular;Visual/perceptual remediation/compensation    PT Next Visit Plan Progress vestibular, recheck and perform Epley if needed    PT Home Exercise Plan Access Code: [email or web address]    Consulted and Agree with Plan of Care Patient           Patient will benefit from skilled therapeutic intervention in order to improve the following deficits and impairments:  Abnormal gait,Decreased strength,Decreased activity tolerance,Dizziness,Decreased balance,Pain,Decreased coordination,Impaired vision/preception,Decreased endurance  Visit Diagnosis: Repeated falls  Muscle weakness (generalized)  Unsteadiness on feet  Dizziness and giddiness  Pain in extremity, unspecified extremity  Cramp and spasm     Problem List Patient Active Problem List   Diagnosis Date Noted  . Shortness of breath 11/27/2020  . Cough 11/27/2020  . Anxiety 11/27/2020  . History of COVID-19 11/27/2020  . Stutter 11/27/2020  . Tremor 11/27/2020  . Insomnia 11/27/2020  . Vertigo 11/27/2020  . Physical deconditioning 11/27/2020  . Vitamin D deficiency 11/27/2020  . Tremors of  nervous system 11/20/2020  . Anxiety disorder 11/20/2020  . Memory loss 11/20/2020  . 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 Pneumonia due to COVID-19 virus 08/07/2020  . Foot sprain, left, subsequent encounter 05/16/2020  . Menopause 10/05/2019  . Palpitations 10/05/2019  . SOB (shortness of breath) 10/05/2019  . Cellulitis of face 08/28/2018  . CKD (chronic kidney disease), stage II 09/18/2016  . Gestational diabetes 05/18/2014  . Trigeminal neuralgia 05/11/2013  . GOITER 02/10/2009  . Essential hypertension 04/29/2007  . HEADACHE 04/29/2007    06/29/2007, PT, DPT 01/09/2021, 9:29 AM  [facility] [address] [place], [place], [place] Phone: [phone]   Fax:  [phone]  Name: Hazelgrace Bonham MRN: Cardell Peach Date of Birth: 09/03/1970

## 2021-01-11 ENCOUNTER — Ambulatory Visit: Payer: 59

## 2021-01-11 ENCOUNTER — Ambulatory Visit: Payer: 59 | Admitting: Speech Pathology

## 2021-01-11 ENCOUNTER — Other Ambulatory Visit: Payer: Self-pay

## 2021-01-11 ENCOUNTER — Encounter: Payer: Self-pay | Admitting: Speech Pathology

## 2021-01-11 DIAGNOSIS — M79609 Pain in unspecified limb: Secondary | ICD-10-CM

## 2021-01-11 DIAGNOSIS — R2681 Unsteadiness on feet: Secondary | ICD-10-CM

## 2021-01-11 DIAGNOSIS — R42 Dizziness and giddiness: Secondary | ICD-10-CM

## 2021-01-11 DIAGNOSIS — M6281 Muscle weakness (generalized): Secondary | ICD-10-CM

## 2021-01-11 DIAGNOSIS — R296 Repeated falls: Secondary | ICD-10-CM | POA: Diagnosis not present

## 2021-01-11 DIAGNOSIS — R252 Cramp and spasm: Secondary | ICD-10-CM

## 2021-01-11 DIAGNOSIS — R41841 Cognitive communication deficit: Secondary | ICD-10-CM

## 2021-01-11 NOTE — Therapy (Signed)
[facility] [address] [place], [place], [address] Phone: [phone]   Fax:  [phone]  Speech Language Pathology Treatment  Patient Details  Name: Cynthia Roach MRN: [id number] Date of Birth: 30-Jul-1971 Referring Provider (SLP): Angus Seller NP   Encounter Date: 01/11/2021   End of Session - 01/11/21 0849    Visit Number 4    Number of Visits 17    Date for SLP Re-Evaluation 02/04/21    SLP Start Time 0803    SLP Stop Time  0845    SLP Time Calculation (min) 42 min    Activity Tolerance Patient tolerated treatment well           Past Medical History:  Diagnosis Date  . Chronic kidney disease    left ureteral stone  . Hypertension   . Ovarian cyst   . Seasonal allergies   . Trigeminal neuralgia     Past Surgical History:  Procedure Laterality Date  . CESAREAN SECTION    . CYSTOSCOPY WITH RETROGRADE PYELOGRAM, URETEROSCOPY AND STENT PLACEMENT Left 10/15/2020   Procedure: CYSTOSCOPY WITH RETROGRADE PYELOGRAM, URETEROSCOPY AND STENT PLACEMENT laser lithotrispsy and basket extraction;  Surgeon: Crist Fat, MD;  Location: [facility];  Service: Urology;  Laterality: Left;    There were no vitals filed for this visit.   Subjective Assessment - 01/11/21 0804    Subjective Pt husband joined [place] in today's session. Pt reported that she had trouble conveying her message.    Currently in Pain? No/denies                 ADULT SLP TREATMENT - 01/11/21 0823      General Information   Behavior/Cognition Alert;Cooperative      Treatment Provided   Treatment provided Cognitive-Linquistic      Cognitive-Linquistic Treatment   Treatment focused on Cognition    Skilled Treatment Trained patient and husband in communication strategies. Pt and husband felt like these communication strategies are going to be succesful moving forward. Husband believed this "shined a light on what is really going on." Pt  demonstrated struggle with reading handout. SLP provided a strategy for visual attention (blocking out words w/ blank sheet of paper). Spoke about ways that may be benefical when they begin doing a bible study together.      Assessment / Recommendations / Plan   Plan Continue with current plan of care      Progression Toward Goals   Progression toward goals Progressing toward goals            SLP Education - 01/11/21 0848    Education Details Communication strategies.    Person(s) Educated Patient    Methods Explanation;Demonstration;Handout    Comprehension Verbalized understanding;Returned demonstration;Need further instruction            SLP Short Term Goals - 01/11/21 0851      SLP SHORT TERM GOAL #1   Title Pt will identify 3 memory strategies to assist with recalling information.    Time 3    Period Weeks    Status On-going      SLP SHORT TERM GOAL #2   Title Pt will identify 3 attention strategies to assist with reducing cognitive fatigue.    Time 3    Period Weeks    Status On-going      SLP SHORT TERM GOAL #3   Title Further assessment of language skills warranted.    Time 1  Period Weeks    Status Achieved            SLP Long Term Goals - 01/11/21 6256      SLP LONG TERM GOAL #1   Title Patient will demonstrate use of memory strategies to schedule activities, recall weekly events and items to maintain safety to participate socially in functional living environment    Time 7    Period Weeks    Status On-going      SLP LONG TERM GOAL #2   Title Patient will develop functional attention skills to effectively attend to and communicate in tasks of daily living in their functional living environment.    Time 7    Period Weeks    Status On-going            Plan - 01/11/21 0849    Clinical Impression Statement Pt became emotional in today's session when attempting to read. She was able to read, but had decrease rate of reading. Pt reported she has  trouble to reading to her granddaughter. Pt benefits from extra time/pauses for processing. After discussing communication strategies, SLP had patient read them and then recall examples that SLP provided. Pt was able to complete with minA.    Speech Therapy Frequency 2x / week    Duration 8 weeks    Treatment/Interventions Compensatory strategies;Cueing hierarchy;Functional tasks;Patient/family education;Environmental controls;Cognitive reorganization;Multimodal communcation approach;Language facilitation;Compensatory techniques;Internal/external aids;SLP instruction and feedback    Potential Considerations Ability to learn/carryover information    Consulted and Agree with Plan of Care Patient;Family member/caregiver    Family Member Consulted [name]- Husband           Patient will benefit from skilled therapeutic intervention in order to improve the following deficits and impairments:   Cognitive communication deficit    Problem List Patient Active Problem List   Diagnosis Date Noted  . Shortness of breath 11/27/2020  . Cough 11/27/2020  . Anxiety 11/27/2020  . History of COVID-19 11/27/2020  . Stutter 11/27/2020  . Tremor 11/27/2020  . Insomnia 11/27/2020  . Vertigo 11/27/2020  . Physical deconditioning 11/27/2020  . Vitamin D deficiency 11/27/2020  . Tremors of nervous system 11/20/2020  . Anxiety disorder 11/20/2020  . Memory loss 11/20/2020  . 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 Pneumonia due to COVID-19 virus 08/07/2020  . Foot sprain, left, subsequent encounter 05/16/2020  . Menopause 10/05/2019  . Palpitations 10/05/2019  . SOB (shortness of breath) 10/05/2019  . Cellulitis of face 08/28/2018  . CKD (chronic kidney disease), stage II 09/18/2016  . Gestational diabetes 05/18/2014  . Trigeminal neuralgia 05/11/2013  . GOITER 02/10/2009  . Essential hypertension 04/29/2007  . HEADACHE 04/29/2007    Dorena Bodo MS, [place],  CBIS  01/11/2021, 8:53 AM  [facility] [address] [place], [place], [address] Phone: [phone]   Fax:  [phone]   Name: Aviance Cooperwood MRN: [id number] Date of Birth: 29-Oct-1970

## 2021-01-11 NOTE — Therapy (Signed)
[facility] [address] [place], [place], [address] Phone: [phone]   Fax:  [phone]  Physical Therapy Treatment  Patient Details  Name: Cynthia Roach MRN: [id number] Date of Birth: 30-Apr-1971 Referring Provider (PT): Ivonne Andrew, NP   Encounter Date: 01/11/2021   PT End of Session - 01/11/21 0844    Visit Number 4    Number of Visits 17    Date for PT Re-Evaluation 02/23/21    Authorization Type [employer] - 90 visits combined total    PT Start Time 0845    PT Stop Time 0930    PT Time Calculation (min) 45 min    Activity Tolerance Patient tolerated treatment well    Behavior During Therapy [facility] for tasks assessed/performed           Past Medical History:  Diagnosis Date  . Chronic kidney disease    left ureteral stone  . Hypertension   . Ovarian cyst   . Seasonal allergies   . Trigeminal neuralgia     Past Surgical History:  Procedure Laterality Date  . CESAREAN SECTION    . CYSTOSCOPY WITH RETROGRADE PYELOGRAM, URETEROSCOPY AND STENT PLACEMENT Left 10/15/2020   Procedure: CYSTOSCOPY WITH RETROGRADE PYELOGRAM, URETEROSCOPY AND STENT PLACEMENT laser lithotrispsy and basket extraction;  Surgeon: Crist Fat, MD;  Location: [facility];  Service: Urology;  Laterality: Left;    There were no vitals filed for this visit.   Subjective Assessment - 01/11/21 0849    Subjective Alot of vertigo today, rushing this morning    Patient Stated Goals be able to drive more easily, walk to end of street or around neighborhood with granddaughter, be able to take laundry up or downstairs, not having family worry.    Currently in Pain? Yes    Pain Score 4     Pain Location Knee    Pain Orientation Right                             [facility] Adult PT Treatment/Exercise - 01/11/21 0001      Bed Mobility         High Level Balance   High Level Balance Activities Side stepping;Backward walking     High Level Balance Comments marching 2 laps opp hand/knee touches - 8/10 dizziness but no LOB.      Neck Exercises: Machines for Strengthening   Cybex Row 20# 2 x 10    Lat Pull 20# 2 x 10      Lumbar Exercises: Seated   Sit to Stand --   from elevated mat table 10 x 2 holding yellow med ball.     Knee/Hip Exercises: Aerobic   Nustep L5 x 6 min           Vestibular Treatment/Exercise - 01/11/21 0001      Seated Horizontal Head Turns   Number of Reps  10   able to complete all without rest in between but with 7/10 dizziness, some nausea     Seated Vertical Head Turns   Number of Reps  10      X1 Viewing Horizontal   Foot Position seated, feet supported hip width    Comments 8/10 dizziness      X1 Viewing Vertical   Foot Position seated    Reps 10    Comments 8/10 dizziness      Eye/Head Exercise Vertical  Comment gaze switching near far working on convergence x 10 - very small range eye mvmts with little to no convergence on the righ near point of convergence around 2 ft awayt. 3/10 nausea, dizzy 9/10.   trialed pencil pushup with exacerbation of headache                  PT Short Term Goals - 12/22/20 1145      PT SHORT TERM GOAL #1   Title Independent with initial HEP with reminders as needed to compensate for memory deficits    Time 2    Period Weeks    Status New    Target Date 01/05/21             PT Long Term Goals - 01/09/21 0928      PT LONG TERM GOAL #1   Title Pt will score at least 52/56 on berg balance scale with minimal to no dizziness, nausea, or headache exacerbations to demonstrate decreased falls risk.    Baseline initial 12/22/20: 39/56 with significant symptom exacrbation    Time 8    Period Weeks      PT LONG TERM GOAL #2   Title Pt will achieve score on and improve to [facility] for age without nausea,dizziness to facilitate functional ambulation in the home and community environments.    Baseline 01/09/21 initial: 900 feet  (274.32 m).    Time 8    Period Weeks      PT LONG TERM GOAL #3   Title Pt will demonstrate improved BLE strength to at least 4+/5 and improved coordination quality of BLE.    Time 8    Period Weeks      PT LONG TERM GOAL #4   Title Pt will be able to negotiate up/down stairs while holding objects to facilitate independence with laundry in the home.    Time 8    Period Weeks      PT LONG TERM GOAL #5   Title Pt will report at least 50% reduction in symptoms    Time 8    Period Weeks                 Plan - 01/11/21 0947    Clinical Impression Statement Continues to tolerate exercise progressions fairly. Would require a few seconds to process instructions for multistep exercises (like STS wth OHP). Mid session reports she was placed on a heart monitor.  With vestibular exercises, saccadic jerks noted throughout with symptoms of  dizziness and occasional nausea, symptoms decreasing after about 2 minutes each. assessed convergence today and near point is abnormal almost 2 ft away . Trialed pencil pushup exercise but because of such far near point this increased HA end of session. Plan to utilize brock strings for this in upcoming visits.    Personal Factors and Comorbidities Comorbidity 3+;Age;Time since onset of injury/illness/exacerbation    Comorbidities insomnia, tremors, long haul post COVID symptoms, hx trigeminal neuralgia right side of face, history of multiple concussions    PT Treatment/Interventions ADLs/Self Care Home Management;Cryotherapy;Electrical Stimulation;Iontophoresis 4mg /ml Dexamethasone;Moist Heat;Gait training;Functional mobility training;Therapeutic activities;Therapeutic exercise;Balance training;Neuromuscular re-education;Patient/family education;Manual techniques;Dry needling;Energy conservation;Vasopneumatic Device;Vestibular;Visual/perceptual remediation/compensation    PT Next Visit Plan Progress vestibular, recheck and perform Epley if needed.  brock strings, NPC training    PT Home Exercise Plan Access Code: [email or web address]    Consulted and Agree with Plan of Care Patient           Patient will  benefit from skilled therapeutic intervention in order to improve the following deficits and impairments:  Abnormal gait,Decreased strength,Decreased activity tolerance,Dizziness,Decreased balance,Pain,Decreased coordination,Impaired vision/preception,Decreased endurance  Visit Diagnosis: Repeated falls  Muscle weakness (generalized)  Unsteadiness on feet  Dizziness and giddiness  Pain in extremity, unspecified extremity  Cramp and spasm     Problem List Patient Active Problem List   Diagnosis Date Noted  . Shortness of breath 11/27/2020  . Cough 11/27/2020  . Anxiety 11/27/2020  . History of COVID-19 11/27/2020  . Stutter 11/27/2020  . Tremor 11/27/2020  . Insomnia 11/27/2020  . Vertigo 11/27/2020  . Physical deconditioning 11/27/2020  . Vitamin D deficiency 11/27/2020  . Tremors of nervous system 11/20/2020  . Anxiety disorder 11/20/2020  . Memory loss 11/20/2020  . 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 Pneumonia due to COVID-19 virus 08/07/2020  . Foot sprain, left, subsequent encounter 05/16/2020  . Menopause 10/05/2019  . Palpitations 10/05/2019  . SOB (shortness of breath) 10/05/2019  . Cellulitis of face 08/28/2018  . CKD (chronic kidney disease), stage II 09/18/2016  . Gestational diabetes 05/18/2014  . Trigeminal neuralgia 05/11/2013  . GOITER 02/10/2009  . Essential hypertension 04/29/2007  . HEADACHE 04/29/2007    Anson Crofts, PT, DPT 01/11/2021, 9:36 AM  [facility] [address] [place], [place], [address] Phone: [phone]   Fax:  [phone]  Name: Darbie Biancardi MRN: [id number] Date of Birth: 10-07-1970

## 2021-01-16 ENCOUNTER — Ambulatory Visit: Payer: 59 | Admitting: Speech Pathology

## 2021-01-16 ENCOUNTER — Encounter: Payer: Self-pay | Admitting: Speech Pathology

## 2021-01-16 ENCOUNTER — Other Ambulatory Visit: Payer: Self-pay

## 2021-01-16 ENCOUNTER — Ambulatory Visit: Payer: 59

## 2021-01-16 DIAGNOSIS — R2681 Unsteadiness on feet: Secondary | ICD-10-CM

## 2021-01-16 DIAGNOSIS — M6281 Muscle weakness (generalized): Secondary | ICD-10-CM

## 2021-01-16 DIAGNOSIS — R42 Dizziness and giddiness: Secondary | ICD-10-CM

## 2021-01-16 DIAGNOSIS — R296 Repeated falls: Secondary | ICD-10-CM | POA: Diagnosis not present

## 2021-01-16 DIAGNOSIS — M79609 Pain in unspecified limb: Secondary | ICD-10-CM

## 2021-01-16 DIAGNOSIS — R41841 Cognitive communication deficit: Secondary | ICD-10-CM

## 2021-01-16 DIAGNOSIS — R252 Cramp and spasm: Secondary | ICD-10-CM

## 2021-01-16 NOTE — Therapy (Signed)
[facility] [address] [place], [place], [address] Phone: [phone]   Fax:  [phone]  Speech Language Pathology Treatment  Patient Details  Name: Cynthia Roach MRN: [id number] Date of Birth: 10-04-1970 Referring Provider (SLP): Angus Seller NP   Encounter Date: 01/16/2021   End of Session - 01/16/21 0910    Visit Number 5    Number of Visits 17    Date for SLP Re-Evaluation 02/04/21    SLP Start Time 0805   Pt late   SLP Stop Time  0845    SLP Time Calculation (min) 40 min    Activity Tolerance Patient tolerated treatment well           Past Medical History:  Diagnosis Date  . Chronic kidney disease    left ureteral stone  . Hypertension   . Ovarian cyst   . Seasonal allergies   . Trigeminal neuralgia     Past Surgical History:  Procedure Laterality Date  . CESAREAN SECTION    . CYSTOSCOPY WITH RETROGRADE PYELOGRAM, URETEROSCOPY AND STENT PLACEMENT Left 10/15/2020   Procedure: CYSTOSCOPY WITH RETROGRADE PYELOGRAM, URETEROSCOPY AND STENT PLACEMENT laser lithotrispsy and basket extraction;  Surgeon: Crist Fat, MD;  Location: [facility];  Service: Urology;  Laterality: Left;    There were no vitals filed for this visit.   Subjective Assessment - 01/16/21 0806    Subjective Pt reports she had success with using communication strategies with partner.    Currently in Pain? Yes    Pain Score 7     Pain Location Head    Pain Onset Other (comment)    Pain Frequency Constant   Reports that it is worse in the mornings.   Pain Relieving Factors Medication                 ADULT SLP TREATMENT - 01/16/21 0827      General Information   Behavior/Cognition Alert;Cooperative      Treatment Provided   Treatment provided Cognitive-Linquistic      Cognitive-Linquistic Treatment   Treatment focused on Cognition    Skilled Treatment Conducted treatment in a "distracting" environment with door  open. Facilitated increase attention by using real life scenarios (Cooking + Managing Schedule). Divergent thinking - 10 items for pt could cook for breakfast/lunch without using a stove.              SLP Short Term Goals - 01/16/21 0912      SLP SHORT TERM GOAL #1   Title Pt will identify 3 memory strategies to assist with recalling information.    Time 2    Period Weeks    Status On-going      SLP SHORT TERM GOAL #2   Title Pt will identify 3 attention strategies to assist with reducing cognitive fatigue.    Time 2    Period Weeks    Status On-going      SLP SHORT TERM GOAL #3   Title Further assessment of language skills warranted.    Time 1    Period Weeks    Status Achieved            SLP Long Term Goals - 01/16/21 0912      SLP LONG TERM GOAL #1   Title Patient will demonstrate use of memory strategies to schedule activities, recall weekly events and items to maintain safety to participate socially in functional living environment    Time 6  Period Weeks    Status On-going      SLP LONG TERM GOAL #2   Title Patient will develop functional attention skills to effectively attend to and communicate in tasks of daily living in their functional living environment.    Time 6    Period Weeks    Status On-going            Plan - 01/16/21 0910    Clinical Impression Statement Pt required min-to-modA with functional attention exercises. She benefited from practicing attention/organization strategies (breaking down tasks, completing one task at a time before continuing on the next, writing down key information).    Speech Therapy Frequency 2x / week    Duration 8 weeks    Treatment/Interventions Compensatory strategies;Cueing hierarchy;Functional tasks;Patient/family education;Environmental controls;Cognitive reorganization;Multimodal communcation approach;Language facilitation;Compensatory techniques;Internal/external aids;SLP instruction and feedback    Potential  Considerations Ability to learn/carryover information    Consulted and Agree with Plan of Care Patient;Family member/caregiver    Family Member Consulted Cynthia Roach- Husband           Patient will benefit from skilled therapeutic intervention in order to improve the following deficits and impairments:   Cognitive communication deficit    Problem List Patient Active Problem List   Diagnosis Date Noted  . Shortness of breath 11/27/2020  . Cough 11/27/2020  . Anxiety 11/27/2020  . History of COVID-19 11/27/2020  . Stutter 11/27/2020  . Tremor 11/27/2020  . Insomnia 11/27/2020  . Vertigo 11/27/2020  . Physical deconditioning 11/27/2020  . Vitamin D deficiency 11/27/2020  . Tremors of nervous system 11/20/2020  . Anxiety disorder 11/20/2020  . Memory loss 11/20/2020  . 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 Pneumonia due to COVID-19 virus 08/07/2020  . Foot sprain, left, subsequent encounter 05/16/2020  . Menopause 10/05/2019  . Palpitations 10/05/2019  . SOB (shortness of breath) 10/05/2019  . Cellulitis of face 08/28/2018  . CKD (chronic kidney disease), stage II 09/18/2016  . Gestational diabetes 05/18/2014  . Trigeminal neuralgia 05/11/2013  . GOITER 02/10/2009  . Essential hypertension 04/29/2007  . HEADACHE 04/29/2007    Dorena Bodo MS, [place], CBIS  01/16/2021, 9:13 AM  [facility] [address] [place], [place], [address] Phone: [phone]   Fax:  [phone]   Name: Cynthia Roach MRN: [id number] Date of Birth: 12-22-1970

## 2021-01-16 NOTE — Patient Instructions (Signed)

## 2021-01-16 NOTE — Therapy (Signed)
[facility] [address] [place], [place], [address] Phone: [phone]   Fax:  [phone]  Physical Therapy Treatment  Patient Details  Name: Cynthia Roach MRN: [id number] Date of Birth: 25-Apr-1971 Referring Provider (PT): Ivonne Andrew, NP   Encounter Date: 01/16/2021   PT End of Session - 01/16/21 0844    Visit Number 5    Number of Visits 17    Date for PT Re-Evaluation 02/23/21    Authorization Type [employer] - 90 visits combined total    PT Start Time 0845    PT Stop Time 0930    PT Time Calculation (min) 45 min    Activity Tolerance Patient tolerated treatment well    Behavior During Therapy [facility] for tasks assessed/performed           Past Medical History:  Diagnosis Date  . Chronic kidney disease    left ureteral stone  . Hypertension   . Ovarian cyst   . Seasonal allergies   . Trigeminal neuralgia     Past Surgical History:  Procedure Laterality Date  . CESAREAN SECTION    . CYSTOSCOPY WITH RETROGRADE PYELOGRAM, URETEROSCOPY AND STENT PLACEMENT Left 10/15/2020   Procedure: CYSTOSCOPY WITH RETROGRADE PYELOGRAM, URETEROSCOPY AND STENT PLACEMENT laser lithotrispsy and basket extraction;  Surgeon: Crist Fat, MD;  Location: [facility];  Service: Urology;  Laterality: Left;    There were no vitals filed for this visit.   Subjective Assessment - 01/16/21 0852    Subjective same vertigo, higher headache today. still with heart monitor on    Patient Stated Goals be able to drive more easily, walk to end of street or around neighborhood with granddaughter, be able to take laundry up or downstairs, not having family worry.    Currently in Pain? Yes    Pain Score 7     Pain Location Head                   Orthostatics: Supine:   Seated    Standing  115/77  126/74  99/70              [facility] Adult PT Treatment/Exercise - 01/16/21 0001      Neck Exercises: Machines for  Strengthening   Cybex Row 25# 2 x 10    Lat Pull 25# 2 x 10      Neck Exercises: Theraband   Horizontal ABduction 20 reps   yellow, seated     Lumbar Exercises: Seated   Sit to Stand --   2 x 10 STS with yellow med ball + OHP     Shoulder Exercises: Standing   Extension Strengthening;Both;20 reps   green   Row Strengthening;Both;20 reps   green     Modalities   Modalities Moist Heat      Moist Heat Therapy   Number Minutes Moist Heat 6 Minutes    Moist Heat Location Cervical      Manual Therapy   Manual Therapy Soft tissue mobilization;Myofascial release    Manual therapy comments gentle sub occipital release, UT/LS stretch, Deep pressure/sustained pressure at tender areas along right paraspinals cervical.           Vestibular Treatment/Exercise - 01/16/21 0001      Seated Horizontal Head Turns   Number of Reps  10   able to complete all without rest in between but with 6/10 dizziness, some nausea 3/10, headache 9/10   Symptom Description  trilaed x10 in reclined with dizzy 5/10, less intense      Seated Vertical Head Turns   Number of Reps  10                   PT Short Term Goals - 12/22/20 1145      PT SHORT TERM GOAL #1   Title Independent with initial HEP with reminders as needed to compensate for memory deficits    Time 2    Period Weeks    Status New    Target Date 01/05/21             PT Long Term Goals - 01/09/21 0928      PT LONG TERM GOAL #1   Title Pt will score at least 52/56 on berg balance scale with minimal to no dizziness, nausea, or headache exacerbations to demonstrate decreased falls risk.    Baseline initial 12/22/20: 39/56 with significant symptom exacrbation    Time 8    Period Weeks      PT LONG TERM GOAL #2   Title Pt will achieve score on and improve to [facility] for age without nausea,dizziness to facilitate functional ambulation in the home and community environments.    Baseline 01/09/21 initial: 900 feet (274.32 m).     Time 8    Period Weeks      PT LONG TERM GOAL #3   Title Pt will demonstrate improved BLE strength to at least 4+/5 and improved coordination quality of BLE.    Time 8    Period Weeks      PT LONG TERM GOAL #4   Title Pt will be able to negotiate up/down stairs while holding objects to facilitate independence with laundry in the home.    Time 8    Period Weeks      PT LONG TERM GOAL #5   Title Pt will report at least 50% reduction in symptoms    Time 8    Period Weeks                 Plan - 01/16/21 7371    Clinical Impression Statement Pt with increased HA today but was able to tolerate strength exercises follow by minimal vestibular exercises fairly although with symptom exacerbation. Noted that HA/dizziness decreases in supported relcined position, possible cervicogenic component as this calmed with manual tx and moist heat. She did report today that she "sees stars" sometimes  when getting up in bed or bending over and standing up. Assessed orthostatics and BP drop identified from seated to standing. Educated pt in mms pumping activities to help support BP stability before getting into standing, pt may benefit from use of compression stockings as well to facilitate BP stability with functional mobility    Personal Factors and Comorbidities Comorbidity 3+;Age;Time since onset of injury/illness/exacerbation    Comorbidities insomnia, tremors, long haul post COVID symptoms, hx trigeminal neuralgia right side of face, history of multiple concussions    PT Treatment/Interventions ADLs/Self Care Home Management;Cryotherapy;Electrical Stimulation;Iontophoresis 4mg /ml Dexamethasone;Moist Heat;Gait training;Functional mobility training;Therapeutic activities;Therapeutic exercise;Balance training;Neuromuscular re-education;Patient/family education;Manual techniques;Dry needling;Energy conservation;Vasopneumatic Device;Vestibular;Visual/perceptual remediation/compensation    PT Next Visit  Plan Progress vestibular, recheck and perform Epley if needed. brock strings, NPC training next visit as tolerated (deferred for today with focus on HA,    PT Home Exercise Plan Access Code: Gilberto Better    Consulted and Agree with Plan of Care Patient  Patient will benefit from skilled therapeutic intervention in order to improve the following deficits and impairments:  Abnormal gait,Decreased strength,Decreased activity tolerance,Dizziness,Decreased balance,Pain,Decreased coordination,Impaired vision/preception,Decreased endurance  Visit Diagnosis: Repeated falls  Muscle weakness (generalized)  Unsteadiness on feet  Dizziness and giddiness  Pain in extremity, unspecified extremity  Cramp and spasm     Problem List Patient Active Problem List   Diagnosis Date Noted  . Shortness of breath 11/27/2020  . Cough 11/27/2020  . Anxiety 11/27/2020  . History of COVID-19 11/27/2020  . Stutter 11/27/2020  . Tremor 11/27/2020  . Insomnia 11/27/2020  . Vertigo 11/27/2020  . Physical deconditioning 11/27/2020  . Vitamin D deficiency 11/27/2020  . Tremors of nervous system 11/20/2020  . Anxiety disorder 11/20/2020  . Memory loss 11/20/2020  . 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 Pneumonia due to COVID-19 virus 08/07/2020  . Foot sprain, left, subsequent encounter 05/16/2020  . Menopause 10/05/2019  . Palpitations 10/05/2019  . SOB (shortness of breath) 10/05/2019  . Cellulitis of face 08/28/2018  . CKD (chronic kidney disease), stage II 09/18/2016  . Gestational diabetes 05/18/2014  . Trigeminal neuralgia 05/11/2013  . GOITER 02/10/2009  . Essential hypertension 04/29/2007  . HEADACHE 04/29/2007    Tekeya Geffert L Jasalyn Frysinger 01/16/2021, 10:03 AM  [facility] [address] [place], [place], [address] Phone: [phone]   Fax:  [phone]  Name: Afreen Siebels MRN: [id number] Date of Birth: [birth year]

## 2021-01-18 ENCOUNTER — Encounter: Payer: Self-pay | Admitting: Pulmonary Disease

## 2021-01-18 ENCOUNTER — Other Ambulatory Visit: Payer: Self-pay

## 2021-01-18 ENCOUNTER — Ambulatory Visit: Payer: 59 | Admitting: Pulmonary Disease

## 2021-01-18 VITALS — BP 118/76 | HR 64 | Temp 98.0°F | Ht 66.0 in | Wt 169.0 lb

## 2021-01-18 DIAGNOSIS — U099 Post covid-19 condition, unspecified: Secondary | ICD-10-CM

## 2021-01-18 DIAGNOSIS — R0602 Shortness of breath: Secondary | ICD-10-CM

## 2021-01-18 DIAGNOSIS — R2990 Unspecified symptoms and signs involving the nervous system: Secondary | ICD-10-CM

## 2021-01-18 MED ORDER — BREO ELLIPTA 100-25 MCG/INH IN AEPB
1.0000 | INHALATION_SPRAY | Freq: Every day | RESPIRATORY_TRACT | 3 refills | Status: AC
Start: 2021-01-18 — End: ?

## 2021-01-18 NOTE — Progress Notes (Signed)
Cynthia Roach    [id number]    20-Oct-1970  Primary Care Physician:Fry, Tera Mater, MD  Referring Physician: Ivonne Andrew, NP [address] [place],  [place] [address]  Chief complaint:   Patient with a history of COVID infection in December With persistent symptoms of shortness of breath, wheezing, cough  HPI:  Patient contracted COVID December 2021 Hospitalized for 5 days Was on oxygen while she was in the hospital  Having persistent symptoms  Had a CT scan of the abdomen in February 2022 showing bibasal infiltrative process  She continues to feel short of breath with minimal exertion, continues to cough on a regular basis Wheezing with activity  Symptoms are minimally better but still has significant symptoms to impact and limit daily activities  No previous history of asthma No family history of asthma No significant history of lung disease  Never smoker  Outpatient Encounter Medications as of 01/18/2021  Medication Sig  . albuterol (VENTOLIN HFA) 108 (90 Base) MCG/ACT inhaler Inhale 2 puffs into the lungs every 6 (six) hours as needed for wheezing or shortness of breath.  Marland Kitchen amLODipine (NORVASC) 10 MG tablet Take 1 tablet (10 mg total) by mouth daily.  Marland Kitchen amphetamine-dextroamphetamine (ADDERALL XR) 10 MG 24 hr capsule Take 1 capsule (10 mg total) by mouth daily.  . carbamazepine (CARBATROL) 300 MG 12 hr capsule Take 1 capsule (300 mg total) by mouth 2 (two) times daily.  Marland Kitchen EPINEPHrine (EPIPEN 2-PAK) 0.3 mg/0.3 mL IJ SOAJ injection Inject 0.3 mLs (0.3 mg total) into the muscle once.  . escitalopram (LEXAPRO) 10 MG tablet Take 1 tablet (10 mg total) by mouth daily.  Marland Kitchen gabapentin (NEURONTIN) 300 MG capsule Take 300 mg by mouth 3 (three) times daily.  Marland Kitchen latanoprost (XALATAN) 0.005 % ophthalmic solution Place 1 drop into both eyes at bedtime.  Marland Kitchen losartan-hydrochlorothiazide (HYZAAR) 100-25 MG tablet Take 1 tablet by mouth daily.  . metoprolol succinate  (TOPROL-XL) 50 MG 24 hr tablet Take 1 tablet (50 mg total) by mouth daily. Take with or immediately following a meal.  . montelukast (SINGULAIR) 10 MG tablet Take 1 tablet (10 mg total) by mouth at bedtime.  Marland Kitchen omeprazole (PRILOSEC OTC) 20 MG tablet Take 1 tablet (20 mg total) by mouth daily.  . phenazopyridine (PYRIDIUM) 200 MG tablet Take 1 tablet (200 mg total) by mouth 3 (three) times daily as needed for pain.  . traMADol (ULTRAM) 50 MG tablet Take 1-2 tablets (50-100 mg total) by mouth every 6 (six) hours as needed for moderate pain.  Marland Kitchen tretinoin (RETIN-A) 0.05 % cream Apply 1 application topically at bedtime.  . predniSONE (DELTASONE) 10 MG tablet Take 4 tabs for 2 days, then 3 tabs for 2 days, then 2 tabs for 2 days, then 1 tab for 2 days, then stop (Patient not taking: Reported on 01/18/2021)  . zolpidem (AMBIEN) 10 MG tablet Take 1 tablet (10 mg total) by mouth at bedtime as needed for sleep. (Patient not taking: Reported on 12/04/2020)   No facility-administered encounter medications on file as of 01/18/2021.    Allergies as of 01/18/2021 - Review Complete 01/18/2021  Allergen Reaction Noted  . Oxycodone Hives and Anaphylaxis 02/18/2012  . Shellfish allergy Anaphylaxis and Hives 01/14/2012  . Amoxicillin Other (See Comments) 12/22/2012  . Lisinopril Cough 08/28/2018  . Morphine and related  05/11/2013    Past Medical History:  Diagnosis Date  . Chronic kidney disease  left ureteral stone  . Hypertension   . Ovarian cyst   . Seasonal allergies   . Trigeminal neuralgia     Past Surgical History:  Procedure Laterality Date  . CESAREAN SECTION    . CYSTOSCOPY WITH RETROGRADE PYELOGRAM, URETEROSCOPY AND STENT PLACEMENT Left 10/15/2020   Procedure: CYSTOSCOPY WITH RETROGRADE PYELOGRAM, URETEROSCOPY AND STENT PLACEMENT laser lithotrispsy and basket extraction;  Surgeon: Crist Fat, MD;  Location: [facility];  Service: Urology;  Laterality: Left;    Family History  Problem  Relation Age of Onset  . Alcohol abuse Other   . Arthritis Other   . Breast cancer Other   . Colon cancer Other   . Diabetes Other   . Hyperlipidemia Other   . Hypertension Other   . Prostate cancer Other   . Depression Other   . Stroke Other   . Sudden death Other   . Coronary artery disease Other     Social History   Socioeconomic History  . Marital status: Married    Spouse name: Not on file  . Number of children: Not on file  . Years of education: Not on file  . Highest education level: Not on file  Occupational History  . Not on file  Tobacco Use  . Smoking status: Never Smoker  . Smokeless tobacco: Never Used  Substance and Sexual Activity  . Alcohol use: No    Alcohol/week: 0.0 standard drinks  . Drug use: No  . Sexual activity: Not on file  Other Topics Concern  . Not on file  Social History Narrative  . Not on file   Social Determinants of Health   Financial Resource Strain: Not on file  Food Insecurity: Not on file  Transportation Needs: Not on file  Physical Activity: Not on file  Stress: Not on file  Social Connections: Not on file  Intimate Partner Violence: Not on file    Review of Systems  Constitutional: Positive for fatigue.  Respiratory: Positive for cough and shortness of breath.     Vitals:   01/18/21 0934  BP: 118/76  Pulse: 64  Temp: 98 F (36.7 C)  SpO2: 99%     Physical Exam Constitutional:      Appearance: She is obese.  HENT:     Head: Normocephalic.     Mouth/Throat:     Mouth: Mucous membranes are moist.  Eyes:     General:        Right eye: No discharge.        Left eye: No discharge.  Cardiovascular:     Rate and Rhythm: Normal rate and regular rhythm.     Heart sounds: No murmur heard. No friction rub.  Pulmonary:     Effort: No respiratory distress.     Breath sounds: No stridor. No wheezing or rhonchi.  Musculoskeletal:     Cervical back: No rigidity or tenderness.  Neurological:     Mental Status:  She is alert.  Psychiatric:        Mood and Affect: Mood normal.    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

## 2021-01-18 NOTE — Patient Instructions (Signed)
We will call you inhalers including albuterol to be used up to 4 times a day as needed  Breo to be used daily  We will get a CT scan of your chest to see extent of haziness/scarring in the lungs  We will obtain a breathing study to evaluate lung function  I will see you back in 6 to 8 weeks  Call with significant concerns

## 2021-01-19 ENCOUNTER — Ambulatory Visit: Payer: 59 | Admitting: Rehabilitative and Restorative Service Providers"

## 2021-01-19 ENCOUNTER — Ambulatory Visit: Payer: 59 | Admitting: Speech Pathology

## 2021-01-24 ENCOUNTER — Telehealth: Payer: Self-pay | Admitting: Nurse Practitioner

## 2021-01-24 ENCOUNTER — Ambulatory Visit (INDEPENDENT_AMBULATORY_CARE_PROVIDER_SITE_OTHER): Payer: 59 | Admitting: Nurse Practitioner

## 2021-01-24 VITALS — BP 125/78 | HR 67 | Temp 97.9°F | Resp 18

## 2021-01-24 DIAGNOSIS — R5381 Other malaise: Secondary | ICD-10-CM

## 2021-01-24 DIAGNOSIS — E559 Vitamin D deficiency, unspecified: Secondary | ICD-10-CM

## 2021-01-24 DIAGNOSIS — F8081 Childhood onset fluency disorder: Secondary | ICD-10-CM

## 2021-01-24 DIAGNOSIS — Z8616 Personal history of COVID-19: Secondary | ICD-10-CM | POA: Diagnosis not present

## 2021-01-24 DIAGNOSIS — H5022 Vertical strabismus, left eye: Secondary | ICD-10-CM

## 2021-01-24 DIAGNOSIS — R0602 Shortness of breath: Secondary | ICD-10-CM

## 2021-01-24 DIAGNOSIS — G8929 Other chronic pain: Secondary | ICD-10-CM

## 2021-01-24 DIAGNOSIS — R519 Headache, unspecified: Secondary | ICD-10-CM

## 2021-01-24 LAB — COMPREHENSIVE METABOLIC PANEL
ALT: 16 IU/L (ref 0–32)
AST: 19 IU/L (ref 0–40)
Albumin/Globulin Ratio: 1.8 (ref 1.2–2.2)
Albumin: 4.4 g/dL (ref 3.8–4.8)
Alkaline Phosphatase: 157 IU/L — ABNORMAL HIGH (ref 44–121)
BUN/Creatinine Ratio: 10 (ref 9–23)
BUN: 11 mg/dL (ref 6–24)
Bilirubin Total: 0.3 mg/dL (ref 0.0–1.2)
CO2: 25 mmol/L (ref 20–29)
Calcium: 9.7 mg/dL (ref 8.7–10.2)
Chloride: 98 mmol/L (ref 96–106)
Creatinine, Ser: 1.06 mg/dL — ABNORMAL HIGH (ref 0.57–1.00)
Globulin, Total: 2.5 g/dL (ref 1.5–4.5)
Glucose: 123 mg/dL — ABNORMAL HIGH (ref 65–99)
Potassium: 3.5 mmol/L (ref 3.5–5.2)
Sodium: 140 mmol/L (ref 134–144)
Total Protein: 6.9 g/dL (ref 6.0–8.5)
eGFR: 65 mL/min/{1.73_m2} (ref >59)

## 2021-01-24 LAB — VITAMIN D 25 HYDROXY (VIT D DEFICIENCY, FRACTURES): Vit D, 25-Hydroxy: 29.8 ng/mL — ABNORMAL LOW (ref 30.0–100.0)

## 2021-01-24 LAB — CBC
Hematocrit: 33.9 % — ABNORMAL LOW (ref 34.0–46.6)
Hemoglobin: 11.2 g/dL (ref 11.1–15.9)
MCH: 30.5 pg (ref 26.6–33.0)
MCHC: 33 g/dL (ref 31.5–35.7)
MCV: 92 fL (ref 79–97)
Platelets: 331 10*3/uL (ref 150–450)
RBC: 3.67 x10E6/uL — ABNORMAL LOW (ref 3.77–5.28)
RDW: 13.2 % (ref 11.7–15.4)
WBC: 6.9 10*3/uL (ref 3.4–10.8)

## 2021-01-24 LAB — SEDIMENTATION RATE: Sed Rate: 17 mm/hr (ref 0–32)

## 2021-01-24 LAB — C-REACTIVE PROTEIN: CRP: 5 mg/L (ref 0–10)

## 2021-01-24 NOTE — Patient Instructions (Addendum)
History of COVID-19 Cough Shortness of breath:   Stay well hydrated  Stay active  Deep breathing exercises  May take tylenol for fever or pain  Will order albuterol  Will start on Breo per pulmonary  Continue to follow with pulmonary   Anxiety:  Will place referral to psychiatry  Continue Lexapro as prescribed by PCP   Vertigo Trigeminal neuralgia Memory loss Tremors Stutter Insomnia Physical deconditioning Joint pain:  Continue PT - vertigo and deconditioning / tremors, joint pain  Continue Speech - cognitive rehabilitation / stutter   Keep upcoming appointment with neurology   Vitamin D deficiency:  May start vitamin D3 2,000 IU daily   Note: Patient not feeling well today - dizzy, unsteady, headache, changes to left eye  Discussed with patient - considering that she is feeling much worse today that would be advised that she go into the emergency room at [facility] to be evaluated for possible stroke.  Patient is agreeable.  Report called to ED triage nurse.  Husband to transport.   Follow up:  Follow up in 3 months or sooner if needed

## 2021-01-24 NOTE — Progress Notes (Signed)
@Patient  ID: , female    DOB: 1971-04-15, 50 y.o.   MRN: [id number]  Chief Complaint  Patient presents with  . Follow-up    Referring provider: [id number], MD   HPI   Patient presents today for post-COVID care clinic visit follow-up.  Since her last visit here patient has participated in speech therapy for cognitive rehabilitation and physical therapy for vestibular rehabilitation.  She has also been seen by pulmonary.  She will be starting on Breo today.  She is scheduled for a follow-up chest CT and PFT.  Patient is scheduled to see neurology that she was not able to get appointment till later this month.  She states that she is not doing well today.  She is very dizzy.  She complains of multiple falls since her last visit here.  She is having left-sided headache with left strabismus noted.  Also noted upon exam today with slight left-sided weakness to her left hand grip.  Vital signs are stable.  Discussed with patient considering that she is feeling much worse today that would be advised that she go into the emergency room at [facility] to be evaluated for possible stroke.  Patient is agreeable.  Report called to ED triage nurse.  Husband to transport. Denies f/c/s, n/v/d, hemoptysis, PND, chest pain or edema.     Allergies  Allergen Reactions  . Oxycodone Hives and Anaphylaxis    Had hives and shortness of breath Hives Other reaction(s): Hives Had hives and shortness of breath  . Shellfish Allergy Anaphylaxis and Hives  . Amoxicillin Other (See Comments)    Burns when urinating Burns when urinating  . Lisinopril Cough  . Morphine And Related     SOB, rash & hives    Immunization History  Administered Date(s) Administered  . Influenza Split 06/28/2013  . PFIZER(Purple Top)SARS-COV-2 Vaccination 02/26/2020, 05/16/2020    Past Medical History:  Diagnosis Date  . Chronic kidney disease    left ureteral stone  . Hypertension   . Ovarian cyst   .  Seasonal allergies   . Trigeminal neuralgia     Tobacco History: Social History   Tobacco Use  Smoking Status Never Smoker  Smokeless Tobacco Never Used   Counseling given: Yes   Outpatient Encounter Medications as of 01/24/2021  Medication Sig  . albuterol (VENTOLIN HFA) 108 (90 Base) MCG/ACT inhaler Inhale 2 puffs into the lungs every 6 (six) hours as needed for wheezing or shortness of breath.  03/26/2021 amLODipine (NORVASC) 10 MG tablet Take 1 tablet (10 mg total) by mouth daily.  Marland Kitchen amphetamine-dextroamphetamine (ADDERALL XR) 10 MG 24 hr capsule Take 1 capsule (10 mg total) by mouth daily.  . carbamazepine (CARBATROL) 300 MG 12 hr capsule Take 1 capsule (300 mg total) by mouth 2 (two) times daily.  Marland Kitchen EPINEPHrine (EPIPEN 2-PAK) 0.3 mg/0.3 mL IJ SOAJ injection Inject 0.3 mLs (0.3 mg total) into the muscle once.  . escitalopram (LEXAPRO) 10 MG tablet Take 1 tablet (10 mg total) by mouth daily.  . fluticasone furoate-vilanterol (BREO ELLIPTA) 100-25 MCG/INH AEPB Inhale 1 puff into the lungs daily.  Marland Kitchen gabapentin (NEURONTIN) 300 MG capsule Take 300 mg by mouth 3 (three) times daily.  Marland Kitchen latanoprost (XALATAN) 0.005 % ophthalmic solution Place 1 drop into both eyes at bedtime.  Marland Kitchen losartan-hydrochlorothiazide (HYZAAR) 100-25 MG tablet Take 1 tablet by mouth daily.  . metoprolol succinate (TOPROL-XL) 50 MG 24 hr tablet Take 1 tablet (50 mg total) by mouth  daily. Take with or immediately following a meal.  . montelukast (SINGULAIR) 10 MG tablet Take 1 tablet (10 mg total) by mouth at bedtime.  Marland Kitchen omeprazole (PRILOSEC OTC) 20 MG tablet Take 1 tablet (20 mg total) by mouth daily.  . phenazopyridine (PYRIDIUM) 200 MG tablet Take 1 tablet (200 mg total) by mouth 3 (three) times daily as needed for pain.  . predniSONE (DELTASONE) 10 MG tablet Take 4 tabs for 2 days, then 3 tabs for 2 days, then 2 tabs for 2 days, then 1 tab for 2 days, then stop (Patient not taking: Reported on 01/18/2021)  . traMADol (ULTRAM)  50 MG tablet Take 1-2 tablets (50-100 mg total) by mouth every 6 (six) hours as needed for moderate pain.  Marland Kitchen tretinoin (RETIN-A) 0.05 % cream Apply 1 application topically at bedtime.  Marland Kitchen zolpidem (AMBIEN) 10 MG tablet Take 1 tablet (10 mg total) by mouth at bedtime as needed for sleep. (Patient not taking: Reported on 12/04/2020)   No facility-administered encounter medications on file as of 01/24/2021.     Review of Systems  Review of Systems  Constitutional: Negative.   HENT: Negative.   Respiratory: Negative for cough and shortness of breath.   Cardiovascular: Negative.   Gastrointestinal: Negative.   Allergic/Immunologic: Negative.   Neurological: Positive for dizziness, weakness (left side), light-headedness and headaches.  Psychiatric/Behavioral: Negative.        Physical Exam  BP 125/78   Pulse 67   Temp 97.9 F (36.6 C)   Resp 18   SpO2 100%   Wt Readings from Last 5 Encounters:  01/18/21 169 lb (76.7 kg)  12/04/20 166 lb 12.8 oz (75.7 kg)  11/20/20 171 lb (77.6 kg)  10/15/20 162 lb (73.5 kg)  09/27/20 173 lb 9.6 oz (78.7 kg)     Physical Exam Vitals and nursing note reviewed.  Constitutional:      Appearance: She is well-developed. She is ill-appearing.  Eyes:     Comments:  left strabismus noted  Cardiovascular:     Rate and Rhythm: Normal rate and regular rhythm.  Pulmonary:     Effort: Pulmonary effort is normal.     Breath sounds: Normal breath sounds.  Neurological:     Mental Status: She is alert and oriented to person, place, and time.     Motor: Weakness present.     Gait: Gait abnormal.     Comments: Unequal grip to left hand  Psychiatric:        Mood and Affect: Mood normal.      Lab Results:  CBC    Component Value Date/Time   WBC 6.9 01/23/2021 1129   WBC 14.4 (H) 10/15/2020 0209   RBC 3.67 (L) 01/23/2021 1129   RBC 3.53 (L) 10/15/2020 0209   HGB 11.2 01/23/2021 1129   HCT 33.9 (L) 01/23/2021 1129   PLT 331 01/23/2021 1129    MCV 92 01/23/2021 1129   MCH 30.5 01/23/2021 1129   MCH 31.7 10/15/2020 0209   MCHC 33.0 01/23/2021 1129   MCHC 33.5 10/15/2020 0209   RDW 13.2 01/23/2021 1129   LYMPHSABS 1.1 09/01/2020 1102   MONOABS 0.7 09/01/2020 1102   EOSABS 0.1 09/01/2020 1102   BASOSABS 0.0 09/01/2020 1102    BMET    Component Value Date/Time   NA 140 01/23/2021 1129   K 3.5 01/23/2021 1129   CL 98 01/23/2021 1129   CO2 25 01/23/2021 1129   GLUCOSE 123 (H) 01/23/2021 1129   GLUCOSE  133 (H) 10/15/2020 0209   BUN 11 01/23/2021 1129   CREATININE 1.06 (H) 01/23/2021 1129   CALCIUM 9.7 01/23/2021 1129   GFRNONAA 48 (L) 10/15/2020 0209   GFRAA >60 09/15/2016 2005      Assessment & Plan:   History of COVID-19 Cough Shortness of breath:   Stay well hydrated  Stay active  Deep breathing exercises  May take tylenol for fever or pain  Will order albuterol  Will start on Breo per pulmonary  Continue to follow with pulmonary   Anxiety:  Will place referral to psychiatry  Continue Lexapro as prescribed by PCP   Vertigo Trigeminal neuralgia Memory loss Tremors Stutter Insomnia Physical deconditioning Joint pain:  Continue PT - vertigo and deconditioning / tremors, joint pain  Continue Speech - cognitive rehabilitation / stutter   Keep upcoming appointment with neurology   Vitamin D deficiency:  May start vitamin D3 2,000 IU daily   Note: Patient not feeling well today - dizzy, unsteady, headache, changes to left eye  Discussed with patient - considering that she is feeling much worse today that would be advised that she go into the emergency room at [facility] to be evaluated for possible stroke.  Patient is agreeable.  Report called to ED triage nurse.  Husband to transport.   Follow up:  Follow up in 3 months or sooner if needed      Ivonne Andrew, NP 01/24/2021

## 2021-01-24 NOTE — Telephone Encounter (Signed)
Patient called back to office and stated that she is not going to the ED as advised. She states that she is feeling better. Advised that if symptoms return or worsen to please go straight to the ED.

## 2021-01-24 NOTE — Assessment & Plan Note (Signed)
Cough Shortness of breath:   Stay well hydrated  Stay active  Deep breathing exercises  May take tylenol for fever or pain  Will order albuterol  Will start on Breo per pulmonary  Continue to follow with pulmonary   Anxiety:  Will place referral to psychiatry  Continue Lexapro as prescribed by PCP   Vertigo Trigeminal neuralgia Memory loss Tremors Stutter Insomnia Physical deconditioning Joint pain:  Continue PT - vertigo and deconditioning / tremors, joint pain  Continue Speech - cognitive rehabilitation / stutter   Keep upcoming appointment with neurology   Vitamin D deficiency:  May start vitamin D3 2,000 IU daily   Note: Patient not feeling well today - dizzy, unsteady, headache, changes to left eye  Discussed with patient - considering that she is feeling much worse today that would be advised that she go into the emergency room at [facility] to be evaluated for possible stroke.  Patient is agreeable.  Report called to ED triage nurse.  Husband to transport.   Follow up:  Follow up in 3 months or sooner if needed

## 2021-01-25 ENCOUNTER — Other Ambulatory Visit: Payer: Self-pay

## 2021-01-25 ENCOUNTER — Encounter: Payer: Self-pay | Admitting: Speech Pathology

## 2021-01-25 ENCOUNTER — Ambulatory Visit: Payer: 59 | Attending: Nurse Practitioner | Admitting: Speech Pathology

## 2021-01-25 DIAGNOSIS — R41841 Cognitive communication deficit: Secondary | ICD-10-CM | POA: Diagnosis not present

## 2021-01-25 DIAGNOSIS — R251 Tremor, unspecified: Secondary | ICD-10-CM | POA: Diagnosis not present

## 2021-01-25 DIAGNOSIS — F985 Adult onset fluency disorder: Secondary | ICD-10-CM | POA: Diagnosis not present

## 2021-01-25 DIAGNOSIS — Z8616 Personal history of COVID-19: Secondary | ICD-10-CM | POA: Insufficient documentation

## 2021-01-25 DIAGNOSIS — R413 Other amnesia: Secondary | ICD-10-CM | POA: Insufficient documentation

## 2021-01-25 NOTE — Patient Instructions (Signed)
Report to ED or Doc if you continue to feel poorly.

## 2021-01-25 NOTE — Therapy (Signed)
[facility] [address] [place], [place], [address] Phone: [phone]   Fax:  [phone]  Speech Language Pathology Treatment  Patient Details  Name: Cynthia Roach MRN: [id number] Date of Birth: 09/04/1970 Referring Provider (SLP): Angus Seller NP   Encounter Date: 01/25/2021   End of Session - 01/25/21 0841    Visit Number 6    Number of Visits 17    Date for SLP Re-Evaluation 02/04/21    SLP Start Time 0815   Pt was late   SLP Stop Time  0845    SLP Time Calculation (min) 30 min    Activity Tolerance Patient tolerated treatment well           Past Medical History:  Diagnosis Date  . Chronic kidney disease    left ureteral stone  . Hypertension   . Ovarian cyst   . Seasonal allergies   . Trigeminal neuralgia     Past Surgical History:  Procedure Laterality Date  . CESAREAN SECTION    . CYSTOSCOPY WITH RETROGRADE PYELOGRAM, URETEROSCOPY AND STENT PLACEMENT Left 10/15/2020   Procedure: CYSTOSCOPY WITH RETROGRADE PYELOGRAM, URETEROSCOPY AND STENT PLACEMENT laser lithotrispsy and basket extraction;  Surgeon: Crist Fat, MD;  Location: [facility];  Service: Urology;  Laterality: Left;    There were no vitals filed for this visit.   Subjective Assessment - 01/25/21 0816    Subjective Pt reports she isn't feeling well and that she has fallen twice this week. She reported she hit her head one time on the nightstand the first and then fell a second time. Since then, you've increased tiredness, increased dizziness, and some weakness (noted in yesterday's Covid Clinic note). SLP reiterated that pt should go to the ED or Doc if this continues. Pt reported that she could not take her medicines for 6 days because she was watching a 49 yo and she didn't want to take medicines that make her drowsy. She reported she started back her medications on Tuesday.    Currently in Pain? Yes    Pain Score 3     Pain Location  Head    Pain Descriptors / Indicators Throbbing    Pain Type Chronic pain    Pain Onset More than a month ago    Pain Frequency Constant    Pain Relieving Factors Waiting to take medication until she's eaten.                 ADULT SLP TREATMENT - 01/25/21 0839      General Information   Behavior/Cognition Alert;Cooperative      Treatment Provided   Treatment provided Cognitive-Linquistic      Cognitive-Linquistic Treatment   Treatment focused on Cognition    Skilled Treatment Facilitated attention and reading comprehension using "real life situations". Pt was able to complete with minA from SLP - occasional verbal cues. Reports that math has been getting easier.      Assessment / Recommendations / Plan   Plan Continue with current plan of care      Progression Toward Goals   Progression toward goals Progressing toward goals              SLP Short Term Goals - 01/25/21 0844      SLP SHORT TERM GOAL #1   Title Pt will identify 3 memory strategies to assist with recalling information.    Time 2    Period Weeks    Status On-going  SLP SHORT TERM GOAL #2   Title Pt will identify 3 attention strategies to assist with reducing cognitive fatigue.    Time 2    Period Weeks    Status On-going      SLP SHORT TERM GOAL #3   Title Further assessment of language skills warranted.    Time 1    Period Weeks    Status Achieved            SLP Long Term Goals - 01/25/21 0844      SLP LONG TERM GOAL #1   Title Patient will demonstrate use of memory strategies to schedule activities, recall weekly events and items to maintain safety to participate socially in functional living environment    Time 6    Period Weeks    Status On-going      SLP LONG TERM GOAL #2   Title Patient will develop functional attention skills to effectively attend to and communicate in tasks of daily living in their functional living environment.    Time 6    Period Weeks    Status  On-going            Plan - 01/25/21 0109    Clinical Impression Statement Pt required min-to-modA with functional attention and reading comprehension exercises. Requires increased time for processing.    Speech Therapy Frequency 2x / week    Duration 8 weeks    Treatment/Interventions Compensatory strategies;Cueing hierarchy;Functional tasks;Patient/family education;Environmental controls;Cognitive reorganization;Multimodal communcation approach;Language facilitation;Compensatory techniques;Internal/external aids;SLP instruction and feedback    Potential Considerations Ability to learn/carryover information    Consulted and Agree with Plan of Care Patient;Family member/caregiver    Family Member Consulted [name]- Husband           Patient will benefit from skilled therapeutic intervention in order to improve the following deficits and impairments:   Cognitive communication deficit    Problem List Patient Active Problem List   Diagnosis Date Noted  . Vertical strabismus of left eye 01/24/2021  . Shortness of breath 11/27/2020  . Cough 11/27/2020  . Anxiety 11/27/2020  . History of COVID-19 11/27/2020  . Stutter 11/27/2020  . Tremor 11/27/2020  . Insomnia 11/27/2020  . Vertigo 11/27/2020  . Physical deconditioning 11/27/2020  . Vitamin D deficiency 11/27/2020  . Tremors of nervous system 11/20/2020  . Anxiety disorder 11/20/2020  . Memory loss 11/20/2020  . 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 Pneumonia due to COVID-19 virus 08/07/2020  . Foot sprain, left, subsequent encounter 05/16/2020  . Menopause 10/05/2019  . Palpitations 10/05/2019  . SOB (shortness of breath) 10/05/2019  . Cellulitis of face 08/28/2018  . CKD (chronic kidney disease), stage II 09/18/2016  . Gestational diabetes 05/18/2014  . Trigeminal neuralgia 05/11/2013  . GOITER 02/10/2009  . Essential hypertension 04/29/2007  . Headache 04/29/2007    Dorena Bodo MS, [place], CBIS  01/25/2021, 8:45 AM  [facility] [address] [place], [place], [address] Phone: [phone]   Fax:  [phone]   Name: Cynthia Roach MRN: [id number] Date of Birth: 09-12-1970

## 2021-02-02 ENCOUNTER — Ambulatory Visit: Payer: 59 | Admitting: Speech Pathology

## 2021-02-02 ENCOUNTER — Ambulatory Visit: Payer: 59 | Admitting: Rehabilitative and Restorative Service Providers"

## 2021-02-05 ENCOUNTER — Ambulatory Visit: Payer: 59 | Admitting: Rehabilitative and Restorative Service Providers"

## 2021-02-05 ENCOUNTER — Ambulatory Visit: Payer: 59 | Admitting: Speech Pathology

## 2021-02-15 ENCOUNTER — Encounter: Payer: Self-pay | Admitting: Neurology

## 2021-02-15 ENCOUNTER — Telehealth: Payer: Self-pay | Admitting: Neurology

## 2021-02-15 ENCOUNTER — Ambulatory Visit: Payer: 59 | Admitting: Neurology

## 2021-02-15 VITALS — BP 109/69 | HR 59 | Temp 97.0°F | Ht 64.0 in | Wt 166.0 lb

## 2021-02-15 DIAGNOSIS — G500 Trigeminal neuralgia: Secondary | ICD-10-CM | POA: Diagnosis not present

## 2021-02-15 DIAGNOSIS — U099 Post covid-19 condition, unspecified: Secondary | ICD-10-CM | POA: Diagnosis not present

## 2021-02-15 NOTE — Progress Notes (Signed)
Chief Complaint  Patient presents with   Hx of Covid    Rm 16 New Pt husband- Cynthia Roach issues, MMSE 19      ASSESSMENT AND PLAN  Cynthia Roach is a 50 y.o. 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   HISTORICAL  Cynthia Roach is a 50 year old female, seen in request by her primary care nurse practitioner Cynthia Roach, and Dr. Nelwyn Roach, for evaluation significant decline in function since COVID infection in December 2021, patient is accompanied by her husband at today's visit on February 15, 2021, she is tearful during visit.  I reviewed and summarized the referring note.PMHX. Hypertension ADHD, adderall xr 10mg  daily, since COVID Aug 07 2020. RightTrigeminal neuralgia,  Depression, anxiety Joint pain, tramadol.  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  Patient was started on 5 days of remdesivir and steroids, Decadron upon discharge.  Also was  treated with Rocephin and azithromycin, mild elevated AST,  Respiratory symptoms improved after few weeks, but patient functional status has changed significantly since then, now she complains of brain fog, could not remember things, forgot to turn off the stove while cooking, also become very emotional, tearful, was not able to go back to work  She also complains of frequent dizziness, headache, poor appetite   PHYSICAL EXAM:   Vitals:   02/15/21 0805  BP: 109/69  Pulse: (!) 59  Temp: (!) 97 F (36.1 C)  Weight: 166 lb (75.3 kg)  Height: 5\' 4"  (1.626 m)   Not recorded     Body mass index is 28.49 kg/m.  PHYSICAL EXAMNIATION:  Gen: NAD, conversant, well nourised, well groomed                     Cardiovascular: Regular rate rhythm, no peripheral edema, warm, nontender. Eyes: Conjunctivae clear without exudates or hemorrhage Neck: Supple, no carotid bruits. Pulmonary: Clear to auscultation bilaterally   NEUROLOGICAL EXAM:  MENTAL STATUS:  Speech:    Speech is normal; fluent and spontaneous with normal comprehension. But tearful,  Cognition:  MMSE - Mini Mental State Exam 02/15/2021  Orientation to time 3  Orientation to Place 3  Registration 3  Attention/ Calculation 1  Recall 0  Language- name 2 objects 2  Language- repeat 1  Language- follow 3 step command 3  Language- read & follow direction 1  Write a sentence 1  Copy design 1  Total score 19      CRANIAL NERVES: CN II: Visual fields are full to confrontation. Pupils  are round equal and briskly reactive to light. CN III, IV, VI: extraocular movement are normal. No ptosis. CN V: Facial sensation is intact to light touch CN VII: Face is symmetric with normal eye closure  CN VIII: Hearing is normal to causal conversation. CN IX, X: Phonation is normal. CN XI: Head turning and shoulder shrug are intact  MOTOR: There is no pronator drift of out-stretched arms. Muscle bulk and tone are normal. Muscle  strength is normal.  REFLEXES: Reflexes are 2+ and symmetric at the biceps, triceps, knees, and ankles. Plantar responses are flexor.  SENSORY: Intact to light touch, pinprick and vibratory sensation are intact in fingers and toes.  COORDINATION: There is no trunk or limb dysmetria noted.  GAIT/STANCE: Posture is normal. Gait is steady with normal steps, base, arm swing, and turning. Heel and toe walking are normal. Tandem gait is normal.  Romberg is absent.  REVIEW OF SYSTEMS:  Full 14 system review of systems performed and notable only for as above All other review of systems were negative.   ALLERGIES: Allergies  Allergen Reactions   Oxycodone Hives and Anaphylaxis    Had hives and shortness of breath Hives Other reaction(s): Hives Had hives and shortness of breath   Shellfish Allergy Anaphylaxis and Hives   Amoxicillin Other (See Comments)    Burns when urinating Burns when urinating   Lisinopril Cough   Morphine And Related     SOB, rash & hives    HOME MEDICATIONS: Current Outpatient Medications  Medication Sig Dispense Refill   albuterol (VENTOLIN HFA) 108 (90 Base) MCG/ACT inhaler Inhale 2 puffs into the lungs every 6 (six) hours as needed for wheezing or shortness of breath. 8 g 1   amLODipine (NORVASC) 10 MG tablet Take 1 tablet (10 mg total) by mouth daily. 90 tablet 3   amphetamine-dextroamphetamine (ADDERALL XR) 10 MG 24 hr capsule Take 1 capsule (10 mg total) by mouth daily. 30 capsule 0   carbamazepine (CARBATROL) 300 MG 12 hr capsule Take 1 capsule (300 mg total) by mouth 2 (two) times daily. 60 capsule 2   EPINEPHrine (EPIPEN 2-PAK) 0.3 mg/0.3 mL IJ SOAJ injection Inject 0.3 mLs (0.3 mg total) into the muscle once. 1 Device 5   escitalopram (LEXAPRO) 10 MG tablet Take 1 tablet (10 mg total) by mouth daily. 30 tablet 5   fluticasone furoate-vilanterol (BREO ELLIPTA) 100-25 MCG/INH AEPB Inhale 1 puff into the lungs daily. 60 each 3   gabapentin  (NEURONTIN) 300 MG capsule Take 300 mg by mouth 3 (three) times daily.     latanoprost (XALATAN) 0.005 % ophthalmic solution Place 1 drop into both eyes at bedtime.     losartan-hydrochlorothiazide (HYZAAR) 100-25 MG tablet Take 1 tablet by mouth daily. 90 tablet 3   metoprolol succinate (TOPROL-XL) 50 MG 24 hr tablet Take 1 tablet (50 mg total) by mouth daily. Take with or immediately following a meal. 90 tablet 3   montelukast (SINGULAIR) 10 MG tablet Take 1 tablet (10 mg total) by mouth at bedtime. 30 tablet 3   omeprazole (PRILOSEC OTC) 20 MG tablet Take 1 tablet (20 mg total) by mouth daily. 42 tablet 2   phenazopyridine (PYRIDIUM) 200 MG tablet Take 1 tablet (200 mg total) by mouth 3 (three) times daily as needed for pain. 10 tablet 0   traMADol (ULTRAM) 50 MG tablet Take 1-2 tablets (50-100 mg total) by mouth every 6 (six) hours as needed for moderate pain. 15 tablet 2   tretinoin (  RETIN-A) 0.05 % cream Apply 1 application topically at bedtime.     zolpidem (AMBIEN) 10 MG tablet Take 1 tablet (10 mg total) by mouth at bedtime as needed for sleep. (Patient not taking: Reported on 12/04/2020) 90 tablet 1   No current facility-administered medications for this visit.    PAST MEDICAL HISTORY: Past Medical History:  Diagnosis Date   Chronic kidney disease    left ureteral stone   COVID 08/07/2020   Hypertension    Ovarian cyst    Seasonal allergies    Trigeminal neuralgia     PAST SURGICAL HISTORY: Past Surgical History:  Procedure Laterality Date   CESAREAN SECTION     CYSTOSCOPY WITH RETROGRADE PYELOGRAM, URETEROSCOPY AND STENT PLACEMENT Left 10/15/2020   Procedure: CYSTOSCOPY WITH RETROGRADE PYELOGRAM, URETEROSCOPY AND STENT PLACEMENT laser lithotrispsy and basket extraction;  Surgeon: Crist Fat, MD;  Location: [facility];  Service: Urology;  Laterality: Left;    FAMILY HISTORY: Family History  Problem Relation Age of Onset   Alcohol abuse Other    Arthritis Other     Breast cancer Other    Colon cancer Other    Diabetes Other    Hyperlipidemia Other    Hypertension Other    Prostate cancer Other    Depression Other    Stroke Other    Sudden death Other    Coronary artery disease Other     SOCIAL HISTORY: Social History   Socioeconomic History   Marital status: Married    Spouse name: [name]   Number of children: 3   Years of education: Not on file   Highest education level: Associate degree: academic program  Occupational History   Not on file  Tobacco Use   Smoking status: Never   Smokeless tobacco: Never  Substance and Sexual Activity   Alcohol use: No    Alcohol/week: 0.0 standard drinks   Drug use: No   Sexual activity: Not on file  Other Topics Concern   Not on file  Social History Narrative   Lives with family   Social Determinants of Health   Financial Resource Strain: Not on file  Food Insecurity: Not on file  Transportation Needs: Not on file  Physical Activity: Not on file  Stress: Not on file  Social Connections: Not on file  Intimate Partner Violence: Not on file      Levert Feinstein, M.D. Ph.D.  [facility] [address], [address] [place], [place] [address] Ph: [phone] Fax: [phone]  CC:  Cynthia Andrew, NP [address] [place],  [place] [address]  Cynthia Salisbury, MD

## 2021-02-15 NOTE — Telephone Encounter (Signed)
MRI brain wo contrast: sent to GI for scheduling (they obtain Drucie Opitz)

## 2021-02-16 ENCOUNTER — Ambulatory Visit: Payer: 59 | Admitting: Rehabilitative and Restorative Service Providers"

## 2021-02-16 ENCOUNTER — Ambulatory Visit: Payer: 59 | Admitting: Speech Pathology

## 2021-02-19 ENCOUNTER — Encounter: Payer: Self-pay | Admitting: Neurology

## 2021-02-20 ENCOUNTER — Telehealth: Payer: Self-pay | Admitting: Neurology

## 2021-02-20 LAB — MULTIPLE MYELOMA PANEL, SERUM
Albumin SerPl Elph-Mcnc: 3.5 g/dL (ref 2.9–4.4)
Albumin/Glob SerPl: 1.1 (ref 0.7–1.7)
Alpha 1: 0.2 g/dL (ref 0.0–0.4)
Alpha2 Glob SerPl Elph-Mcnc: 0.8 g/dL (ref 0.4–1.0)
B-Globulin SerPl Elph-Mcnc: 1.3 g/dL (ref 0.7–1.3)
Gamma Glob SerPl Elph-Mcnc: 1.3 g/dL (ref 0.4–1.8)
Globulin, Total: 3.5 g/dL (ref 2.2–3.9)
IgA/Immunoglobulin A, Serum: 311 mg/dL (ref 87–352)
IgG (Immunoglobin G), Serum: 1367 mg/dL (ref 586–1602)
IgM (Immunoglobulin M), Srm: 85 mg/dL (ref 26–217)
Total Protein: 7 g/dL (ref 6.0–8.5)

## 2021-02-20 LAB — THYROID PANEL WITH TSH
Free Thyroxine Index: 1.8 (ref 1.2–4.9)
T3 Uptake Ratio: 29 % (ref 24–39)
T4, Total: 6.1 ug/dL (ref 4.5–12.0)
TSH: 0.829 u[IU]/mL (ref 0.450–4.500)

## 2021-02-20 LAB — RPR: RPR Ser Ql: NONREACTIVE

## 2021-02-20 LAB — CARBAMAZEPINE LEVEL, TOTAL: Carbamazepine (Tegretol), S: 3.1 ug/mL — ABNORMAL LOW (ref 4.0–12.0)

## 2021-02-20 LAB — VITAMIN B12: Vitamin B-12: 732 pg/mL (ref 232–1245)

## 2021-02-20 LAB — HIV ANTIBODY (ROUTINE TESTING W REFLEX): HIV Screen 4th Generation wRfx: NONREACTIVE

## 2021-02-20 LAB — VITAMIN D 25 HYDROXY (VIT D DEFICIENCY, FRACTURES): Vit D, 25-Hydroxy: 27.3 ng/mL — ABNORMAL LOW (ref 30.0–100.0)

## 2021-02-20 NOTE — Telephone Encounter (Addendum)
Attempted to reach patient several times. Left messages for a return call.

## 2021-02-20 NOTE — Telephone Encounter (Signed)
Please call patient, laboratory evaluation showed no significant abnormalities, she is a Tegretol 12 hours 300 mg twice a day for trigeminal neuralgia, level was 3.1, much lower than previous level 12-month ago of 10.1  If she has no recurrent facial pain it is okay to keep current medications, rest of the laboratory evaluation showed no significant abnormality, continuing encourage her low-dose over-the-counter D3 supplement 1000 units daily

## 2021-02-21 ENCOUNTER — Encounter: Payer: Self-pay | Admitting: *Deleted

## 2021-02-21 NOTE — Telephone Encounter (Addendum)
Her phone is only ringing once before going to voicemail. I left another message asking for a return call.  I sent a mychart message concerning these results. I will also mail a letter to her home address.  If she calls back, I will happy to discuss them with her over the phone.

## 2021-02-26 ENCOUNTER — Other Ambulatory Visit: Payer: Self-pay

## 2021-02-26 ENCOUNTER — Encounter (HOSPITAL_BASED_OUTPATIENT_CLINIC_OR_DEPARTMENT_OTHER): Payer: Self-pay | Admitting: Emergency Medicine

## 2021-02-26 ENCOUNTER — Emergency Department (HOSPITAL_BASED_OUTPATIENT_CLINIC_OR_DEPARTMENT_OTHER): Payer: 59

## 2021-02-26 ENCOUNTER — Emergency Department (HOSPITAL_BASED_OUTPATIENT_CLINIC_OR_DEPARTMENT_OTHER)
Admission: EM | Admit: 2021-02-26 | Discharge: 2021-02-26 | Disposition: A | Discharge status: Home / Self Care | Payer: 59 | Attending: Emergency Medicine | Admitting: Emergency Medicine

## 2021-02-26 DIAGNOSIS — Z79899 Other long term (current) drug therapy: Secondary | ICD-10-CM | POA: Insufficient documentation

## 2021-02-26 DIAGNOSIS — I129 Hypertensive chronic kidney disease with stage 1 through stage 4 chronic kidney disease, or unspecified chronic kidney disease: Secondary | ICD-10-CM | POA: Diagnosis not present

## 2021-02-26 DIAGNOSIS — J9801 Acute bronchospasm: Secondary | ICD-10-CM

## 2021-02-26 DIAGNOSIS — R0602 Shortness of breath: Secondary | ICD-10-CM | POA: Diagnosis present

## 2021-02-26 DIAGNOSIS — Z8616 Personal history of COVID-19: Secondary | ICD-10-CM | POA: Insufficient documentation

## 2021-02-26 DIAGNOSIS — N182 Chronic kidney disease, stage 2 (mild): Secondary | ICD-10-CM | POA: Diagnosis not present

## 2021-02-26 LAB — CBC WITH DIFFERENTIAL/PLATELET
Abs Immature Granulocytes: 0.04 10*3/uL (ref 0.00–0.07)
Basophils Absolute: 0.1 10*3/uL (ref 0.0–0.1)
Basophils Relative: 1 %
Eosinophils Absolute: 0.1 10*3/uL (ref 0.0–0.5)
Eosinophils Relative: 1 %
HCT: 32.4 % — ABNORMAL LOW (ref 36.0–46.0)
Hemoglobin: 11.2 g/dL — ABNORMAL LOW (ref 12.0–15.0)
Immature Granulocytes: 0 %
Lymphocytes Relative: 23 %
Lymphs Abs: 2.3 10*3/uL (ref 0.7–4.0)
MCH: 32.3 pg (ref 26.0–34.0)
MCHC: 34.6 g/dL (ref 30.0–36.0)
MCV: 93.4 fL (ref 80.0–100.0)
Monocytes Absolute: 1.1 10*3/uL — ABNORMAL HIGH (ref 0.1–1.0)
Monocytes Relative: 11 %
Neutro Abs: 6.4 10*3/uL (ref 1.7–7.7)
Neutrophils Relative %: 64 %
Platelets: 322 10*3/uL (ref 150–400)
RBC: 3.47 MIL/uL — ABNORMAL LOW (ref 3.87–5.11)
RDW: 13.2 % (ref 11.5–15.5)
WBC: 10 10*3/uL (ref 4.0–10.5)
nRBC: 0 % (ref 0.0–0.2)

## 2021-02-26 LAB — BASIC METABOLIC PANEL
Anion gap: 9 (ref 5–15)
BUN: 11 mg/dL (ref 6–20)
CO2: 28 mmol/L (ref 22–32)
Calcium: 9 mg/dL (ref 8.9–10.3)
Chloride: 96 mmol/L — ABNORMAL LOW (ref 98–111)
Creatinine, Ser: 1.02 mg/dL — ABNORMAL HIGH (ref 0.44–1.00)
GFR, Estimated: >60 mL/min (ref >60)
Glucose, Bld: 114 mg/dL — ABNORMAL HIGH (ref 70–99)
Potassium: 2.9 mmol/L — ABNORMAL LOW (ref 3.5–5.1)
Sodium: 133 mmol/L — ABNORMAL LOW (ref 135–145)

## 2021-02-26 LAB — MAGNESIUM: Magnesium: 1.8 mg/dL (ref 1.7–2.4)

## 2021-02-26 LAB — HCG, SERUM, QUALITATIVE: Preg, Serum: NEGATIVE

## 2021-02-26 MED ORDER — ALBUTEROL (5 MG/ML) CONTINUOUS INHALATION SOLN
10.0000 mg/h | INHALATION_SOLUTION | RESPIRATORY_TRACT | Status: DC
  Administered 2021-02-26: 10 mg/h via RESPIRATORY_TRACT
  Filled 2021-02-26: qty 20

## 2021-02-26 MED ORDER — POTASSIUM CHLORIDE CRYS ER 20 MEQ PO TBCR
40.0000 meq | EXTENDED_RELEASE_TABLET | Freq: Once | ORAL | Status: AC
  Administered 2021-02-26: 40 meq via ORAL
  Filled 2021-02-26: qty 2

## 2021-02-26 MED ORDER — ONDANSETRON HCL 4 MG/2ML IJ SOLN
4.0000 mg | Freq: Once | INTRAMUSCULAR | Status: AC
  Administered 2021-02-26: 4 mg via INTRAVENOUS
  Filled 2021-02-26: qty 2

## 2021-02-26 MED ORDER — METHYLPREDNISOLONE SODIUM SUCC 125 MG IJ SOLR
125.0000 mg | Freq: Once | INTRAMUSCULAR | Status: AC
  Administered 2021-02-26: 125 mg via INTRAVENOUS
  Filled 2021-02-26: qty 2

## 2021-02-26 MED ORDER — PREDNISONE 50 MG PO TABS: 50.0000 mg | ORAL_TABLET | Freq: Every day | ORAL | 0 refills | Status: AC

## 2021-02-26 NOTE — ED Triage Notes (Signed)
Patient states has "long covid" and having shortness of breath.

## 2021-02-26 NOTE — ED Provider Notes (Signed)
[facility] EMERGENCY DEPARTMENT Provider Note   CSN: [id number] Arrival date & time: 02/26/21  1737     History Chief Complaint  Patient presents with   Shortness of Breath    Cynthia Roach is a 50 y.o. female who presents with concern for 3 hours of shortness of breath  and tightness in her chest.  Patient appears very anxious.  According to her husband they were in the car as a family when there was a "stressful conversation between the parents and children" and the patient began to become more anxious.  He states that her respirations have escalated since that time.  I personally read this patient's medical records.  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  Patient has been occupational therapy and speech therapy, seen multiple specialties including pulmonology.  Most recently saw pulmonology in May of this year; has persistent SOB, wheezing, cough since COVID-19 infection in 07/2020.  Now has airway hyperresponsiveness with bronchospasm.  She has been using her albuterol inhaler 2 puffs every hour since 3:00 today with minimal relief.  At this time, denies any chest pain, shortness of breath, swelling, tightness in her throat, or lightheadedness.  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   Shortness of breath 11/27/2020   Cough 11/27/2020   Anxiety 11/27/2020   History of COVID-19 11/27/2020   Stutter 11/27/2020   Tremor 11/27/2020   Insomnia 11/27/2020   Vertigo 11/27/2020   Physical deconditioning 11/27/2020   Vitamin D deficiency 11/27/2020    Tremors of nervous system 11/20/2020   Anxiety disorder 11/20/2020   Memory loss 11/20/2020   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 09/27/2020   Pneumonia due to COVID-19 virus 08/07/2020   Foot sprain, left, subsequent encounter 05/16/2020   Menopause 10/05/2019   Palpitations 10/05/2019   SOB (shortness of breath) 10/05/2019   Cellulitis of face 08/28/2018   CKD (chronic kidney disease), 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     Family History  Problem Relation Age of Onset   Alcohol abuse Other    Arthritis Other    Breast cancer Other    Colon cancer Other    Diabetes Other    Hyperlipidemia Other    Hypertension Other    Prostate cancer Other    Depression Other    Stroke Other    Sudden death Other    Coronary artery disease Other     Social History   Tobacco Use   Smoking  status: Never   Smokeless tobacco: Never  Substance Use Topics   Alcohol use: No    Alcohol/week: 0.0 standard drinks   Drug use: No    Home Medications Prior to Admission medications   Medication Sig Start Date End Date Taking? Authorizing Provider  predniSONE (DELTASONE) 50 MG tablet Take 1 tablet (50 mg total) by mouth daily with breakfast for 5 days. 02/26/21 03/03/21 Yes Delise Simenson, Lupe Carney R, PA-C  albuterol (VENTOLIN HFA) 108 (90 Base) MCG/ACT inhaler Inhale 2 puffs into the lungs every 6 (six) hours as  needed for wheezing or shortness of breath. 12/19/20   Nelwyn Salisbury, MD  amLODipine (NORVASC) 10 MG tablet Take 1 tablet (10 mg total) by mouth daily. 07/12/20   Nelwyn Salisbury, MD  amphetamine-dextroamphetamine (ADDERALL XR) 10 MG 24 hr capsule Take 1 capsule (10 mg total) by mouth daily. 11/20/20   Nelwyn Salisbury, MD  carbamazepine (CARBATROL) 300 MG 12 hr capsule Take 1 capsule (300 mg total) by mouth 2 (two) times daily. 09/12/20   Nelwyn Salisbury, MD  EPINEPHrine (EPIPEN 2-PAK) 0.3 mg/0.3 mL IJ SOAJ injection Inject 0.3 mLs (0.3 mg total) into the muscle once. 05/18/14   Nelwyn Salisbury, MD  escitalopram (LEXAPRO) 10 MG tablet Take 1 tablet (10 mg total) by mouth daily. 11/20/20   Nelwyn Salisbury, MD  fluticasone furoate-vilanterol (BREO ELLIPTA) 100-25 MCG/INH AEPB Inhale 1 puff into the lungs daily. 01/18/21   Virl Diamond A, MD  gabapentin (NEURONTIN) 300 MG capsule Take 300 mg by mouth 3 (three) times daily. 09/11/20   [provider]  latanoprost (XALATAN) 0.005 % ophthalmic solution Place 1 drop into both eyes at bedtime. 09/28/19   [provider]  losartan-hydrochlorothiazide (HYZAAR) 100-25 MG tablet Take 1 tablet by mouth daily. 07/12/20   Nelwyn Salisbury, MD  metoprolol succinate (TOPROL-XL) 50 MG 24 hr tablet Take 1 tablet (50 mg total) by mouth daily. Take with or immediately following a meal. 11/20/20   Nelwyn Salisbury, MD  montelukast (SINGULAIR) 10 MG tablet Take 1 tablet (10 mg total) by mouth at bedtime. 11/24/20   Ivonne Andrew, NP  omeprazole (PRILOSEC OTC) 20 MG tablet Take 1 tablet (20 mg total) by mouth daily. 11/30/20 11/30/21  Ivonne Andrew, NP  phenazopyridine (PYRIDIUM) 200 MG tablet Take 1 tablet (200 mg total) by mouth 3 (three) times daily as needed for pain. 10/15/20   Crist Fat, MD  traMADol (ULTRAM) 50 MG tablet Take 1-2 tablets (50-100 mg total) by mouth every 6 (six) hours as needed for moderate pain. 10/15/20   Crist Fat, MD   tretinoin (RETIN-A) 0.05 % cream Apply 1 application topically at bedtime. 01/25/20   [provider]  zolpidem (AMBIEN) 10 MG tablet Take 1 tablet (10 mg total) by mouth at bedtime as needed for sleep. Patient not taking: Reported on 12/04/2020 11/20/20 12/20/20  Nelwyn Salisbury, MD    Allergies    Oxycodone, Shellfish allergy, Amoxicillin, Lisinopril, and Morphine and related  Review of Systems   Review of Systems  Constitutional: Negative.   HENT: Negative.    Eyes: Negative.   Respiratory:  Positive for chest tightness, shortness of breath and wheezing. Negative for cough, choking and stridor.   Cardiovascular: Negative.   Gastrointestinal: Negative.   Genitourinary: Negative.   Musculoskeletal: Negative.   Skin: Negative.   Allergic/Immunologic: Negative.   Neurological: Negative.   Psychiatric/Behavioral:  The patient is nervous/anxious.  Physical Exam Updated Vital Signs BP 105/62   Pulse (!) 109   Temp 98.9 F (37.2 C) (Oral)   Resp 16   Ht 5\' 4"  (1.626 m)   SpO2 95%   BMI 28.49 kg/m   Physical Exam Vitals and nursing note reviewed.  Constitutional:      General: She is in acute distress.     Appearance: She is normal weight. She is ill-appearing. She is not toxic-appearing.     Interventions: She is not intubated.    Comments: Increased work of breathing  HENT:     Head: Normocephalic and atraumatic.     Nose: Nose normal.     Mouth/Throat:     Mouth: Mucous membranes are moist. No angioedema.     Pharynx: Oropharynx is clear. Uvula midline. No oropharyngeal exudate or posterior oropharyngeal erythema.     Comments: No tracheal or uvular deviation Eyes:     General: Lids are normal. Vision grossly intact.        Right eye: No discharge.        Left eye: No discharge.     Extraocular Movements: Extraocular movements intact.     Conjunctiva/sclera: Conjunctivae normal.     Pupils: Pupils are equal, round, and reactive to light.  Neck:      Trachea: Trachea and phonation normal.  Cardiovascular:     Rate and Rhythm: Normal rate and regular rhythm.     Pulses: Normal pulses.     Heart sounds: Normal heart sounds. No murmur heard. Pulmonary:     Effort: Tachypnea, accessory muscle usage, prolonged expiration and respiratory distress present. No bradypnea or retractions. She is not intubated.     Breath sounds: Decreased air movement and transmitted upper airway sounds present. Examination of the right-middle field reveals wheezing. Examination of the left-middle field reveals wheezing. Examination of the right-lower field reveals decreased breath sounds and wheezing. Examination of the left-lower field reveals decreased breath sounds and wheezing. Decreased breath sounds and wheezing present. No rales.     Comments: Unable to speak in full sentences due to SOB Chest:     Chest wall: No mass, lacerations, deformity, swelling, tenderness, crepitus or edema.  Abdominal:     General: Bowel sounds are normal. There is no distension.     Palpations: Abdomen is soft.     Tenderness: There is no abdominal tenderness. There is no right CVA tenderness, left CVA tenderness, guarding or rebound.  Musculoskeletal:        General: No deformity.     Cervical back: Full passive range of motion without pain, normal range of motion and neck supple. No edema, rigidity or crepitus. No pain with movement, spinous process tenderness or muscular tenderness.     Right lower leg: No tenderness. No edema.     Left lower leg: No tenderness. No edema.  Lymphadenopathy:     Cervical: No cervical adenopathy.  Skin:    General: Skin is warm and dry.     Capillary Refill: Capillary refill takes less than 2 seconds.     Findings: No rash.  Neurological:     General: No focal deficit present.     Mental Status: She is alert and oriented to person, place, and time. Mental status is at baseline.  Psychiatric:        Mood and Affect: Mood is anxious. Affect is  tearful.    ED Results / Procedures / Treatments   Labs (all labs ordered are listed, but  only abnormal results are displayed) Labs Reviewed  BASIC METABOLIC PANEL - Abnormal; Notable for the following components:      Result Value   Sodium 133 (*)    Potassium 2.9 (*)    Chloride 96 (*)    Glucose, Bld 114 (*)    Creatinine, Ser 1.02 (*)    All other components within normal limits  CBC WITH DIFFERENTIAL/PLATELET - Abnormal; Notable for the following components:   RBC 3.47 (*)    Hemoglobin 11.2 (*)    HCT 32.4 (*)    Monocytes Absolute 1.1 (*)    All other components within normal limits  HCG, SERUM, QUALITATIVE  MAGNESIUM    EKG EKG Interpretation  Date/Time:  Monday February 26 2021 18:14:40 EDT Ventricular Rate:  62 PR Interval:  197 QRS Duration: 98 QT Interval:  455 QTC Calculation: 463 R Axis:   71 Text Interpretation: Sinus rhythm Baseline wander in lead(s) II Confirmed by Virgina Norfolkuratolo, Adam ([id number]) on 02/26/2021 6:30:24 PM  Radiology DG Chest Port 1 View  Result Date: 02/26/2021 CLINICAL DATA:  Wheezing and shortness of breath. EXAM: PORTABLE CHEST 1 VIEW COMPARISON:  10/15/2020 FINDINGS: The cardiomediastinal contours are normal. Pulmonary vasculature is normal. No consolidation, pleural effusion, or pneumothorax. No acute osseous abnormalities are seen. IMPRESSION: No acute chest findings. Electronically Signed   By: Narda RutherfordMelanie  Sanford M.D.   On: 02/26/2021 18:05    Procedures Procedures   Medications Ordered in ED Medications  albuterol (PROVENTIL,VENTOLIN) solution continuous neb (10 mg/hr Nebulization New Bag/Given 02/26/21 1801)  methylPREDNISolone sodium succinate (SOLU-MEDROL) 125 mg/2 mL injection 125 mg (125 mg Intravenous Given 02/26/21 1804)  ondansetron (ZOFRAN) injection 4 mg (4 mg Intravenous Given 02/26/21 1834)  potassium chloride SA (KLOR-CON) CR tablet 40 mEq (40 mEq Oral Given 02/26/21 1942)    ED Course  I have reviewed the triage vital signs and the  nursing notes.  Pertinent labs & imaging results that were available during my care of the patient were reviewed by me and considered in my medical decision making (see chart for details).  Clinical Course as of 02/26/21 2100  Mon Feb 26, 2021  [id number] Patient reevaluated after a few minutes of CAT, feeling much better, significant improvement in work of breathing.  Improvement in wheezing though continues to have some expiratory wheezes. Requesting antiemetic. [RS]  1923 Patient reevaluated, with continued improvement. Continues to have diminished breath sounds in bases bilaterally, wheezing improving.  [RS]  2054 Patient reevaluated continues to feel much improved, wheezing is completely resolved though there is still diminished air movement in the lung bases bilaterally. [RS]    Clinical Course User Index [RS] Kathyrn Warmuth, Eugene Gaviaebekah R, PA-C   MDM Rules/Calculators/A&P                         50 year old female who presents with concern for shortness of breath, chest tightness, and wheezing that started on 3 PM with associated anxiety.  Differential diagnosis includes but is limited to reactive airway disease, asthma exacerbation, pleural effusion, PE, pneumothorax, pneumonia, ACS, dissection.  Tachypneic on intake, vital signs otherwise normal.  Cardiac exam is normal, pulmonary exam revealed decreased air movement throughout the lung fields bilaterally with inspiratory and expiratory wheezing, no stridor though there are some transmitted upper airway noises.  Patient is anxious, tearful.  Neurovascularly intact in all 4 extremities.  We will proceed with continuous albuterol nebulizer given diminished air movement, IV Solu-Medrol, and basic laboratory studies.  CBC  unremarkable, anemia at patient's baseline of 11.2.  BMP with hyponatremia 133, hypokalemia of 2.9.  Oral repletion administered.    Magnesium is normal.  Patient is not pregnant.  Chest x-ray negative for acute cardiopulmonary  disease.  EKG reassuring, sinus rhythm without ischemic changes.  Patient reevaluated after administration of IV Solu-Medrol and continuous albuterol treatment with significant improvement in her symptoms.  Auscultation of the lungs revealed resolution of the wheezing in lung fields bilaterally though there is residual diminished air movement in the lung bases bilaterally.  No further work-up warranted in the ED at this time.  Bronchospasm appears to be improved following albuterol.  Will discharge with steroid burst and recommend close pulmonology follow-up.  Gissell voiced understanding of her medical evaluation and treatment plan.  Each of her questions was answered to her expressed satisfaction.  Return precautions are given.  Patient is well-appearing, stable, and appropriate for discharge at this time.  This chart was dictated using voice recognition software, Dragon. Despite the best efforts of this provider to proofread and correct errors, errors may still occur which can change documentation meaning.  Final Clinical Impression(s) / ED Diagnoses Final diagnoses:  Bronchospasm    Rx / DC Orders ED Discharge Orders          Ordered    predniSONE (DELTASONE) 50 MG tablet  Daily with breakfast        02/26/21 2055             August Gosser, Eugene Gavia, PA-C 02/26/21 2100    Virgina Norfolk, DO 02/26/21 2204

## 2021-02-26 NOTE — Discharge Instructions (Addendum)
You were seen in the ER today for your breathing difficulty.  You were diagnosed with bronchospasm, which is when your air passageways clamped down, similar to an asthma attack.  Your EKG, chest x-ray, and blood work were very reassuring.  Your symptoms improved significantly after administration of albuterol treatment.  You were also administered your first dose of steroids through your IV.  You have been prescribed 5 days of steroid to take at home to prevent recurrence of your reactive airway disease.  Please take them as prescribed for the entire course.  Please call your pulmonologist to inform them that you experienced this later today and schedule follow-up appointment.  Return to the ER if you develop any recurrence of your breathing difficulties, chest pain, palpitations, or any other new severe symptoms.

## 2021-02-28 ENCOUNTER — Ambulatory Visit: Payer: 59 | Admitting: Family Medicine

## 2021-03-02 ENCOUNTER — Ambulatory Visit: Payer: 59 | Admitting: Family Medicine

## 2021-03-02 ENCOUNTER — Other Ambulatory Visit: Payer: Self-pay

## 2021-03-02 ENCOUNTER — Encounter: Payer: Self-pay | Admitting: Family Medicine

## 2021-03-02 VITALS — BP 128/90 | HR 59 | Temp 98.2°F | Wt 165.4 lb

## 2021-03-02 DIAGNOSIS — R002 Palpitations: Secondary | ICD-10-CM

## 2021-03-02 DIAGNOSIS — G4700 Insomnia, unspecified: Secondary | ICD-10-CM

## 2021-03-02 DIAGNOSIS — Z8616 Personal history of COVID-19: Secondary | ICD-10-CM | POA: Diagnosis not present

## 2021-03-02 DIAGNOSIS — R0602 Shortness of breath: Secondary | ICD-10-CM

## 2021-03-02 DIAGNOSIS — U099 Post covid-19 condition, unspecified: Secondary | ICD-10-CM

## 2021-03-02 DIAGNOSIS — I10 Essential (primary) hypertension: Secondary | ICD-10-CM

## 2021-03-02 DIAGNOSIS — F418 Other specified anxiety disorders: Secondary | ICD-10-CM | POA: Diagnosis not present

## 2021-03-02 DIAGNOSIS — R2990 Unspecified symptoms and signs involving the nervous system: Secondary | ICD-10-CM | POA: Diagnosis not present

## 2021-03-02 MED ORDER — CARBAMAZEPINE ER 300 MG PO CP12: 300.0000 mg | ORAL_CAPSULE | Freq: Two times a day (BID) | ORAL | 11 refills | Status: DC

## 2021-03-02 MED ORDER — POTASSIUM CHLORIDE CRYS ER 20 MEQ PO TBCR: 20.0000 meq | EXTENDED_RELEASE_TABLET | Freq: Two times a day (BID) | ORAL | 11 refills | Status: DC

## 2021-03-02 MED ORDER — MONTELUKAST SODIUM 10 MG PO TABS: 10.0000 mg | ORAL_TABLET | Freq: Every day | ORAL | 3 refills | Status: DC

## 2021-03-02 MED ORDER — DULOXETINE HCL 60 MG PO CPEP: 60.0000 mg | ORAL_CAPSULE | Freq: Every day | ORAL | 5 refills | Status: DC

## 2021-03-02 MED ORDER — ZOLPIDEM TARTRATE 10 MG PO TABS
10.0000 mg | ORAL_TABLET | Freq: Every evening | ORAL | 1 refills | Status: DC | PRN
Start: 2021-03-02 — End: 2021-09-04

## 2021-03-02 MED ORDER — ALBUTEROL SULFATE HFA 108 (90 BASE) MCG/ACT IN AERS: 2.0000 | INHALATION_SPRAY | RESPIRATORY_TRACT | 11 refills | Status: DC | PRN

## 2021-03-02 MED ORDER — EPINEPHRINE 0.3 MG/0.3ML IJ SOAJ: 0.3000 mg | Freq: Once | INTRAMUSCULAR | 5 refills | Status: AC

## 2021-03-02 MED ORDER — GABAPENTIN 300 MG PO CAPS: 300.0000 mg | ORAL_CAPSULE | Freq: Three times a day (TID) | ORAL | 5 refills | Status: DC | PRN

## 2021-03-02 MED ORDER — TRAMADOL HCL 50 MG PO TABS: 100.0000 mg | ORAL_TABLET | Freq: Four times a day (QID) | ORAL | 2 refills | Status: DC | PRN

## 2021-03-02 NOTE — Progress Notes (Signed)
   Subjective:    Patient ID: Cynthia Roach, female    DOB: [birth year], 50 y.o.   MRN: [id number]  HPI Here with her husband to follow up on long haul Covid-19 symptoms. Since I saw her she has been working with PT and Speech Therapy. She saw Dr. Terrace Arabia in Neurology, but apparently she did not have much to offer Cynthia Roach, but she did order a brain MRI. She said she will refer her to someone with more experience with long haul Covid patients. She saw Dr. Wynona Neat in Pulmonology who has her on Breo and albuterol inhalers. She is scheduled for a chest CT and PFT's soon. She actually had an ER visit on 02-26-21 for an acute asthma exacerbation. She was treated with IV Solumedrol and nebulizers. She saw Cardiology for palpitations and she wore a Zio patch and has an ECHO scheduled. As far as her emotional state, she is still depressed and tired all the time. She tries to take walks with her husband, but they cannot go very far before she becomes exhausted. She took Adderall for a month or so, but she stopped it because it did not help her. She still takes Lexapro 10 mg daily. She sleeps fairly well as long as she takes Zolpidem.   Review of Systems  Constitutional:  Positive for fatigue.  Respiratory:  Positive for shortness of breath. Negative for cough.   Cardiovascular:  Positive for palpitations. Negative for chest pain and leg swelling.  Gastrointestinal: Negative.   Genitourinary: Negative.   Neurological:  Positive for dizziness, speech difficulty and weakness. Negative for tremors and seizures.  Psychiatric/Behavioral:  Positive for decreased concentration and dysphoric mood. Negative for agitation, behavioral problems, confusion, hallucinations, self-injury and suicidal ideas. The patient is nervous/anxious.       Objective:   Physical Exam Constitutional:      Appearance: Normal appearance.  Cardiovascular:     Rate and Rhythm: Normal rate and regular rhythm.     Pulses: Normal pulses.      Heart sounds: Normal heart sounds.  Pulmonary:     Effort: Pulmonary effort is normal.     Breath sounds: Normal breath sounds.  Neurological:     General: No focal deficit present.     Mental Status: She is alert and oriented to person, place, and time.  Psychiatric:        Behavior: Behavior normal.     Comments: Depressed affect, teary           Assessment & Plan:  She has a multitude of long haul Covid symptoms. She will follow up with Cardiology, Neurology, Pulmonology, PT and ST. For the depression , we will stop Lexapro and start her on Cymbalta 60 mg daily. Recheck with [place] in 3-4 weeks. We spent 35 minutes reviewing her records and discussing these issues.  Gershon Crane, MD

## 2021-03-07 ENCOUNTER — Encounter: Payer: Self-pay | Admitting: Counselor

## 2021-03-20 ENCOUNTER — Other Ambulatory Visit (HOSPITAL_COMMUNITY): Payer: 59

## 2021-03-23 ENCOUNTER — Ambulatory Visit: Payer: 59 | Admitting: Pulmonary Disease

## 2021-04-15 ENCOUNTER — Other Ambulatory Visit: Payer: Self-pay | Admitting: Nurse Practitioner

## 2021-04-20 ENCOUNTER — Other Ambulatory Visit: Payer: Self-pay

## 2021-04-20 ENCOUNTER — Encounter: Payer: Self-pay | Admitting: Family Medicine

## 2021-04-20 ENCOUNTER — Ambulatory Visit: Payer: 59 | Admitting: Family Medicine

## 2021-04-20 VITALS — BP 130/82 | HR 75 | Temp 98.9°F | Wt 173.0 lb

## 2021-04-20 DIAGNOSIS — R413 Other amnesia: Secondary | ICD-10-CM

## 2021-04-20 DIAGNOSIS — I10 Essential (primary) hypertension: Secondary | ICD-10-CM | POA: Diagnosis not present

## 2021-04-20 DIAGNOSIS — U099 Post covid-19 condition, unspecified: Secondary | ICD-10-CM

## 2021-04-20 DIAGNOSIS — R2990 Unspecified symptoms and signs involving the nervous system: Secondary | ICD-10-CM | POA: Diagnosis not present

## 2021-04-20 DIAGNOSIS — G4700 Insomnia, unspecified: Secondary | ICD-10-CM

## 2021-04-20 DIAGNOSIS — R159 Full incontinence of feces: Secondary | ICD-10-CM | POA: Insufficient documentation

## 2021-04-20 MED ORDER — DULOXETINE HCL 60 MG PO CPEP: 60.0000 mg | ORAL_CAPSULE | Freq: Two times a day (BID) | ORAL | 5 refills | Status: DC

## 2021-04-20 MED ORDER — GABAPENTIN 600 MG PO TABS: 600.0000 mg | ORAL_TABLET | Freq: Three times a day (TID) | ORAL | 5 refills | Status: DC

## 2021-04-20 NOTE — Progress Notes (Signed)
   Subjective:    Patient ID: Cynthia Roach, female    DOB: 10-20-1970, 50 y.o.   MRN: [id number]  HPI Here with her husband to follow up on long haul Covid-19 symptoms. Overall she thinks she is improving, but this is a very slow process. Also she has developed some new symptoms in the past few weeks. She describes occasionally losing control of her bowels, where he feels a contraction in the colon and stool is produced. The stool is normal in appearance and consistency. Also she has started to invert some of her letters when she writes. Her overall writing is still neat and clear, but she often turns the letters c, a, and b backwards. Otherwise Dr. Terrace Arabia has referred her to Clayborn Heron, a neuropsychologist, and she will see him on 05-17-21. She will her pulmonologist, Dr. Wynona Neat, on 0-13-99. At our last visit we started her on Cymbalta 60 mg once daily, and she says this has helped her depression a little, but she asks if this can be increased. She has stopped PT and ST because her insurance stopped covering it. As far as her myalgias, she is taking Gabapentin 300 mg TID, but she asks to increase this.    Review of Systems  Constitutional:  Positive for fatigue.  Respiratory: Negative.    Cardiovascular: Negative.   Gastrointestinal:  Negative for abdominal pain, anal bleeding, blood in stool, constipation, diarrhea, rectal pain and vomiting.  Genitourinary: Negative.   Musculoskeletal:  Positive for myalgias.  Neurological:  Positive for weakness.  Psychiatric/Behavioral:  Positive for decreased concentration, dysphoric mood and sleep disturbance. Negative for agitation, behavioral problems, confusion, hallucinations, self-injury and suicidal ideas. The patient is nervous/anxious.       Objective:   Physical Exam Constitutional:      Appearance: Normal appearance.  Cardiovascular:     Rate and Rhythm: Normal rate and regular rhythm.     Pulses: Normal pulses.     Heart sounds:  Normal heart sounds.  Pulmonary:     Effort: Pulmonary effort is normal.     Breath sounds: Normal breath sounds.  Neurological:     Mental Status: She is alert.          Assessment & Plan:  She is recovering from long haul Covid-19 symptoms, which seem to be primarily neurologic in nature. For the myalgias, we will increase Gabapentin to 600 mg TID. For the depression, we will increase the Cymbalta to 60 mg BID. For the stool incontinence, we will refer her to GI.  We spent 35 minutes reviewing records and discussing these issues.  Gershon Crane, MD

## 2021-05-08 ENCOUNTER — Ambulatory Visit: Payer: 59 | Admitting: Pulmonary Disease

## 2021-05-10 ENCOUNTER — Telehealth: Payer: Self-pay

## 2021-05-10 NOTE — Telephone Encounter (Signed)
Please advise 

## 2021-05-10 NOTE — Telephone Encounter (Signed)
[facility] called requesting authorization to switch  carbamazepine (CARBATROL) 300 MG 12 hr capsule to a different brand because the previous brand is no long in stock.

## 2021-05-11 ENCOUNTER — Other Ambulatory Visit: Payer: Self-pay

## 2021-05-11 ENCOUNTER — Telehealth: Payer: Self-pay

## 2021-05-11 NOTE — Telephone Encounter (Signed)
Pt pharmacy called state that her Carbamazepine is no longer in stock, request from Dr Fry too approve changing to [facility]co brand, Rx approved by Dr Fry 

## 2021-05-11 NOTE — Telephone Encounter (Signed)
Change this to Carbamazepine 12 hours tablets, to take BID, call in #[phone] with 5 rf

## 2021-05-11 NOTE — Telephone Encounter (Signed)
Pt pharmacy called state that her Carbamazepine is no longer in stock, request from Dr Clent Ridges too approve changing to Pr[employer]co brand, Rx approved by Dr Clent Ridges

## 2021-05-14 ENCOUNTER — Other Ambulatory Visit: Payer: Self-pay | Admitting: Family Medicine

## 2021-05-17 ENCOUNTER — Other Ambulatory Visit: Payer: Self-pay

## 2021-05-17 ENCOUNTER — Encounter: Payer: Self-pay | Admitting: Counselor

## 2021-05-17 ENCOUNTER — Ambulatory Visit: Payer: 59

## 2021-05-17 ENCOUNTER — Ambulatory Visit (INDEPENDENT_AMBULATORY_CARE_PROVIDER_SITE_OTHER): Payer: 59 | Admitting: Counselor

## 2021-05-17 DIAGNOSIS — R419 Unspecified symptoms and signs involving cognitive functions and awareness: Secondary | ICD-10-CM | POA: Diagnosis not present

## 2021-05-17 DIAGNOSIS — F09 Unspecified mental disorder due to known physiological condition: Secondary | ICD-10-CM

## 2021-05-17 DIAGNOSIS — U099 Post covid-19 condition, unspecified: Secondary | ICD-10-CM

## 2021-05-17 NOTE — Progress Notes (Signed)
   Psychometrist Note   Cognitive testing was administered to [employer] by Cynthia Roach, B.S. (Technician) under the supervision of Cynthia Roach, Psy.D., ABN. Cynthia Roach was able to tolerate all test procedures. Cynthia Roach met with the patient as needed to manage any emotional reactions to the testing procedures. Rest breaks were offered.    The battery of tests administered was selected by Cynthia Roach with consideration to the patient's current level of functioning, the nature of her symptoms, emotional and behavioral responses during the interview, level of literacy, observed level of motivation/effort, and the nature of the referral question. This battery was communicated to the psychometrist. Communication between Cynthia Roach and the psychometrist was ongoing throughout the evaluation and Cynthia Roach was immediately accessible at all times. Cynthia Roach provided supervision to the technician on the date of this service, to the extent necessary to assure the quality of all services provided.    Cynthia Roach will return in approximately one week for an interactive feedback session with Cynthia Roach, at which time test performance, clinical impressions, and treatment recommendations will be reviewed in detail. The patient understands she can contact our office should she require our assistance before this time.   A total of 110 minutes of billable time were spent with Cynthia Roach by the technician, including test administration and scoring time. Billing for these services is reflected in Cynthia Roach note.   This note reflects time spent with the psychometrician and does not include test scores, clinical history, or any interpretations made by Cynthia Roach. The full report will follow in a separate note.

## 2021-05-17 NOTE — Progress Notes (Signed)
NEUROPSYCHOLOGICAL EVALUATION [facility]  Patient Name: Cynthia Roach MRN: [id number] Date of Birth: July 10, 1971 Age: 50 y.o. Education: 14 years  Referral Circumstances and Background Information  Cynthia Roach is a 50 y.o., right-hand dominant, married woman with a history of long COVID with COVID-19 infection in December, 2021. She had hypoxemic resipiratory failure with bilateral infiltrates on CXR and required 2L of oxygen initially. She improved throughout her admission and was no longer O2 dependent at discharge. She was never intubated. She was admitted August 07, 2020 and was discharged 08/11/2020. She has seen Dr. Terrace Arabia at [facility] on referral from Dr. Clent Ridges who referred her for neuropsychological evaluation.   On interview, the patient reported that her memory and thinking problems did not become particularly noticeable until April, 2022. She feels like she had "a little brain fog" but it was not that bad. In mid April, however, she got COVID again and felt like she started having pronounced symptoms after that. She was able to recover at home. She reported that since then she has very significant cognitive problems including difficulties reading, writing, and she is complaining of not being able to remember things from the remote past such as her childhood and her first memory. This timeline is at odds with the timeline in the chart, which suggests that she started having significant cognitive difficulties after her hospital stay in December, 2021 as per Dr. Zannie Cove documentation. The patient's husband corroborated the timeline in the chart and thinks that her cognitive changes following her first COVID infection never got better. In terms of day-to-day symptoms, the patient is reporting a number of physical problems including vertigo, tremors, and her husband says that she looks like someone with Parkinson's disease. She reported that she falls frequently and her balance is off. On  specific review of cognitive symptoms they acknowledged forgetfulness, problems with attention and concentration, with processing speed, with word finding, with comprehension of language, and with writing. They are saying that she has problems writing as though she is "dyslexic" and brought out two letters that she ostensibly wrote where there are misspellings and she flipped letters around (specifically vowels from my review), which they said is not like her. They feel she is also having problems with decision making and problem solving.   It sounds like the patient has significant affective issues since her COVID-19. She acknowledged thinking about her illness a lot, she dreams about it, and she experiences those thoughts as intrusive. Her husband stated that she will start crying when she hears an ambulance go by. The patient reported that she tries to smile but "it's a lot" and she feels sad and angry. Her son and daughter do not let her around her grandchildren because they are worried that she will give her children COVID. My understanding is that she and her husband are not vaccinated and do not want to get vaccinated and that makes their children want to avoid them. With respect to sleep, the patient does not sleep well, she reported that she only sleeps about 2 hours a night and her mind is "all over the place." She feels exhausted much of the time. Her husband describes her as sad "all the time." Her weight has fluctuated to some extent and she reported that her appetite is less. Her husband "basically makes me eat." It sounds like she does not have much that she enjoys or looks forward to. She reported that she isolates herself because she does  not want to get sick again.   With respect to function, the patient has not returned to work. She was previously working as a traveling paramedic and does not think that she can do it anymore. She is in the disability application process. She spends most of her  time cleaning, taking care of her animals and her plants. She reported that she is now "OCD" and feels like things need to be very clean. She is no longer reading because she feels that it is too challenging. She doesn't read because "the comprehension of those words is too much." She is not driving and does not feel like she should, she reported that her primary doctor said she shouldn't. I do see that Dr. Clent Ridges had previously suggested she not drive proximal to her [id number]-[id number]. Her husband is concerned that she will get distracted. It sounds like she does not do much around the house, her husband does most things, although she will cook dinner when she can be supervised by her daughter. The patient reported that she is able to use a computer but she does not, because it involves reading, and her comprehension is "not good." She is able to accompany her husband out into the community, such as with shopping, although her husband reported that her "anxiety kicks in." She doesn't like to be in crowds and worries she will get infected. She says she has a hard time handling money, because counting is hard for her.   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 She is saying she was putting up curtains and fell off the stool in March/April and hit her head on a chair and then the floor. She thinks that she lost consciousness and was awoken by her son about 7 minutes later. She did not go to the hospital but reported that she felt dazed afterwards. She thinks that she felt normal after a minute or two, with the exception of some nausea. She said that is about the time her writing changed but otherwise, she appreciates no lingering difficulties.   MRI of the brain was ordered by Dr. Terrace Arabia, I do not see it was ever obtained.   Patient has had involvement with SLP and multiple other medical specialties.   CTH from 2016, which was normal, no recent neuroimaging.   MMSE with Dr. Terrace Arabia 19/30 (02/15/2021).  Current Outpatient Medications  Medication  Sig Dispense Refill   albuterol (VENTOLIN HFA) 108 (90 Base) MCG/ACT inhaler Inhale 2 puffs into the lungs every 4 (four) hours as needed for wheezing or shortness of breath. 18 g 11   amLODipine (NORVASC) 10 MG tablet Take 1 tablet (10 mg total) by mouth daily. 90 tablet 3   DULoxetine (CYMBALTA) 60 MG capsule Take 1 capsule (60 mg total) by mouth 2 (two) times daily. 60 capsule 5   fluticasone furoate-vilanterol (BREO ELLIPTA) 100-25 MCG/INH AEPB Inhale 1 puff into the lungs daily. 60 each 3   gabapentin (NEURONTIN) 600 MG tablet Take 1 tablet (600 mg total) by mouth 3 (three) times daily. 90 tablet 5   latanoprost (XALATAN) 0.005 % ophthalmic solution Place 1 drop into both eyes at bedtime.     losartan-hydrochlorothiazide (HYZAAR) 100-25  MG tablet Take 1 tablet by mouth daily. 90 tablet 3   metoprolol succinate (TOPROL-XL) 50 MG 24 hr tablet Take 1 tablet (50 mg total) by mouth daily. Take with or immediately following a meal. 90 tablet 3   montelukast (SINGULAIR) 10 MG tablet Take 1 tablet (10 mg total) by mouth at bedtime. 90 tablet 3   omeprazole (PRILOSEC OTC) 20 MG tablet Take 1 tablet (20 mg total) by mouth daily. 42 tablet 2   phenazopyridine (PYRIDIUM) 200 MG tablet Take 1 tablet (200 mg total) by mouth 3 (three) times daily as needed for pain. 10 tablet 0   potassium chloride SA (KLOR-CON) 20 MEQ tablet Take 1 tablet (20 mEq total) by mouth 2 (two) times daily. 60 tablet 11   traMADol (ULTRAM) 50 MG tablet Take 2 tablets (100 mg total) by mouth every 6 (six) hours as needed for moderate pain. 120 tablet 2   tretinoin (RETIN-A) 0.05 % cream Apply 1 application topically at bedtime.     zolpidem (AMBIEN) 10 MG tablet Take 1 tablet (10 mg total) by mouth at bedtime as needed for sleep. 90 tablet 1   No current facility-administered medications for this visit.   Family History  Problem Relation Age of Onset   Alcohol abuse Other    Arthritis Other    Breast cancer Other    Colon cancer Other    Diabetes Other    Hyperlipidemia Other    Hypertension Other    Prostate cancer Other    Depression Other    Stroke Other    Sudden death Other    Coronary artery disease Other    There is no  family history of dementia. There is no  family history of psychiatric illness.  Psychosocial History  Developmental, Educational and Employment History: The patient is from [facility]. I asked if she had any abuse or neglect and she said that she does not recall because she doesn't remember her childhood. She stated that she did "ok," but then she said that she was the class president, homecoming queen and she did very well. She was never held back and had no learning problems. She earned an associates degree in emergency  medicine at [employer], she reported that she did adequately. She actually tried to go back to school in January 1st and was planning to study nursing but dropped out because she was not able to comprehend the material. She was enrolled for about 3 months and then decided that she couldn't do it. 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 She had been doing that for 2 or 3  years. 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 She did stay at home to raise children for many years and home schooled her children.   Psychiatric History: The patient denied any history of depression or anxiety. She is involved in counseling and has been seeing a provider at [facility] counseling for 4 sessions but it sounds like she does not perceive it to be helpful. She has not seen a psychiatrist. She is on Cymbalta but reported that she doesn't find it helpful.   Substance Use History: The patient denied any alcohol use, nicotine use, or illicit substance use.   Relationship History and Living Cimcumstances: The patient and her husband have been married 20 years. They have three children, two of whom live with them. They have one grandchild.   Mental Status and Behavioral Observations  Sensorium/Arousal: The patient's level of arousal was awake and alert. Hearing and vision were adequate for testing purposes. Orientation: The patient was alert and oriented to person, place, time, and situation.  Appearance: Dressed in appropriate, casual clothing with reasonable grooming and hygiene Behavior: Patient presented as significantly fatigued, often looked at the ground Speech/language: Soft spoken, no word finding problems or paraphasic errors noted Gait/Posture: Not formally examined Movement: Patient appeared to have a tremor initially that then stopped after a few minutes into the interview  Social Comportment: Appropriate Mood: Patient reports that she tries to  smile but "it's a lot" Affect: Dysphoric with a restricted quality Thought process/content: Logical and goal oriented, content appropriate Safety: Patient denied any thoughts of harming herself or others on direct questioning. She does have thoughts of "fading away." She responded with a 3 to item 9 on the PHQ but once again indicated that she has no plan, intent, no suicidal ideation when I specifically queried that question.  Insight: Unclear  Mental status testing deferred.   Test Procedures  Wide Range Achievement Test - 4   Word Reading Wechsler Adult Intelligence Scale - IV  Digit Span  Arithmetic  Symbol Search  Coding Repeatable Battery for the Assessment of Neuropsychological Status (Form A) ACS Word Choice The Dot Counting Test Controlled Oral Word Association (F-A-S) Semantic Fluency (Animals) Trail Making Test A & B Modified [place] Card Sorting Test Patient Health Questionnaire - 9  GAD-7  Plan  Cynthia Roach was seen for a psychiatric diagnostic evaluation and neuropsychological testing. She is a pleasant, 50 year old, right-hand dominant married woman with a history of COVID-19 infection in December, 2021. She was never intubated or in the ICU but she did require 2L oxygen. She is complaining of debilitating cognitive difficulties since then and does not feel like she can work (nor has she returned to work). She is reporting symptoms including retrograde memory loss, difficulties with basic reading/comprehension, and developing dyslexia in addition to more garden variety memory, attention/concentration, and other cognitive problems. She brought in a writing sample showing her alleged dyslexia and reversal of letters. Full and complete note with impressions, recommendations, and interpretation of test data to follow.   Bettye Boeck Roseanne Reno, PsyD, ABN Clinical Neuropsychologist  Informed Consent  Risks and benefits of the evaluation were discussed with the patient  prior to all testing procedures. I conducted a clinical interview and neuropsychological testing (at least two tests) with Cynthia Roach and Cynthia Roach, B.S. (Technician) administered additional test procedures. The patient was able to tolerate the testing procedures and the patient (and/or family if applicable) is likely to benefit  from further follow up to receive the diagnosis and treatment recommendations, which will be rendered at the next encounter.

## 2021-05-18 ENCOUNTER — Ambulatory Visit
Admission: RE | Admit: 2021-05-18 | Discharge: 2021-05-18 | Disposition: A | Discharge status: Home / Self Care | Payer: 59 | Source: Ambulatory Visit | Attending: Neurology | Admitting: Neurology

## 2021-05-18 DIAGNOSIS — U099 Post covid-19 condition, unspecified: Secondary | ICD-10-CM

## 2021-05-18 DIAGNOSIS — G500 Trigeminal neuralgia: Secondary | ICD-10-CM | POA: Diagnosis not present

## 2021-05-18 NOTE — Progress Notes (Signed)
NEUROPSYCHOLOGICAL TEST SCORES [facility]  Patient Name: Cynthia Roach MRN: [id number] Date of Birth: [birth year] Age: 50 y.o. Education: 14 years  Measurement properties of test scores: IQ, Index, and Standard Scores (SS): Mean = 100; Standard Deviation = 15 Scaled Scores (Ss): Mean = 10; Standard Deviation = 3 Z scores (Z): Mean = 0; Standard Deviation = 1 T scores (T); Mean = 50; Standard Deviation = 10  TEST SCORES:    Note: This summary of test scores accompanies the interpretive report and should not be interpreted by unqualified individuals or in isolation without reference to the report. Test scores are relative to age, gender, and educational history as available and appropriate.   Performance Validity        ACS: Raw Descriptor      Word Choice: 39 Below Expectation      MSVT: Raw Descriptor      IR 80 Below Expectation      DR 70 Below Expectation      CNS 60 Below Expectation      PA 50 ---      FR 25 ---      The Dot Counting Test: Raw Descriptor      E-Score 11 Within Expectation      Embedded Measures: Raw Descriptor      RBANS Effort Index: 1 Within Expectation      WAIS-IV Reliable Digit Span 4 Below Expectation      WAIS-IV Reliable Digit Span Revised 7 Below Expectation      Expected Functioning        Wide Range Achievement Test (Word Reading): Standard/Scaled Score Percentile       Word Reading 79 8      Cognitive Testing        RBANS, Form : Standard/Scaled Score Percentile  Total Score 67 1  Immediate Memory 69 2      List Learning 5 5      Story Memory 4 2  Visuospatial/Constructional 78 7      Figure Copy   (17) 8 25      Judgment of Line Orientation   (10) --- 3-9  Language 91 27      Picture Naming --- 17-25      Semantic Fluency 7 16  Attention 72 3      Digit Span 9 37      Coding 2 <1  Delayed Memory 60 <1      List Recall   (0) --- <2      List Recognition   (17) --- <2      Story Recall   (6) 5 5       Figure Recall   (7) 4 2      Wechsler Adult Intelligence Scale - IV: Standard/Scaled Score Percentile  Working Memory Index 66 1      Digit Span 2 <1          Digit Span Forward 1 <1          Digit Span Backward 5 5          Digit Span Sequencing 6 9      Arithmetic 6 9  Processing Speed Index 62 1      Symbol Search 1 <1      Coding 5 5      Neuropsychological Assessment Battery (Language Module): T-score Percentile      Naming   (31) 54 66  Verbal Fluency: T-score Percentile      Controlled Oral Word Association (F-A-S) 55 69      Semantic Fluency (Animals) 69 97      Trail Making Test: T-Score Percentile      Part A 32 4      Part B 36 8      Modified [place] Card Sorting Test (MWCST): Standard/T-Score Percentile      Number of Categories Correct 54 66      Number of Perseverative Errors 61 86      Number of Total Errors 52 58      Percent Perseverative Errors 60 84  Executive Function Composite 112 79      Boston Diagnostic Aphasia Exam: Raw Score Scaled Score      Complex Ideational Material 9 5      Rating Scales         Raw Score Descriptor  Patient Health Questionnaire - 9 27 Severe  GAD-7 21 Severe   Dakai Braithwaite V. Roseanne Reno PsyD, ABN Clinical Neuropsychologist

## 2021-05-21 ENCOUNTER — Telehealth: Payer: Self-pay

## 2021-05-21 NOTE — Progress Notes (Signed)
[facility]  Patient Name: Cynthia Roach MRN: [id number] Date of Birth: July 01, 1971 Age: 50 y.o. 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 She has been reporting ongoing symptoms including cognitive problems, dizziness, tremors, vertigo, and her husband states that she "looks like someone with Parkinson's disease." With respect to cognitive symptoms, she is reporting very significant cognitive problems including symptoms that are rarely encountered in even very severe neurological problems (e.g., difficulties with reading and writing, acquired dyslexia, and not being able to recall things from the remote past). 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   Results of neuropsychological assessment are deemed invalid and uninterpretable on the basis of multiple standalone and embedded performance validity indicators falling well below the level of expectations. There are also unexpected, improbable patterns of performance. In the context of these findings, low scores should not be interpreted as evidence of invalid performance. Intact scores may be interpreted as reflective of at least adequate abilities, though actual abilities may be a bit better. She performed at a reasonable level on a measure of visuospatial construction, on some measures of digit repetition, on a measure of visual object confrontation naming, and on verbal fluency measures. Interestingly, she did well on a challenging measure of executive abilities involving attention, working memory, and problem solving. Low scores were obtained in other areas, of uncertain significance in the context of performance validity assessment. She is reporting extreme levels of psychiatric  symptoms, scoring at the most extreme level possible on self-report measures of depression and anxiety.   Results of this evaluation are thus consistent with performance validity problems. The etiology of her cognitive difficulties is unclear, although I would note that the extreme problems she reports seems difficult to attribute exclusively to Cynthia Roach given what has been reported about the condition in the literature. She does appear quite depressed and reports significant depression. The possibility that psychological factors are causing or contributing to her cognitive problems should be strongly considered.   Diagnostic Impressions: Neurocognitive disorder (reason for visit)   Recommendations to be discussed with patient  Your performance and presentation on assessment were consistent with performance validity problems. Just like a patient must lie still when undergoing an Xray for the picture to be interpretable, there are a set of assumptions that must be met for neuropsychological test findings to be of meaning. Your performance on validity measures suggests that we cannot be entirely sure that was the case. Validity problems can be caused by any number of things such as fatigue, difficulty maintaining attention, sensory impairments, idiosyncratic approaches to test taking, or misunderstanding test instructions.   In your case, I am not sure what is causing your cognitive problems. There is emerging evidence that people frequently report cognitive problems following COVID-19 including "brain fog" and difficulties with attention and executive control. There are some studies suggesting that there are measurable deficits in abilities like attention and executive function in individuals post-COVID. In some cases, this may be directly attributable to COVID-19 and it's effects on the body, such as in the setting of encephalitis, cerebrovascular consequences from blood clotting disorders, prolonged  hypoxia, or ICU acquired delirium. A high rate of individuals also report such symptoms following COVID-19 even when the disease itself  is "mild" and does not result in such consequences. It remains unclear to what extent these difficulties are directly attributable to COVID-19 and it's physiological effects. Most of the time, people report fairly mild problems that are irritating and interfering but are not debilitating.  Things like problems with reading comprehension, acquired dyslexia/flipping letters around in writing, and Parkinsonism have not have been reported int he COVID literature to the best of my knowledge. This does not mean we can 100% rule out that they are due to COVID-19, although it does open the door that there may be other factors at play.  You reported very high levels of symptoms associated with anxiety and depression. I would recommend that you treat your depression actively, such as with medication and psychotherapy. There are also many effective self-treatments, including behavioral activation.   There is a significant research base and evidence of effectiveness for something called "behavioral activation," which is a fancy way of saying that you should increase your activity level. In general, people do not feel as happy or do as well when they are not doing much. This can include things like getting out for walks, re-engaging in hobbies, spending time with family or friends, or learning a new hobby. It's not so important what you do as that you enjoy it and stick with it. Depression can start a vicious cycle where you are not doing a lot because you don't feel well, which leads to less things to be excited and happy about, and thus more depression and behavioral avoidance. It can be hard to change this pattern once it has started but most people find that they feel better when they start doing more even if they don't enjoy it at first.   Of course, you can return for assessment as  needed, such as in one year. I do not think you need to return for assessment unless you notice further changes in the future. I would also like to be clear that we cannot directly compare test findings from this appointment to future appointments because of performance validity problems during this appointment.   Test Findings  Test scores are summarized in additional documentation associated with this encounter. Test scores are relative to age, gender, and educational history as available and appropriate. There were concerns about performance validity given multiple measures below expectations on standalone and embedded measures. In the context of these findings, low scores should not be interpreted as indicative of acquired cognitive impairment. Adequate findings may be interpreted as reflective of at least adequate performance although actual abilities may be a bit better.   General Intellectual Functioning/Achievement:  Performance on single-word reading was unusually low, which would substantially temper expectations for the patient's cognitive test findings even if she did not have performance validity issues. While this would seem to be consistent with her report of problems reading, I would note that reading is typically used as a measure of premorbid status given its extreme resistance to acquired impairment.   Attention and Processing Efficiency: Performance was inconsistent on measures of attention and working memory, with adequate scores on one measure of digit repetition forward and then low scores of uncertain significance at an index level and on several other measures. There was also unexpected performance variability.   Indicators of processing speed generated low scores of dubious significant given performance validity concerns. There was also fairly significant performance inconsistency that is difficult to explain.   Language: Performance on language indicators was generally good,  with intact  visual object confrontation naming. She also scored at a good level on measures of verbal fluency involving generation of words. Her performance on category fluency for animals was in fact very strong, falling at a superior (almost very superior) level .   Visuospatial Function: Performance was low overall on measures in this domain. Figure copy was good, however, suggesting adequate constructional capacities. She scored at a low level on a putative "pure perceptual" measure involving judgment of angular line orientaitons.   Learning and Memory: Performance on measures of learning and memory generated mainly low scores of uncertain significance. She did demonstrate somewhat better retention of structured as compared to unstructured verbal information, which can be seen in the setting of attention/executive functioning attenuating memory performance.   Executive Functions: Performance was good on the [employer], with an Publishing copy Composite in the high average range. She also did well on generation of words in response to the letters F-A-S. Low scores were obtained on other measures including alternating sequencing of numbers and letters and reasoning with verbally presented information.   Rating Scale(s): Ms. Lindor scored at the highest possible level on self-report measures of symptoms associated with anxiety and depression.   Viviano Simas Nicole Kindred, PsyD, ABN Clinical Neuropsychologist  Coding and Compliance  Billing below reflects technician time, my direct face-to-face time with the patient, time spent in test administration, and time spent in professional activities including but not limited to: neuropsychological test interpretation, integration of neuropsychological test data with clinical history, report preparation, treatment planning, care coordination, and review of diagnostically pertinent medical history or studies.   Services associated with  this encounter: Clinical Interview [phone]) plus 165 minutes (96132/96133; Neuropsychological Evaluation by Professional)  110 minutes (96138/96139; Neuropsychological Testing by Technician)

## 2021-05-21 NOTE — Telephone Encounter (Signed)
Alexis from [employer] called to inform PCP that if patient needs any assistance with resources she can be contacted at  [phone]

## 2021-05-21 NOTE — Telephone Encounter (Signed)
FYI

## 2021-05-22 ENCOUNTER — Ambulatory Visit: Payer: 59 | Admitting: Neurology

## 2021-05-29 ENCOUNTER — Encounter: Payer: 59 | Admitting: Counselor

## 2021-08-12 ENCOUNTER — Emergency Department (HOSPITAL_BASED_OUTPATIENT_CLINIC_OR_DEPARTMENT_OTHER): Payer: 59

## 2021-08-12 ENCOUNTER — Encounter (HOSPITAL_BASED_OUTPATIENT_CLINIC_OR_DEPARTMENT_OTHER): Payer: Self-pay

## 2021-08-12 ENCOUNTER — Emergency Department (HOSPITAL_BASED_OUTPATIENT_CLINIC_OR_DEPARTMENT_OTHER)
Admission: EM | Admit: 2021-08-12 | Discharge: 2021-08-12 | Disposition: A | Discharge status: Home / Self Care | Payer: 59 | Attending: Emergency Medicine | Admitting: Emergency Medicine

## 2021-08-12 ENCOUNTER — Other Ambulatory Visit: Payer: Self-pay

## 2021-08-12 DIAGNOSIS — W230XXA Caught, crushed, jammed, or pinched between moving objects, initial encounter: Secondary | ICD-10-CM | POA: Diagnosis not present

## 2021-08-12 DIAGNOSIS — S8991XA Unspecified injury of right lower leg, initial encounter: Secondary | ICD-10-CM | POA: Diagnosis present

## 2021-08-12 DIAGNOSIS — M25462 Effusion, left knee: Secondary | ICD-10-CM | POA: Insufficient documentation

## 2021-08-12 DIAGNOSIS — Z79899 Other long term (current) drug therapy: Secondary | ICD-10-CM | POA: Diagnosis not present

## 2021-08-12 DIAGNOSIS — Z8616 Personal history of COVID-19: Secondary | ICD-10-CM | POA: Diagnosis not present

## 2021-08-12 DIAGNOSIS — M25561 Pain in right knee: Secondary | ICD-10-CM | POA: Diagnosis not present

## 2021-08-12 DIAGNOSIS — I129 Hypertensive chronic kidney disease with stage 1 through stage 4 chronic kidney disease, or unspecified chronic kidney disease: Secondary | ICD-10-CM | POA: Diagnosis not present

## 2021-08-12 DIAGNOSIS — N182 Chronic kidney disease, stage 2 (mild): Secondary | ICD-10-CM | POA: Diagnosis not present

## 2021-08-12 MED ORDER — MELOXICAM 7.5 MG PO TABS: 7.5000 mg | ORAL_TABLET | Freq: Every day | ORAL | 0 refills | Status: DC

## 2021-08-12 NOTE — ED Triage Notes (Signed)
Pt states a week ago she got her right leg caught in the couch and her knee dislocated. States stood up from table last night and knee gave out on her. States now pain with ambulation and extension, ambulatory with crutches to triage.

## 2021-08-12 NOTE — Discharge Instructions (Signed)
Please read and follow all provided instructions.  Your diagnoses today include:  1. Injury of right knee, initial encounter     Tests performed today include: An x-ray of the affected area - does NOT show any broken bones Vital signs. See below for your results today.   Medications prescribed:  Meloxicam - anti-inflammatory pain medication  You have been prescribed an anti-inflammatory medication or NSAID. Take with food. Do not take aspirin, ibuprofen, or naproxen if taking this medication. Take smallest effective dose for the shortest duration needed for your pain. Stop taking if you experience stomach pain or vomiting.   Take any prescribed medications only as directed.  Home care instructions:  Follow any educational materials contained in this packet Follow R.I.C.E. Protocol: R - rest your injury  I  - use ice on injury without applying directly to skin C - compress injury with bandage or splint E - elevate the injury as much as possible  Follow-up instructions: Call orthopedic referral tomorrow for an appointment.  Return instructions:  Please return if your toes or feet are numb or tingling, appear gray or blue, or you have severe pain (also elevate the leg and loosen splint or wrap if you were given one) Please return to the Emergency Department if you experience worsening symptoms.  Please return if you have any other emergent concerns.  Additional Information:  Your vital signs today were: BP 135/74 (BP Location: Left Arm)    Pulse 78    Temp 97.6 F (36.4 C) (Oral)    Resp 18    Ht 5\' 4"  (1.626 m)    Wt 78.5 kg    SpO2 100%    BMI 29.70 kg/m  If your blood pressure (BP) was elevated above 135/85 this visit, please have this repeated by your doctor within one month. --------------

## 2021-08-12 NOTE — ED Provider Notes (Signed)
[facility] EM[facility]ENC[facility] DEPARTMENT Provider Note   CSN: [id number] Arrival date & time: 08/12/21  1301     History Chief Complaint  Patient presents with   Knee Injury    Cynthia Roach is a 50 y.o. female.  Patient presents the emergency department for evaluation of knee pain.  Symptoms started about a week ago.  She states that she was getting up out of a couch and caught her knee.  She was able to walk and bear weight but had pain in the knee.  Yesterday she had a twisting injury and afterwards had worsening swelling, pain, and was unable to bear weight.  She has a "hot" sensation to her toes.  She is using crutches.  She was taking naproxen without much improvement. The onset of this condition was acute. The course is constant. Aggravating factors: Bearing weight, standing, movement. Alleviating factors: none.        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   Shortness of breath 11/27/2020   Cough 11/27/2020   History of COVID-19 11/27/2020   Stutter 11/27/2020   Tremor 11/27/2020   Insomnia 11/27/2020   Vertigo 11/27/2020   Physical deconditioning 11/27/2020   Vitamin D deficiency 11/27/2020   Tremors of nervous system 11/20/2020   Memory loss 11/20/2020   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 09/27/2020   Pneumonia due to COVID-19 virus 08/07/2020   Foot sprain, left, subsequent encounter 05/16/2020   Menopause 10/05/2019   Palpitations 10/05/2019   SOB (shortness of breath) 10/05/2019   Cellulitis of face 08/28/2018   CKD (chronic kidney disease), 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     Family History  Problem Relation Age of Onset   Alcohol abuse Other    Arthritis Other    Breast cancer Other    Colon cancer Other    Diabetes Other    Hyperlipidemia Other    Hypertension Other    Prostate cancer Other    Depression Other    Stroke Other    Sudden death Other    Coronary artery disease Other     Social History   Tobacco Use   Smoking status: Never   Smokeless tobacco: Never  Substance Use Topics   Alcohol use: No    Alcohol/week: 0.0 standard drinks   Drug use: No    Home Medications Prior to Admission medications   Medication Sig Start Date End Date Taking? Authorizing Provider  meloxicam (MOBIC) 7.5 MG tablet Take 1 tablet (7.5 mg total) by mouth  daily. 08/12/21  Yes Carlisle Cater, PA-C  albuterol (VENTOLIN HFA) 108 (90 Base) MCG/ACT inhaler Inhale 2 puffs into the lungs every 4 (four) hours as needed for wheezing or shortness of breath. 03/02/21   Laurey Morale, MD  amLODipine (NORVASC) 10 MG tablet Take 1 tablet (10 mg total) by mouth daily. 07/12/20   Laurey Morale, MD  DULoxetine (CYMBALTA) 60 MG capsule Take 1 capsule (60 mg total) by mouth 2 (two) times daily. 04/20/21   Laurey Morale, MD  fluticasone furoate-vilanterol (BREO ELLIPTA) 100-25 MCG/INH AEPB Inhale 1 puff into the lungs daily. 01/18/21   Laurin Coder, MD  gabapentin (NEURONTIN) 600 MG tablet Take 1 tablet (600 mg  total) by mouth 3 (three) times daily. 04/20/21   Laurey Morale, MD  latanoprost (XALATAN) 0.005 % ophthalmic solution Place 1 drop into both eyes at bedtime. 09/28/19   [provider]  losartan-hydrochlorothiazide (HYZAAR) 100-25 MG tablet Take 1 tablet by mouth daily. 07/12/20   Laurey Morale, MD  metoprolol succinate (TOPROL-XL) 50 MG 24 hr tablet Take 1 tablet (50 mg total) by mouth daily. Take with or immediately following a meal. 11/20/20   Laurey Morale, MD  montelukast (SINGULAIR) 10 MG tablet Take 1 tablet (10 mg total) by mouth at bedtime. 03/02/21   Laurey Morale, MD  omeprazole (PRILOSEC OTC) 20 MG tablet Take 1 tablet (20 mg total) by mouth daily. 11/30/20 11/30/21  Fenton Foy, NP  phenazopyridine (PYRIDIUM) 200 MG tablet Take 1 tablet (200 mg total) by mouth 3 (three) times daily as needed for pain. 10/15/20   Ardis Hughs, MD  potassium chloride SA (KLOR-CON) 20 MEQ tablet Take 1 tablet (20 mEq total) by mouth 2 (two) times daily. 03/02/21   Laurey Morale, MD  traMADol (ULTRAM) 50 MG tablet Take 2 tablets (100 mg total) by mouth every 6 (six) hours as needed for moderate pain. 03/02/21   Laurey Morale, MD  tretinoin (RETIN-A) 0.05 % cream Apply 1 application topically at bedtime. 01/25/20   [provider]  zolpidem (AMBIEN) 10 MG tablet Take 1 tablet (10 mg total) by mouth at bedtime as needed for sleep. 03/02/21 04/01/21  Laurey Morale, MD    Allergies    Oxycodone, Shellfish allergy, Amoxicillin, Lisinopril, and Morphine and related  Review of Systems   Review of Systems  Constitutional:  Negative for activity change.  Musculoskeletal:  Positive for arthralgias, gait problem and joint swelling. Negative for back pain and neck pain.  Skin:  Negative for wound.  Neurological:  Negative for weakness and numbness.   Physical Exam Updated Vital Signs BP 135/74 (BP Location: Left Arm)    Pulse 78    Temp 97.6 F (36.4 C) (Oral)    Resp 18    Ht 5\' 4"  (1.626 m)     Wt 78.5 kg    SpO2 100%    BMI 29.70 kg/m   Physical Exam Vitals and nursing note reviewed.  Constitutional:      Appearance: She is well-developed.  HENT:     Head: Normocephalic and atraumatic.  Eyes:     Pupils: Pupils are equal, round, and reactive to light.  Cardiovascular:     Pulses: Normal pulses. No decreased pulses.  Musculoskeletal:        General: Tenderness present.     Cervical back: Normal range of motion and neck supple.     Right knee: Effusion (Mild) present. Decreased range  of motion. Tenderness present.     Comments: Right lower extremity: Compartments of the calf are soft.  2+ DP pulse palpated.  Foot is warm.  Skin:    General: Skin is warm and dry.  Neurological:     Mental Status: She is alert.     Sensory: No sensory deficit.     Comments: Motor, sensation, and vascular distal to the injury is fully intact.   Psychiatric:        Mood and Affect: Mood normal.    ED Results / Procedures / Treatments   Labs (all labs ordered are listed, but only abnormal results are displayed) Labs Reviewed - No data to display  EKG None  Radiology DG Knee Complete 4 Views Right  Result Date: 08/12/2021 CLINICAL DATA:  Twisting injury 1 week ago, then again yesterday. Posterior knee pain. EXAM: RIGHT KNEE - COMPLETE 4+ VIEW COMPARISON:  None. FINDINGS: No fracture.  No bone lesion. Knee joint normally spaced and aligned. Minimal superior and inferior patellar osteophytes. There are calcified densities posteriorly, 1 a fabella, the others suspected to be intra-articular bodies. Small joint effusion suspected. IMPRESSION: 1. No fracture or bone lesion. 2. Small calcified densities posteriorly suggest intra-articular bodies. 3. Small joint effusion. Electronically Signed   By: Amie Portland M.D.   On: 08/12/2021 13:52    Procedures Procedures   Medications Ordered in ED Medications - No data to display  ED Course  I have reviewed the triage vital signs and the  nursing notes.  Pertinent labs & imaging results that were available during my care of the patient were reviewed by me and considered in my medical decision making (see chart for details).  Patient seen and examined.  X-ray reviewed.  No fractures.  Patient does have a small joint effusion.  She has crutches which she has been using.  Will give knee immobilizer, meloxicam, orthopedic referral.  Vital signs reviewed and are as follows: BP 135/74 (BP Location: Left Arm)    Pulse 78    Temp 97.6 F (36.4 C) (Oral)    Resp 18    Ht 5\' 4"  (1.626 m)    Wt 78.5 kg    SpO2 100%    BMI 29.70 kg/m   Patient was counseled on RICE protocol and told to rest injury, use ice for no longer than 15 minutes every hour, compress the area, and elevate above the level of their heart as much as possible to reduce swelling. Questions answered. Patient verbalized understanding.       MDM Rules/Calculators/A&P                          Patient with knee injury.  X-ray negative for fracture.  Compartments are soft.  Good distal pulses.  CMS intact.  Orthopedic referral information given.       Final Clinical Impression(s) / ED Diagnoses Final diagnoses:  Injury of right knee, initial encounter    Rx / DC Orders ED Discharge Orders          Ordered    meloxicam (MOBIC) 7.5 MG tablet  Daily        08/12/21 1410             08/14/21, PA-C 08/12/21 1414    08/14/21, MD 08/13/21 [phone]

## 2021-08-28 ENCOUNTER — Emergency Department (HOSPITAL_COMMUNITY): Payer: 59

## 2021-08-28 ENCOUNTER — Encounter (HOSPITAL_COMMUNITY): Payer: Self-pay | Admitting: Emergency Medicine

## 2021-08-28 ENCOUNTER — Emergency Department (HOSPITAL_COMMUNITY)
Admission: EM | Admit: 2021-08-28 | Discharge: 2021-08-28 | Disposition: A | Discharge status: Home / Self Care | Payer: 59 | Attending: Emergency Medicine | Admitting: Emergency Medicine

## 2021-08-28 DIAGNOSIS — Z8616 Personal history of COVID-19: Secondary | ICD-10-CM | POA: Insufficient documentation

## 2021-08-28 DIAGNOSIS — Z79899 Other long term (current) drug therapy: Secondary | ICD-10-CM | POA: Diagnosis not present

## 2021-08-28 DIAGNOSIS — R4781 Slurred speech: Secondary | ICD-10-CM | POA: Insufficient documentation

## 2021-08-28 DIAGNOSIS — R29818 Other symptoms and signs involving the nervous system: Secondary | ICD-10-CM | POA: Diagnosis not present

## 2021-08-28 DIAGNOSIS — R531 Weakness: Secondary | ICD-10-CM

## 2021-08-28 LAB — I-STAT BETA HCG BLOOD, ED (MC, WL, AP ONLY): I-stat hCG, quantitative: <5 m[IU]/mL (ref <5)

## 2021-08-28 LAB — CBC
HCT: 34.5 % — ABNORMAL LOW (ref 36.0–46.0)
Hemoglobin: 11.3 g/dL — ABNORMAL LOW (ref 12.0–15.0)
MCH: 31.4 pg (ref 26.0–34.0)
MCHC: 32.8 g/dL (ref 30.0–36.0)
MCV: 95.8 fL (ref 80.0–100.0)
Platelets: 333 10*3/uL (ref 150–400)
RBC: 3.6 MIL/uL — ABNORMAL LOW (ref 3.87–5.11)
RDW: 13.1 % (ref 11.5–15.5)
WBC: 6.7 10*3/uL (ref 4.0–10.5)
nRBC: 0 % (ref 0.0–0.2)

## 2021-08-28 LAB — COMPREHENSIVE METABOLIC PANEL
ALT: 13 U/L (ref 0–44)
AST: 20 U/L (ref 15–41)
Albumin: 3.5 g/dL (ref 3.5–5.0)
Alkaline Phosphatase: 86 U/L (ref 38–126)
Anion gap: 7 (ref 5–15)
BUN: 10 mg/dL (ref 6–20)
CO2: 23 mmol/L (ref 22–32)
Calcium: 8.7 mg/dL — ABNORMAL LOW (ref 8.9–10.3)
Chloride: 106 mmol/L (ref 98–111)
Creatinine, Ser: 1.23 mg/dL — ABNORMAL HIGH (ref 0.44–1.00)
GFR, Estimated: 54 mL/min — ABNORMAL LOW (ref >60)
Glucose, Bld: 118 mg/dL — ABNORMAL HIGH (ref 70–99)
Potassium: 3.8 mmol/L (ref 3.5–5.1)
Sodium: 136 mmol/L (ref 135–145)
Total Bilirubin: 0.7 mg/dL (ref 0.3–1.2)
Total Protein: 6.6 g/dL (ref 6.5–8.1)

## 2021-08-28 LAB — I-STAT CHEM 8, ED
BUN: 12 mg/dL (ref 6–20)
Calcium, Ion: 1.15 mmol/L (ref 1.15–1.40)
Chloride: 106 mmol/L (ref 98–111)
Creatinine, Ser: 1.1 mg/dL — ABNORMAL HIGH (ref 0.44–1.00)
Glucose, Bld: 113 mg/dL — ABNORMAL HIGH (ref 70–99)
HCT: 34 % — ABNORMAL LOW (ref 36.0–46.0)
Hemoglobin: 11.6 g/dL — ABNORMAL LOW (ref 12.0–15.0)
Potassium: 3.8 mmol/L (ref 3.5–5.1)
Sodium: 139 mmol/L (ref 135–145)
TCO2: 25 mmol/L (ref 22–32)

## 2021-08-28 LAB — DIFFERENTIAL
Abs Immature Granulocytes: 0.03 10*3/uL (ref 0.00–0.07)
Basophils Absolute: 0 10*3/uL (ref 0.0–0.1)
Basophils Relative: 1 %
Eosinophils Absolute: 0.1 10*3/uL (ref 0.0–0.5)
Eosinophils Relative: 2 %
Immature Granulocytes: 0 %
Lymphocytes Relative: 24 %
Lymphs Abs: 1.6 10*3/uL (ref 0.7–4.0)
Monocytes Absolute: 0.9 10*3/uL (ref 0.1–1.0)
Monocytes Relative: 13 %
Neutro Abs: 4.1 10*3/uL (ref 1.7–7.7)
Neutrophils Relative %: 60 %

## 2021-08-28 LAB — CBG MONITORING, ED: Glucose-Capillary: 121 mg/dL — ABNORMAL HIGH (ref 70–99)

## 2021-08-28 LAB — PROTIME-INR
INR: 0.8 (ref 0.8–1.2)
Prothrombin Time: 11.6 seconds (ref 11.4–15.2)

## 2021-08-28 LAB — APTT: aPTT: 30 seconds (ref 24–36)

## 2021-08-28 MED ORDER — LORAZEPAM 2 MG/ML IJ SOLN
1.0000 mg | Freq: Once | INTRAMUSCULAR | Status: AC
  Administered 2021-08-28: 1 mg via INTRAVENOUS
  Filled 2021-08-28: qty 1

## 2021-08-28 MED ORDER — SODIUM CHLORIDE 0.9% FLUSH: 3.0000 mL | Freq: Once | INTRAVENOUS | Status: DC

## 2021-08-28 MED ORDER — IOHEXOL 350 MG/ML SOLN
50.0000 mL | Freq: Once | INTRAVENOUS | Status: AC | PRN
  Administered 2021-08-28: 50 mL via INTRAVENOUS

## 2021-08-28 NOTE — Consult Note (Addendum)
Neurology consult   CC: code stroke.  History is obtained from: EMS, some from patient.   HPI:  [name] is a 51 yo female with a PMHx of HTN, CKD, trigeminal neuralgia, and seasonal allergies who presents to [facility] ED via EMS for a code stroke. Patient was seen in her normal state of health at 0330 hours. She got up this am and says she fell at 0815. Husband stated she had difficulty getting her words out, dysarthria, and right sided weakness.   After brief exam on the ED bridge and for airway clearance, patient was taken emergently to CT suite. CTH showed no acute infarct. CTA head and neck showed no LVO. Patient was taken back to ED without further imaging or intervention. LNK 0330 and is out of window for TNK.   In CT scanner, she continued with stuttering speech when trying to talk or answer questions. She had some right sided weakness and mild sensory deficits. Was able to name objects, but with delay.   Patient states she has a HA today but no n/v. No hx of MHAs. She had a HA for 3 days last week with one episode of n/v. Said she saw some zig zag lines at different times, but not necessarily before the HA.   In review of chart, patient had COVID in 2021 and has c/o symptoms associated with long COVID, like memory issues, anxiety and depression, brain fog (causing her to quit work), and joint pain Because of functional suspicion for presentation, plan for MRI brain only and no further workup unless abnormal.   LKW: 0330  hours TNK given?: No, outside the window.  IR Thrombectomy?: No, no LVO.  MRS:0  NIHSS:  1a Level of Consciousness:  1b LOC Questions: 1 1c LOC Commands: 0 2 Best Gaze: 0 3 Visual: 0 4 Facial Palsy: 0 5a Motor Arm - left: 0 5b Motor Arm - Right: 1 6a Motor Leg - Left: 0 6b Motor Leg - Right: 1 7 Limb Ataxia: 0 8 Sensory: 1 9 Best Language: 0 10 Dysarthria: 0 11 Extinction and Inattention: 0 TOTAL:  5  ROS: A robust ROS was unable to be performed due  to emergent nature of event.   Past Medical History:  Diagnosis Date   Chronic kidney disease    left ureteral stone   COVID 08/07/2020   Hypertension    Ovarian cyst    Seasonal allergies    Trigeminal neuralgia     Family History  Problem Relation Age of Onset   Alcohol abuse Other    Arthritis Other    Breast cancer Other    Colon cancer Other    Diabetes Other    Hyperlipidemia Other    Hypertension Other    Prostate cancer Other    Depression Other    Stroke Other    Sudden death Other    Coronary artery disease Other     Social History:  reports that she has never smoked. She has never used smokeless tobacco. She reports that she does not drink alcohol and does not use drugs.   Prior to Admission medications   Medication Sig Start Date End Date Taking? Authorizing Provider  albuterol (VENTOLIN HFA) 108 (90 Base) MCG/ACT inhaler Inhale 2 puffs into the lungs every 4 (four) hours as needed for wheezing or shortness of breath. 03/02/21   Nelwyn Salisbury, MD  amLODipine (NORVASC) 10 MG tablet Take 1 tablet (10 mg total) by mouth daily. 07/12/20  Nelwyn Salisbury, MD  DULoxetine (CYMBALTA) 60 MG capsule Take 1 capsule (60 mg total) by mouth 2 (two) times daily. 04/20/21   Nelwyn Salisbury, MD  fluticasone furoate-vilanterol (BREO ELLIPTA) 100-25 MCG/INH AEPB Inhale 1 puff into the lungs daily. 01/18/21   Tomma Lightning, MD  gabapentin (NEURONTIN) 600 MG tablet Take 1 tablet (600 mg total) by mouth 3 (three) times daily. 04/20/21   Nelwyn Salisbury, MD  latanoprost (XALATAN) 0.005 % ophthalmic solution Place 1 drop into both eyes at bedtime. 09/28/19   [provider]  losartan-hydrochlorothiazide (HYZAAR) 100-25 MG tablet Take 1 tablet by mouth daily. 07/12/20   Nelwyn Salisbury, MD  meloxicam (MOBIC) 7.5 MG tablet Take 1 tablet (7.5 mg total) by mouth daily. 08/12/21   Renne Crigler, PA-C  metoprolol succinate (TOPROL-XL) 50 MG 24 hr tablet Take 1 tablet (50 mg total) by  mouth daily. Take with or immediately following a meal. 11/20/20   Nelwyn Salisbury, MD  montelukast (SINGULAIR) 10 MG tablet Take 1 tablet (10 mg total) by mouth at bedtime. 03/02/21   Nelwyn Salisbury, MD  omeprazole (PRILOSEC OTC) 20 MG tablet Take 1 tablet (20 mg total) by mouth daily. 11/30/20 11/30/21  Ivonne Andrew, NP  phenazopyridine (PYRIDIUM) 200 MG tablet Take 1 tablet (200 mg total) by mouth 3 (three) times daily as needed for pain. 10/15/20   Crist Fat, MD  potassium chloride SA (KLOR-CON) 20 MEQ tablet Take 1 tablet (20 mEq total) by mouth 2 (two) times daily. 03/02/21   Nelwyn Salisbury, MD  traMADol (ULTRAM) 50 MG tablet Take 2 tablets (100 mg total) by mouth every 6 (six) hours as needed for moderate pain. 03/02/21   Nelwyn Salisbury, MD  tretinoin (RETIN-A) 0.05 % cream Apply 1 application topically at bedtime. 01/25/20   [provider]  zolpidem (AMBIEN) 10 MG tablet Take 1 tablet (10 mg total) by mouth at bedtime as needed for sleep. 03/02/21 04/01/21  Nelwyn Salisbury, MD    Exam: Current vital signs: BP (!) 167/92    Pulse (!) 107    Temp 98.7 F (37.1 C) (Oral)    Resp 18    SpO2 98%   Physical Exam  Constitutional: Appears well-developed and well-nourished.  Psych: Affect appropriate to situation. Eyes: No scleral injection. HENT: No OP obstruction. Head: Normocephalic.  Cardiovascular: Normal rate and regular rhythm.  Respiratory: Effort normal.  GI: Abdomen soft.  No distension. There is no tenderness.  Skin: WDI.  Neuro: Mental Status: Patient is awake, alert, oriented to person, place, month, year, and situation. Follows commands with some delay. Stutters with speech and naming.  Patient is able to give a clear and coherent history. No signs of neglect. Speech/Language:  Speech is stuttering but understood. Able to share what is going on in stroke test pictures. Names hat, glasses, pen, finger.  Repetition and fluency is intact when she does not stutter.    Cranial Nerves: II: Visual Fields are full. Pupils are equal, round, and reactive to light.  III,IV, VI: EOMI without ptosis or diploplia.  V: Facial sensation is symmetric to light touch in V1, V2, and V3 VII: Facial movement symmetrical.  VIII: hearing is intact to voice. X: Uvula elevates symmetrically. XI: Shoulder shrug is symmetric. XII: tongue is midline without atrophy or fasciculations.  Motor: RUE: grips  4/5     biceps  4/5     triceps  4/5 LUE: grips  5/5    biceps  5/5     triceps  5/5 RLE: knee  4/5     thigh   4/5     plantar flexion  4/5  dorsiflexion  4/5 LLE: knee  5/5     thigh  5/5     plantar flexion   5/5    dorsiflexion  5 /5 Sensory: Sensation is symmetric to light touch in all fours extremities. Slight decreased sensation to light touch dorsal feet. No loss of sensory to toes. Thighs are same. Tibial is more dull on right. Extinction absent to DSS.  Plantars: Toes are downgoing bilaterally.  Cerebellar: No ataxia noted with FNF and HKS bilaterally.   I have reviewed labs in epic and the pertinent results are: INR  .8.       aPTT  30.         Creatinine 1.10.  Glucose 113.  NCT head  No evidence of acute intracranial abnormality.  ASPECTS is 10.   CTA head and neck  No large vessel occlusion or proximal hemodynamically significant stenosis in the head or neck.   Assessment: 51 yo female with stroke risk factors of HTN who had negative imaging in stroke code and was outside the window for TNK. Stuttering found on communication, which is not typical for aphasia. Given her problems lately with depression and anxiety after COVID, we believe her presentation is likely functional.   Plan: - MRI brain without contrast. If MRI brain is negative for stroke, patient does not need further stroke w/up.  -If MRI brain is + for stroke, will need to be in patient status for full stroke workup.    Patient seen by Jimmye NormanKaren Kirby-Graham, MSN, APN-BC, nurse practitioner and  by MD. Note/plan to be edited by MD as needed.  Pager: [phone]  Neurology Attending Attestation   I examined the patient and discussed plan with Ms. Tereso NewcomerKirby-Graham NP. Above note has been edited by me to reflect my findings and recommendations. I was present throughout the stroke code and made all significant decisions and personally reviewed CNS imaging.  Patient has been recently seen by neurology as an outpatient for cognitive disorder which was felt to be largely functional given her performance on neurocognitive testing as described in Dr. Marca AnconaStewart's evaluation:  "Results of neuropsychological assessment are deemed invalid and uninterpretable on the basis of multiple standalone and embedded performance validity indicators falling well below the level of expectations. There are also unexpected, improbable patterns of performance. In the context of these findings, low scores should not be interpreted as evidence of invalid performance. Intact scores may be interpreted as reflective of at least adequate abilities, though actual abilities may be a bit better. She performed at a reasonable level on a measure of visuospatial construction, on some measures of digit repetition, on a measure of visual object confrontation naming, and on verbal fluency measures. Interestingly, she did well on a challenging measure of executive abilities involving attention, working memory, and problem solving. Low scores were obtained in other areas, of uncertain significance in the context of performance validity assessment. She is reporting extreme levels of psychiatric symptoms, scoring at the most extreme level possible on self-report measures of depression and anxiety. Results of this evaluation are thus consistent with performance validity problems. The etiology of her cognitive difficulties is unclear, although I would note that the extreme problems she reports seems difficult to attribute exclusively to long COVID given  what has been reported about  the condition in the literature. She does appear quite depressed and reports significant depression. The possibility that psychological factors are causing or contributing to her cognitive problems should be strongly considered."  Her examination today is highly effort dependent with stuttering speech and giveway weakness. Suspicion for ischemic event is very low. CT NAICP, CTA no LVO. If MRI brain is normal, patient may be discharged home to f/u with established outpatient neurology provider.     Bing Neighborsolleen Nareh Matzke, MD [facility] [phone]   If 7pm- 7am, please page neurology on call as listed in [facility].

## 2021-08-28 NOTE — Code Documentation (Signed)
Pt is a 51 yr old female with h/o CKD on no blood thinner. She was in her usual state of health this morning at 0330 per husband. Later this morning, She tried to get up and fell, and was having speech difficulties. EMS was called. Code stroke activated by EMS at 0913. Pt arrives to [facility] at 0925. Airway, labs, CBG obtained at bridge. Pt taken to CT at 0930. Pt with drift in rt arm and leg. She reports decreased sensation on right. Her speech is stuttering with no aphasia or dysarthria noted. CTNC neg for acute hemorrhage per Dr Selina Cooley. CTA obtained. Per Dr. Selina Cooley, CTA is negative for LVO. Pt taken to room Tra B where her workup will continue.No IV thrombolytic as OOW. No NIR as LVO negative. She will need q 2 hr VS and mNIHSS. Bedside handoff with Milinda Hirschfeld.

## 2021-08-28 NOTE — ED Triage Notes (Signed)
Patient BIB [facility] from home with complaint of right sided weakness, stuttering speech, and decreased sensation on right side of body, LKW 0330, then at approximately 0815 patient fell. Patient also complains of headache x3 days.

## 2021-08-28 NOTE — Discharge Instructions (Addendum)
Your MRI does not show any evidence of stroke. Labs show no evidence of acute abnormality although you have some mild anemia Please call your doctor today for recheck this week Return to the emergency department you are having worsening symptoms.

## 2021-08-28 NOTE — ED Provider Notes (Signed)
[facility] EMERGENCY DEPARTMENT Provider Note   CSN: [id number] Arrival date & time: 08/28/21  [id number]  An emergency department physician performed an initial assessment on this suspected stroke patient at 0927.  History  Chief Complaint  Patient presents with   Code Stroke    Lulla ARMANI GIESEKING is a 51 y.o. female.  HPI Level 35 caveat 51 year old female history of COVID, TIA, retention presents today with reports of stroke.  Last known normal reported at 3:30 AM.  History is obtained from EMS.  They report they obtained the history from her husband.  He stated that she was normal at 330 and he heard her this morning at 930 with garbled speech and right-sided weakness.  EMS reports prehospital hypertension, normal blood sugar, right-sided weakness on exam.    Home Medications Prior to Admission medications   Medication Sig Start Date End Date Taking? Authorizing Provider  albuterol (VENTOLIN HFA) 108 (90 Base) MCG/ACT inhaler Inhale 2 puffs into the lungs every 4 (four) hours as needed for wheezing or shortness of breath. 03/02/21   Laurey Morale, MD  amLODipine (NORVASC) 10 MG tablet Take 1 tablet (10 mg total) by mouth daily. 07/12/20   Laurey Morale, MD  DULoxetine (CYMBALTA) 60 MG capsule Take 1 capsule (60 mg total) by mouth 2 (two) times daily. 04/20/21   Laurey Morale, MD  fluticasone furoate-vilanterol (BREO ELLIPTA) 100-25 MCG/INH AEPB Inhale 1 puff into the lungs daily. 01/18/21   Laurin Coder, MD  gabapentin (NEURONTIN) 600 MG tablet Take 1 tablet (600 mg total) by mouth 3 (three) times daily. 04/20/21   Laurey Morale, MD  latanoprost (XALATAN) 0.005 % ophthalmic solution Place 1 drop into both eyes at bedtime. 09/28/19   [provider]  losartan-hydrochlorothiazide (HYZAAR) 100-25 MG tablet Take 1 tablet by mouth daily. 07/12/20   Laurey Morale, MD  meloxicam (MOBIC) 7.5 MG tablet Take 1 tablet (7.5 mg total) by mouth daily. 08/12/21   Carlisle Cater, PA-C  metoprolol succinate (TOPROL-XL) 50 MG 24 hr tablet Take 1 tablet (50 mg total) by mouth daily. Take with or immediately following a meal. 11/20/20   Laurey Morale, MD  montelukast (SINGULAIR) 10 MG tablet Take 1 tablet (10 mg total) by mouth at bedtime. 03/02/21   Laurey Morale, MD  omeprazole (PRILOSEC OTC) 20 MG tablet Take 1 tablet (20 mg total) by mouth daily. 11/30/20 11/30/21  Fenton Foy, NP  phenazopyridine (PYRIDIUM) 200 MG tablet Take 1 tablet (200 mg total) by mouth 3 (three) times daily as needed for pain. 10/15/20   Ardis Hughs, MD  potassium chloride SA (KLOR-CON) 20 MEQ tablet Take 1 tablet (20 mEq total) by mouth 2 (two) times daily. 03/02/21   Laurey Morale, MD  traMADol (ULTRAM) 50 MG tablet Take 2 tablets (100 mg total) by mouth every 6 (six) hours as needed for moderate pain. 03/02/21   Laurey Morale, MD  tretinoin (RETIN-A) 0.05 % cream Apply 1 application topically at bedtime. 01/25/20   [provider]  zolpidem (AMBIEN) 10 MG tablet Take 1 tablet (10 mg total) by mouth at bedtime as needed for sleep. 03/02/21 04/01/21  Laurey Morale, MD      Allergies    Oxycodone, Shellfish allergy, Amoxicillin, Lisinopril, and Morphine and related    Review of Systems   Review of Systems  Unable to perform ROS: Acuity of condition   Physical Exam Updated Vital Signs  BP 131/85    Pulse 84    Temp 98.7 F (37.1 C) (Oral)    Resp 14    SpO2 97%  Physical Exam Vitals and nursing note reviewed.  HENT:     Right Ear: External ear normal.     Left Ear: External ear normal.     Mouth/Throat:     Mouth: Mucous membranes are moist.     Pharynx: Oropharynx is clear.  Eyes:     Pupils: Pupils are equal, round, and reactive to light.  Cardiovascular:     Rate and Rhythm: Normal rate.  Pulmonary:     Effort: Pulmonary effort is normal.  Abdominal:     General: Abdomen is flat.  Musculoskeletal:        General: Normal range of motion.     Cervical back:  Normal range of motion.  Skin:    General: Skin is warm.     Capillary Refill: Capillary refill takes less than 2 seconds.  Neurological:     Mental Status: She is alert.     Comments: Stuttering speech with some right palmar drift  Psychiatric:        Mood and Affect: Mood normal.        Behavior: Behavior normal.    ED Results / Procedures / Treatments   Labs (all labs ordered are listed, but only abnormal results are displayed) Labs Reviewed  CBC - Abnormal; Notable for the following components:      Result Value   RBC 3.60 (*)    Hemoglobin 11.3 (*)    HCT 34.5 (*)    All other components within normal limits  COMPREHENSIVE METABOLIC PANEL - Abnormal; Notable for the following components:   Glucose, Bld 118 (*)    Creatinine, Ser 1.23 (*)    Calcium 8.7 (*)    GFR, Estimated 54 (*)    All other components within normal limits  I-STAT CHEM 8, ED - Abnormal; Notable for the following components:   Creatinine, Ser 1.10 (*)    Glucose, Bld 113 (*)    Hemoglobin 11.6 (*)    HCT 34.0 (*)    All other components within normal limits  CBG MONITORING, ED - Abnormal; Notable for the following components:   Glucose-Capillary 121 (*)    All other components within normal limits  PROTIME-INR  APTT  DIFFERENTIAL  I-STAT BETA HCG BLOOD, ED ([facility], [facility], [facility] ONLY)    EKG EKG Interpretation  Date/Time:  Tuesday August 28 2021 09:45:56 EST Ventricular Rate:  104 PR Interval:  187 QRS Duration: 78 QT Interval:  330 QTC Calculation: 434 R Axis:   71 Text Interpretation: Sinus tachycardia Confirmed by Pattricia Boss [phone]) on 08/28/2021 10:27:53 AM  Radiology MR BRAIN WO CONTRAST  Result Date: 08/28/2021 CLINICAL DATA:  Neuro deficit, acute, stroke suspected; right-sided weakness EXAM: MRI HEAD WITHOUT CONTRAST TECHNIQUE: Multiplanar, multiecho pulse sequences of the brain and surrounding structures were obtained without intravenous contrast. COMPARISON:  05/18/2021 images only,  report dictated by non-radiologist is not reviewed FINDINGS: Brain: There is no acute infarction or intracranial hemorrhage. There is no intracranial mass, mass effect, or edema. There is no hydrocephalus or extra-axial fluid collection. Ventricles and sulci are normal in size and configuration. Few patchy small foci of T2 hyperintensity in the supratentorial white matter likely reflect nonspecific gliosis/demyelination similar to prior study. Vascular: Major vessel flow voids at the skull base are preserved. Skull and upper cervical spine: Normal marrow signal is preserved. Sinuses/Orbits:  Paranasal sinuses are aerated. Orbits are unremarkable. Other: Sella is unremarkable.  Mastoid air cells are clear. IMPRESSION: No evidence of recent infarction, hemorrhage, or mass. Stable minor burden of nonspecific gliosis/demyelination in the cerebral white matter. Electronically Signed   By: Macy Mis M.D.   On: 08/28/2021 13:47   CT HEAD CODE STROKE WO CONTRAST  Result Date: 08/28/2021 CLINICAL DATA:  Code stroke.  Neuro deficit, acute, stroke suspected EXAM: CT HEAD WITHOUT CONTRAST TECHNIQUE: Contiguous axial images were obtained from the base of the skull through the vertex without intravenous contrast. COMPARISON:  Right MRI 05/18/2021, without report. CT head 05/06/2015. FINDINGS: Brain: No evidence of acute large vascular territory infarction, hemorrhage, hydrocephalus, extra-axial collection or mass lesion/mass effect. Vascular: No hyperdense vessel identified. Skull: No acute fracture. Sinuses/Orbits: Sinuses are largely clear.  Unremarkable orbits. Other: No mastoid effusions. ASPECTS [facility] Stroke Program Early CT Score) total score (0-10 with 10 being normal): 10. IMPRESSION: No evidence of acute intracranial abnormality.  ASPECTS is 10. Code stroke imaging results were communicated on 08/28/2021 at 9:38 am to provider Dr. Quinn Axe via secure text paging. Electronically Signed   By: Margaretha Sheffield M.D.    On: 08/28/2021 09:40   CT ANGIO HEAD NECK W WO CM (CODE STROKE)  Result Date: 08/28/2021 CLINICAL DATA:  Neuro deficit, acute, stroke suspected EXAM: CT ANGIOGRAPHY HEAD AND NECK TECHNIQUE: Multidetector CT imaging of the head and neck was performed using the standard protocol during bolus administration of intravenous contrast. Multiplanar CT image reconstructions and MIPs were obtained to evaluate the vascular anatomy. Carotid stenosis measurements (when applicable) are obtained utilizing NASCET criteria, using the distal internal carotid diameter as the denominator. CONTRAST:  65mL OMNIPAQUE IOHEXOL 350 MG/ML SOLN COMPARISON:  None. FINDINGS: CTA NECK FINDINGS Aortic arch: Great vessel origins are patent without significant stenosis. Right carotid system: No evidence of dissection, stenosis (50% or greater) or occlusion. Left carotid system: No evidence of dissection, stenosis (50% or greater) or occlusion. Vertebral arteries: Mildly left dominant. No evidence of dissection, stenosis (50% or greater) or occlusion. Skeleton: Degenerative disc disease greatest at C5-C6. Other neck: No acute abnormality. Upper chest: Visualized lung apices are clear. Review of the MIP images confirms the above findings CTA HEAD FINDINGS Anterior circulation: Partially azygous ACA with small right ACA. Otherwise, bilateral intracranial ICAs, MCAs, and ACAs are patent. No aneurysm identified. Posterior circulation: Bilateral intradural vertebral arteries, basilar artery and posterior cerebral arteries are patent without proximal hemodynamically significant stenosis. No aneurysm identified. Venous sinuses: As permitted by contrast timing, patent. Anatomic variants: Detailed above. Review of the MIP images confirms the above findings IMPRESSION: No large vessel occlusion or proximal hemodynamically significant stenosis in the head or neck. Findings communicated to Dr. Quinn Axe via secure text paging at 9:49 a.m. Electronically Signed    By: Margaretha Sheffield M.D.   On: 08/28/2021 09:52    Procedures Procedures    Medications Ordered in ED Medications  sodium chloride flush (NS) 0.9 % injection 3 mL (has no administration in time range)  LORazepam (ATIVAN) injection 1 mg (has no administration in time range)  iohexol (OMNIPAQUE) 350 MG/ML injection 50 mL (50 mLs Intravenous Contrast Given 08/28/21 0941)  LORazepam (ATIVAN) injection 1 mg (1 mg Intravenous Given 08/28/21 1028)    ED Course/ Medical Decision Making/ A&P Clinical Course as of 08/28/21 1405  Tue Aug 28, 2021  1027 CBC reviewed hemoglobin slightly anemic at 11.6 which is stable from prior Chemistry with stable creatinine mild hyperglycemia Pregnancy  test negative [DR]  [id number] MRI results reviewed and no evidence of acute stroke. [DR]    Clinical Course User Index [DR] Pattricia Boss, MD                           Medical Decision Making Patient presented today via EMS as code stroke with last known normal at 330.  At 9:30 AM she presented to the ED with garbled speech and acute right-sided weakness.  She was seen and evaluated by Dr. Livia Snellen.  CT and CTA obtained without evidence of acute abnormality  Amount and/or Complexity of Data Reviewed Independent Historian: EMS External Data Reviewed: labs and notes.    Details: Reviewed prior outpatient1.  [place] [facility] [place] [facility] was considered to be invalid due to the validity scoring Labs: ordered. Decision-making details documented in ED Course. Radiology: ordered and independent interpretation performed. Decision-making details documented in ED Course. ECG/medicine tests: ordered and independent interpretation performed. Decision-making details documented in ED Course. Discussion of management or test interpretation with external provider(s): Patient presents today with some right palmar drift, difficulty walking, and speech is stuttering.  Discussed with neurology.  They feel  this is likely to be a high component of functional issues.  Plan MRI to evaluate for stroke.  Patient is receiving Ativan.  Likely discharge if MRI negative With improved speech.  She is reassured regarding the MRI and other imaging study.  Is given additional dose of Ativan.  She appears stable for discharge to home. Labs show some mild anemia which has been stable You are given some medication here in the ED which should help with your symptoms Please recheck with your doctor this week Return to the emergency department if you are worse anytime.  Final Clinical Impression(s) / ED Diagnoses Final diagnoses:  Slurred speech  Right sided weakness    Rx / DC Orders ED Discharge Orders     None         Pattricia Boss, MD 08/28/21 [phone]

## 2021-08-28 NOTE — ED Notes (Signed)
Patient transported to MRI 

## 2021-08-31 ENCOUNTER — Encounter: Payer: Self-pay | Admitting: Family Medicine

## 2021-08-31 ENCOUNTER — Ambulatory Visit: Payer: 59 | Admitting: Family Medicine

## 2021-08-31 VITALS — BP 148/96 | HR 75 | Temp 98.7°F | Wt 173.2 lb

## 2021-08-31 DIAGNOSIS — I10 Essential (primary) hypertension: Secondary | ICD-10-CM | POA: Diagnosis not present

## 2021-08-31 DIAGNOSIS — U099 Post covid-19 condition, unspecified: Secondary | ICD-10-CM

## 2021-08-31 DIAGNOSIS — R413 Other amnesia: Secondary | ICD-10-CM

## 2021-08-31 DIAGNOSIS — G8929 Other chronic pain: Secondary | ICD-10-CM

## 2021-08-31 DIAGNOSIS — R519 Headache, unspecified: Secondary | ICD-10-CM

## 2021-08-31 DIAGNOSIS — F418 Other specified anxiety disorders: Secondary | ICD-10-CM

## 2021-08-31 DIAGNOSIS — R2990 Unspecified symptoms and signs involving the nervous system: Secondary | ICD-10-CM

## 2021-08-31 MED ORDER — GABAPENTIN 300 MG PO CAPS: 300.0000 mg | ORAL_CAPSULE | Freq: Two times a day (BID) | ORAL | 0 refills | Status: DC

## 2021-08-31 MED ORDER — AMLODIPINE BESYLATE 10 MG PO TABS: 10.0000 mg | ORAL_TABLET | Freq: Every day | ORAL | 3 refills | Status: DC

## 2021-08-31 MED ORDER — DULOXETINE HCL 60 MG PO CPEP: 60.0000 mg | ORAL_CAPSULE | Freq: Two times a day (BID) | ORAL | 3 refills | Status: DC

## 2021-08-31 MED ORDER — LOSARTAN POTASSIUM-HCTZ 100-25 MG PO TABS: 1.0000 | ORAL_TABLET | Freq: Every day | ORAL | 3 refills | Status: DC

## 2021-08-31 MED ORDER — MECLIZINE HCL 25 MG PO TABS: 25.0000 mg | ORAL_TABLET | Freq: Four times a day (QID) | ORAL | 5 refills | Status: AC | PRN

## 2021-08-31 NOTE — Progress Notes (Signed)
Subjective:    Patient ID: Cynthia Roach, female    DOB: 05-24-1971, 51 y.o.   MRN: [id number]  HPI Here with her husband to follow up on ED visits on 08-12-21 and on 08-28-21. The first visit was for right knee pain after she fell out of bed into the floor. She did not strike her head on anything, and there was no LOC. Labs were normal that day. Xrays of the right knee were normal except for a small joint effusion. Now her knee is better, but she has had a mild headache ever since. Then she presented at the second visit for the sudden onset of slurred speech and right sided weakness. Workup included a CT angiogram of the head and an MRI of the brain. Both of these were unremarkable. It was not clear at that point whether she may have had a TIA event or whether this was all psychosomatic from her depression. She was sent home with no medication changes,  and told to follow up with [place]. Since then her speech has returned to normal. The right sided weakness still shows up intermittently. Of note she met with Alphonzo Severance on 05-17-21 for a psychological evaluation of a neurocognitive disorder. He concluded that most of her perceived symptoms were manifestations of depression. Today Vasti says adamantly "I am not depressed" and her husband says "that man was worthless". She says she tried to go back to work at [employer] a few weeks ago, but after one day decided she could not tolerate it, mostly due to feeling overwhelmed by the information she had to process quickly. She last saw Dr. Marcial Pacas on 02-15-21 for a Neurology visit. They both say they do not want to go back there because they did not find it helpful. Fredna's husband shows me a video he took with his cell phone recently of Deann walking up the sidewalk to their house. Her entire body seems to bounce up and down several times with each step.    Review of Systems  Constitutional:  Positive for fatigue.  Respiratory: Negative.     Cardiovascular: Negative.   Gastrointestinal: Negative.   Genitourinary: Negative.   Neurological:  Positive for weakness and headaches.  Psychiatric/Behavioral:  Positive for dysphoric mood. Negative for agitation, confusion, hallucinations, self-injury, sleep disturbance and suicidal ideas. The patient is not nervous/anxious.       Objective:   Physical Exam Constitutional:      Appearance: Normal appearance.     Comments: In a wheelchair   Cardiovascular:     Rate and Rhythm: Normal rate and regular rhythm.     Pulses: Normal pulses.     Heart sounds: Normal heart sounds.  Pulmonary:     Effort: Pulmonary effort is normal.     Breath sounds: Normal breath sounds.  Neurological:     Mental Status: She is alert and oriented to person, place, and time.     Cranial Nerves: No cranial nerve deficit.  Psychiatric:        Behavior: Behavior normal.        Thought Content: Thought content normal.     Comments: She appears to be quite depressed with poor eye contact          Assessment & Plan:  Josely is still struggling with neurologic symptoms after her Covid-19 infection. She is clearly not able to work at this time. The video on her husband's cell phone shows her gait to be unlike anything  that I have seen before with patients. I do not believe she is malingering. However it is difficult to ascribe her symptoms to purely neurologic problems. She seems to be quite depressed, although she still takes Duloxetine 60 mg BID. We all agreed that she would benefit from a second neurological evaluation at a local tertiary care institution, so we will make this referral asap. In the meantime we agreed to wean her off Gabapentin since can be so sedating. She will change to taking 300 mg BID. We spent a total of ( 34  ) minutes reviewing records and discussing these issues.  Alysia Penna, MD

## 2021-09-01 ENCOUNTER — Other Ambulatory Visit: Payer: Self-pay | Admitting: Family Medicine

## 2021-09-04 NOTE — Telephone Encounter (Signed)
Pt LOV was 08/31/2021 Last refill done on 04/01/2021 Please advise

## 2021-09-07 ENCOUNTER — Encounter: Payer: Self-pay | Admitting: Family Medicine

## 2021-09-07 DIAGNOSIS — R2990 Unspecified symptoms and signs involving the nervous system: Secondary | ICD-10-CM

## 2021-09-10 NOTE — Telephone Encounter (Signed)
I di the referral

## 2021-10-09 ENCOUNTER — Telehealth: Payer: Self-pay

## 2021-10-09 NOTE — Telephone Encounter (Signed)
I filled out their referral form. Please fax this and medical records to the number she indicated

## 2021-10-10 NOTE — Telephone Encounter (Signed)
Pt Forms faxed to [facility], pt was notified via MyChart

## 2021-10-10 NOTE — Telephone Encounter (Signed)
Pt form was faxed to [facility] as requested by pt, send a MyChart message to pt regarding this

## 2021-11-27 ENCOUNTER — Other Ambulatory Visit: Payer: Self-pay | Admitting: Nurse Practitioner

## 2022-01-04 ENCOUNTER — Other Ambulatory Visit: Payer: Self-pay | Admitting: Family Medicine

## 2022-01-09 ENCOUNTER — Other Ambulatory Visit: Payer: Self-pay

## 2022-01-09 MED ORDER — METOPROLOL SUCCINATE ER 50 MG PO TB24: 50.0000 mg | ORAL_TABLET | Freq: Every day | ORAL | 0 refills | Status: DC

## 2022-04-04 ENCOUNTER — Other Ambulatory Visit: Payer: Self-pay | Admitting: Family Medicine

## 2022-04-04 DIAGNOSIS — Z8616 Personal history of COVID-19: Secondary | ICD-10-CM

## 2022-04-10 ENCOUNTER — Other Ambulatory Visit: Payer: Self-pay | Admitting: Family Medicine

## 2022-04-10 ENCOUNTER — Other Ambulatory Visit: Payer: Self-pay

## 2022-04-10 DIAGNOSIS — Z8616 Personal history of COVID-19: Secondary | ICD-10-CM

## 2022-04-10 MED ORDER — ZOLPIDEM TARTRATE 10 MG PO TABS
10.0000 mg | ORAL_TABLET | Freq: Every evening | ORAL | 1 refills | Status: DC | PRN
Start: 2022-04-10 — End: 2022-12-25

## 2022-04-10 NOTE — Telephone Encounter (Signed)
Last refill- 09/04/21-90 tabs, 1 refill Last OV- 08/31/21  No future OV scheduled.

## 2022-05-07 IMAGING — MR MR HEAD W/O CM
12 of 13 series · 43 of 48 positions shown · non-contrast
Comparison: 05/18/2021 images only, report dictated by
non-radiologist is not reviewed

CLINICAL DATA: Neuro deficit, acute, stroke suspected; right-sided
weakness

EXAM:
MRI HEAD WITHOUT CONTRAST
TECHNIQUE: Multiplanar, multiecho pulse sequences of the brain and surrounding
structures were obtained without intravenous contrast.

[Series 9: DWI · axial · 3.0mm · 0.88mm/px · z∈[-93,+41]mm · 7 of 96 slices shown (1 of 4)]
[im 1/96]
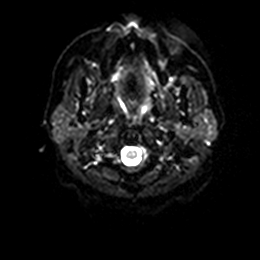
[im 16/96]
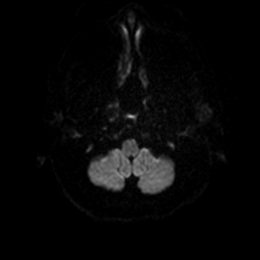
[im 32/96]
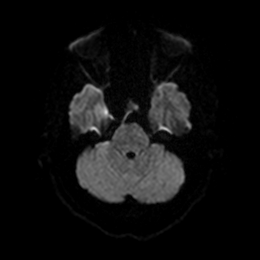
[im 48/96]
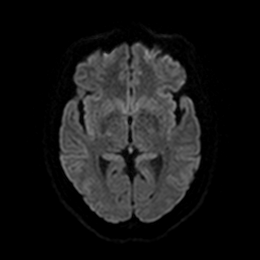
[im 64/96]
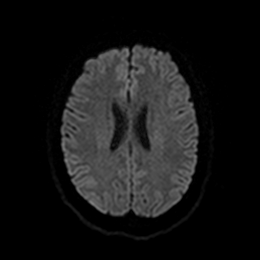
[im 80/96]
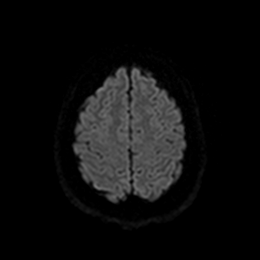
[im 96/96]
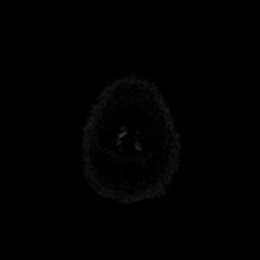

[Series 10: DWI · axial · 3.0mm · 0.88mm/px · z∈[-93,+41]mm · 4 of 48 slices shown (2 of 4)]
[im 1/48]
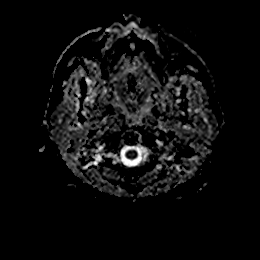
[im 16/48]
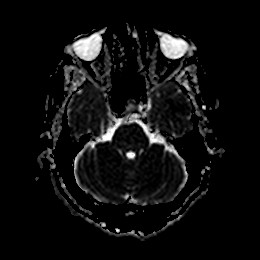
[im 32/48]
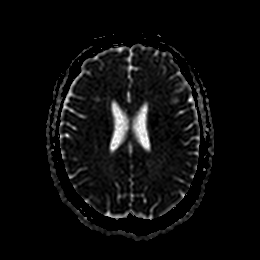
[im 48/48]
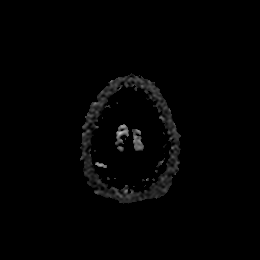

[Series 11: DWI · coronal · 4.0mm · 0.88mm/px · 5 of 64 slices shown (3 of 4)]
[im 1/64]
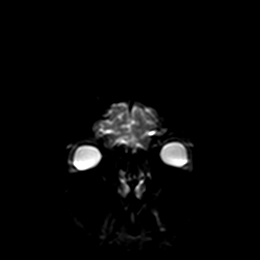
[im 16/64]
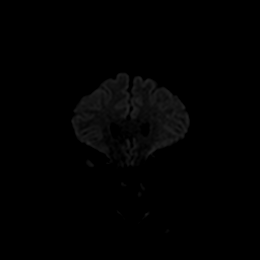
[im 32/64]
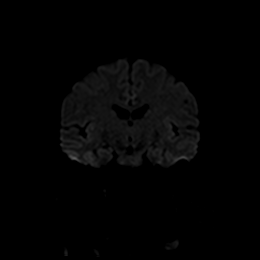
[im 48/64]
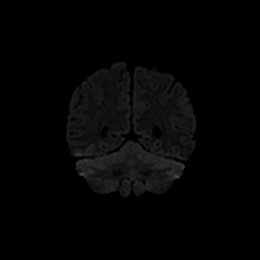
[im 64/64]
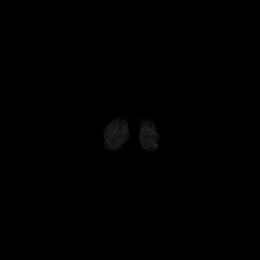

[Series 12: DWI · coronal · 4.0mm · 0.88mm/px · 3 of 32 slices shown (4 of 4)]
[im 1/32]
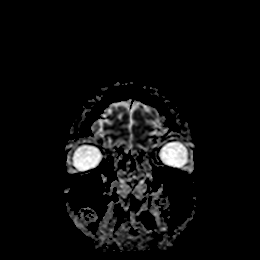
[im 16/32]
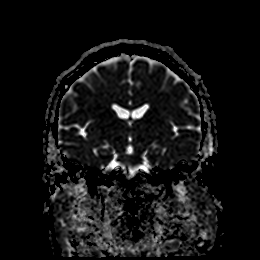
[im 32/32]
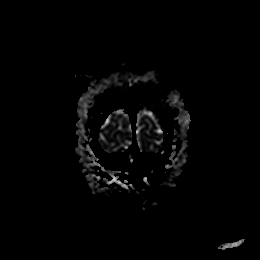

[Series 13: T1 · sagittal · 5.0mm · 0.75mm/px · 2 of 23 slices shown]
[im 1/23]
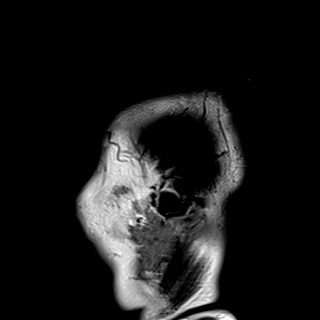
[im 23/23]
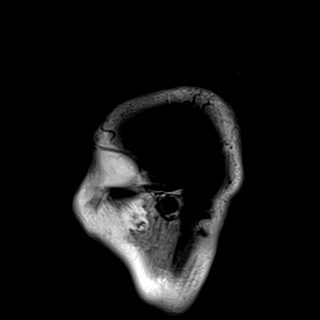

[Series 14: T2 · axial · 5.0mm · 0.72mm/px · z∈[-94,+38]mm · 2 of 24 slices shown (1 of 2)]
[im 1/24]
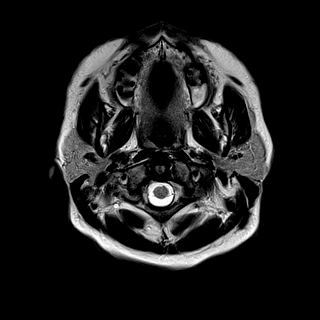
[im 24/24]
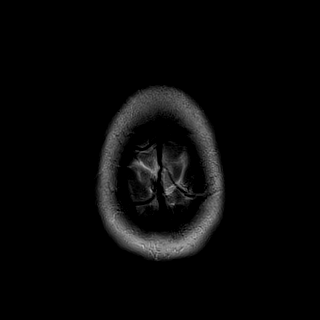

[Series 15: FLAIR · axial · 5.0mm · 0.45mm/px · z∈[-97,+34]mm · 2 of 24 slices shown]
[im 1/24]
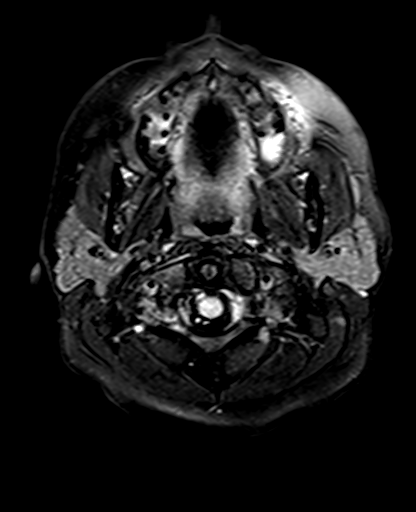
[im 24/24]
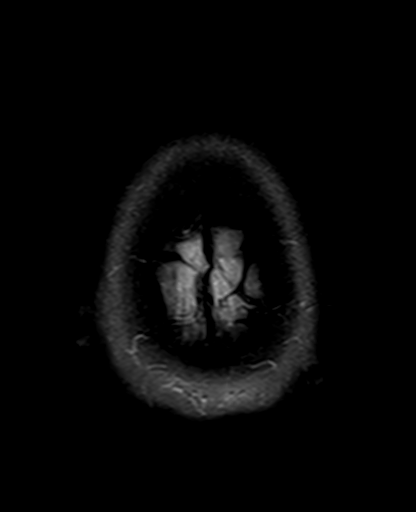

[Series 16: mag_images · axial · 3.0mm · 0.90mm/px · z∈[-98,+48]mm · 4 of 52 slices shown]
[im 1/52]
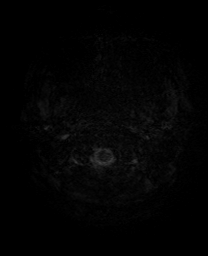
[im 18/52]
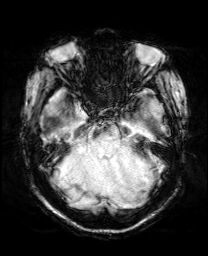
[im 35/52]
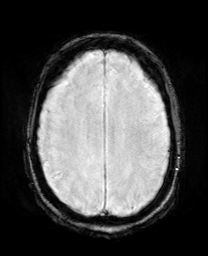
[im 52/52]
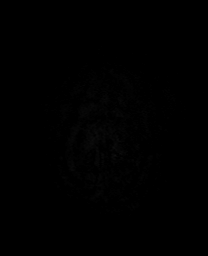

[Series 17: pha_images · axial · 3.0mm · 0.90mm/px · z∈[-98,+45]mm · 4 of 50 slices shown]
[im 1/50]
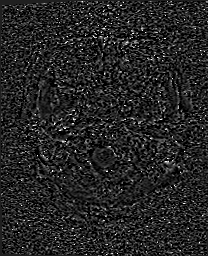
[im 17/50]
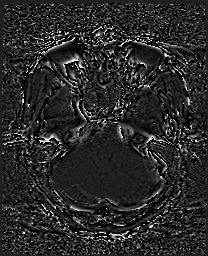
[im 33/50]
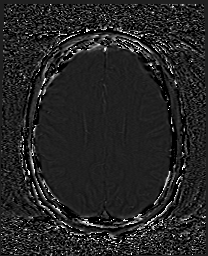
[im 50/50]
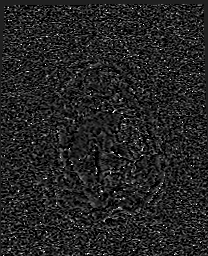

[Series 18: swi_images · axial · 3.0mm · 0.90mm/px · z∈[-98,+48]mm · 4 of 52 slices shown]
[im 1/52]
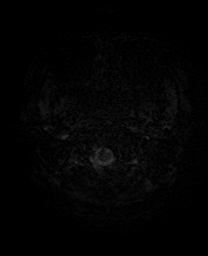
[im 18/52]
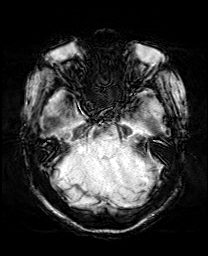
[im 35/52]
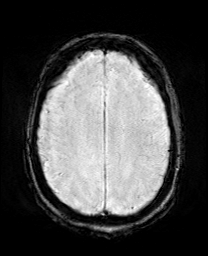
[im 52/52]
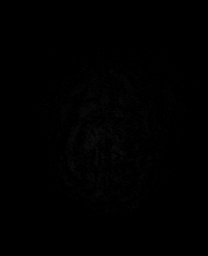

[Series 19: mip_images(sw) · axial · 24.0mm · 0.90mm/px · z∈[-88,+38]mm · 4 of 45 slices shown]
[im 1/45]
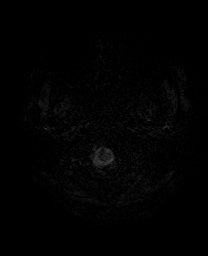
[im 15/45]
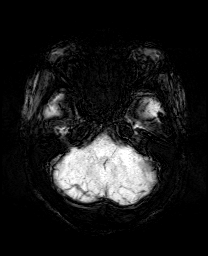
[im 30/45]
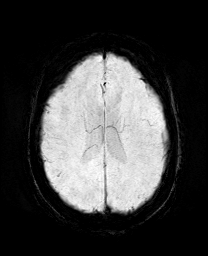
[im 45/45]
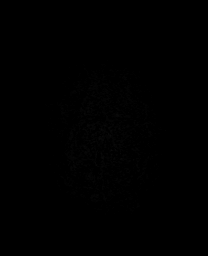

[Series 21: T2 · coronal · 5.0mm · 0.34mm/px · 2 of 27 slices shown (2 of 2)]
[im 1/27]
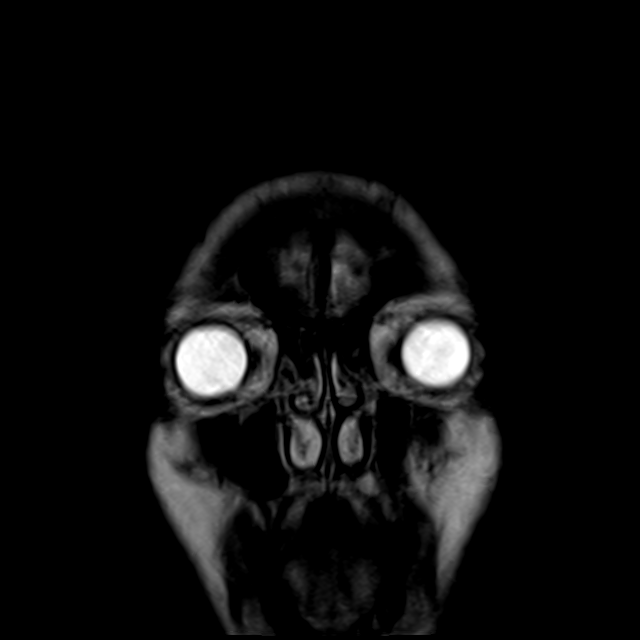
[im 27/27]
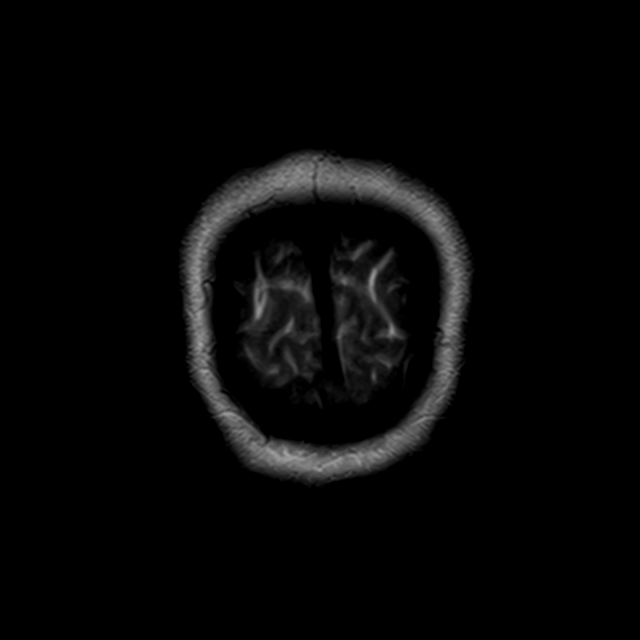

[43 of 48 positions shown; findings below may reference images not displayed]

FINDINGS: Brain: There is no acute infarction or intracranial hemorrhage.
There is no intracranial mass, mass effect, or edema. There is no
hydrocephalus or extra-axial fluid collection. Ventricles and sulci
are normal in size and configuration. Few patchy small foci of T2
hyperintensity in the supratentorial white matter likely reflect
nonspecific gliosis/demyelination similar to prior study.

Vascular: Major vessel flow voids at the skull base are preserved.

Skull and upper cervical spine: Normal marrow signal is preserved.

Sinuses/Orbits: Paranasal sinuses are aerated. Orbits are
unremarkable.

Other: Sella is unremarkable.  Mastoid air cells are clear.
IMPRESSION: No evidence of recent infarction, hemorrhage, or mass.

Stable minor burden of nonspecific gliosis/demyelination in the
cerebral white matter.

## 2022-05-07 IMAGING — CT CT HEAD CODE STROKE
4 series · 17 of 47 positions shown, 19 images · non-contrast
Comparison: Right MRI 05/18/2021, without report. CT head
05/06/2015.

CLINICAL DATA: Code stroke.  Neuro deficit, acute, stroke suspected

EXAM:
CT HEAD WITHOUT CONTRAST
TECHNIQUE: Contiguous axial images were obtained from the base of the skull
through the vertex without intravenous contrast.

[Series 3: head wo · axial · 0.43mm/px · z∈[-181,-61]mm · 7 of 34 slices shown, 9 images]
[im 5/34  brain]
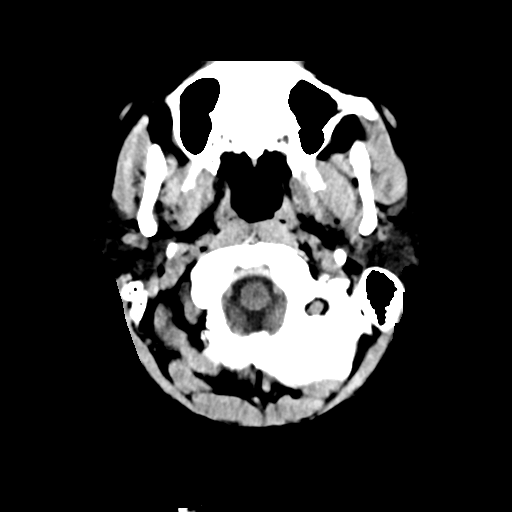
[im 5/34  bone]
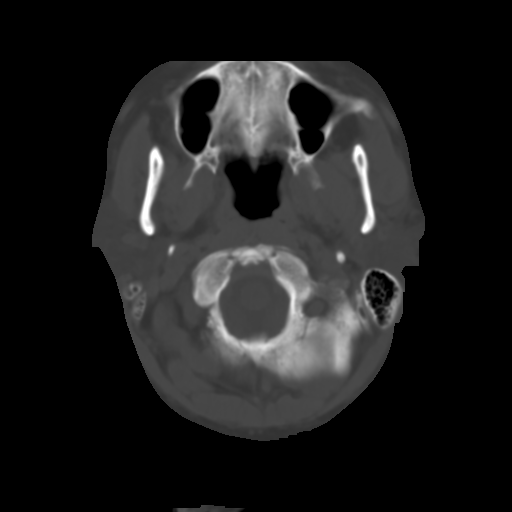
[im 9/34  brain]
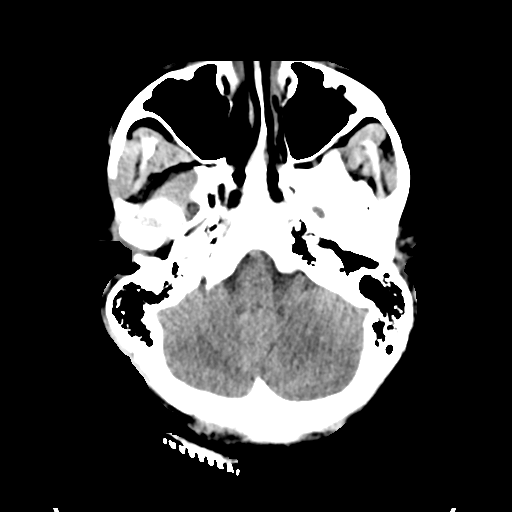
[im 13/34  brain]
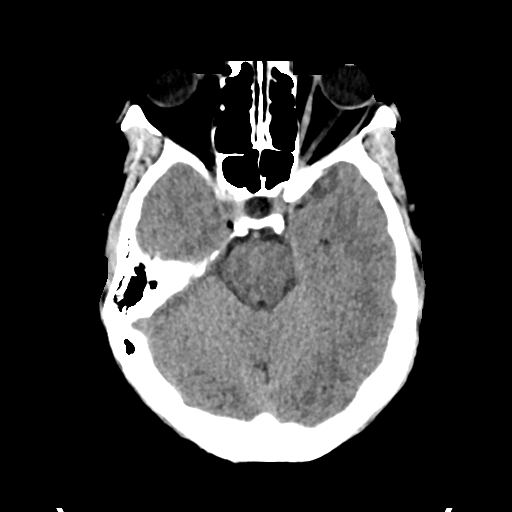
[im 17/34  brain]
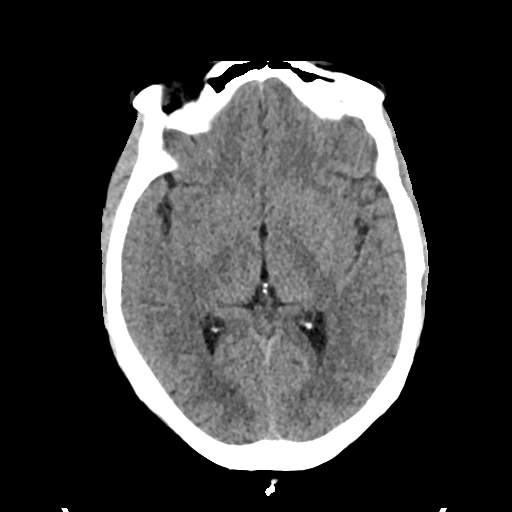
[im 21/34  brain]
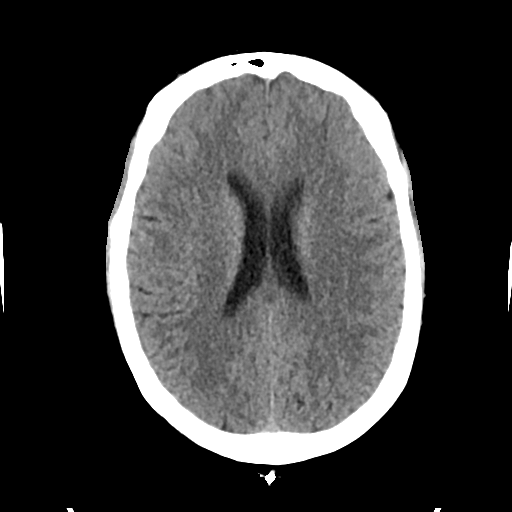
[im 21/34  bone]
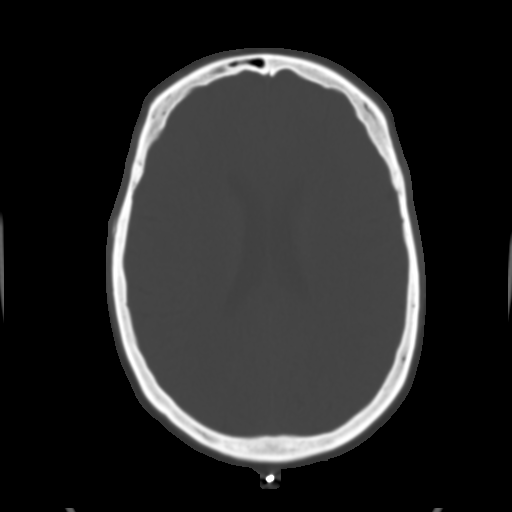
[im 25/34  brain]
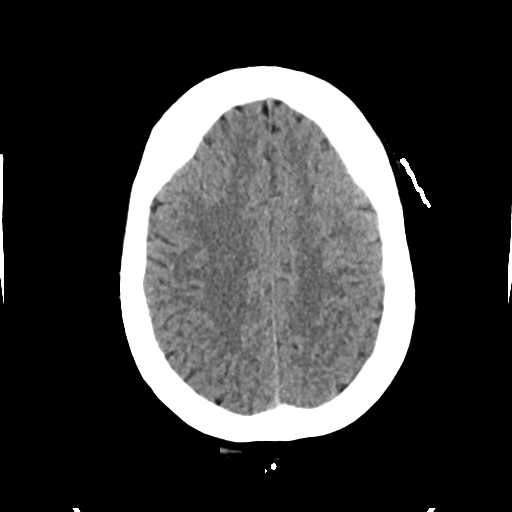
[im 29/34  brain]
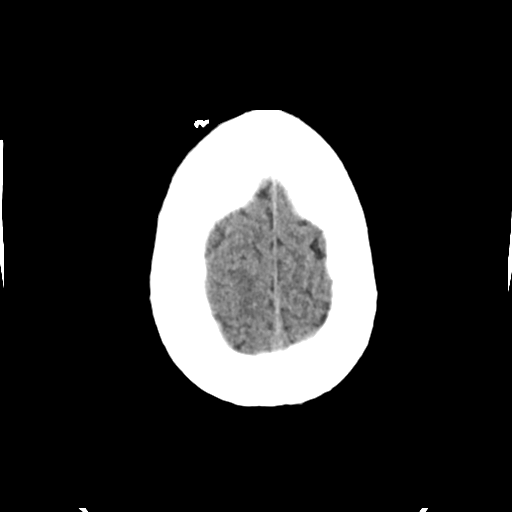

[Series 4: head bone · axial · 0.43mm/px · z∈[-185,-127]mm · 4 of 85 slices shown]
[im 9/85  bone]
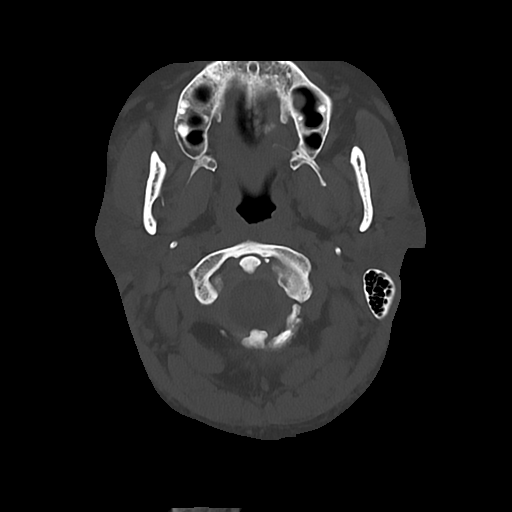
[im 17/85  bone]
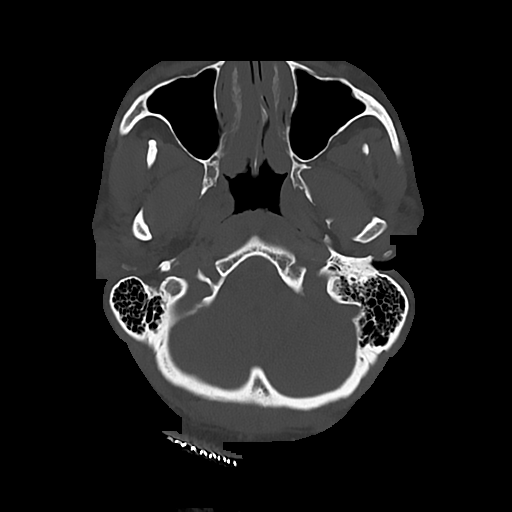
[im 26/85  bone]
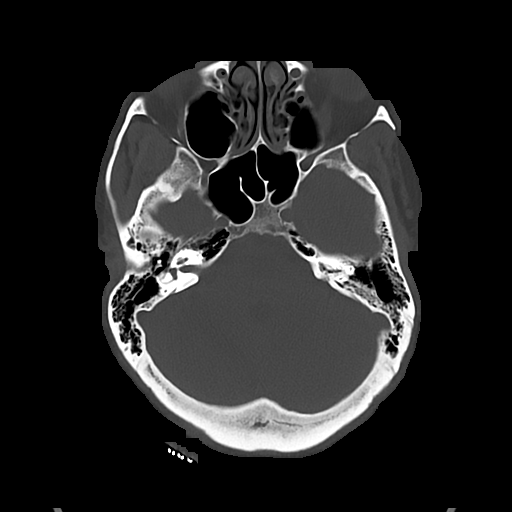
[im 38/85  bone]
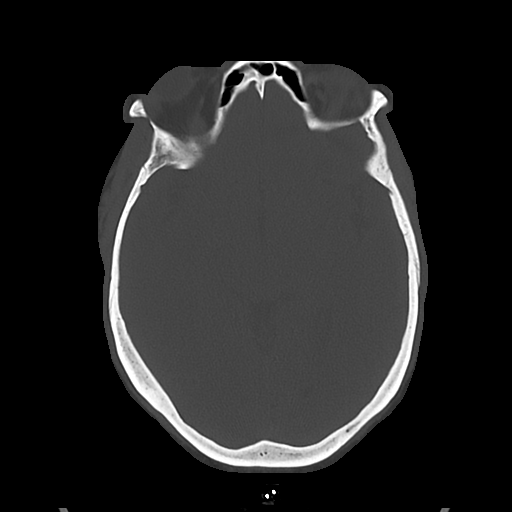

[Series 5: cor soft · coronal · 0.34mm/px · 3 of 72 slices shown]
[im 24/72  brain]
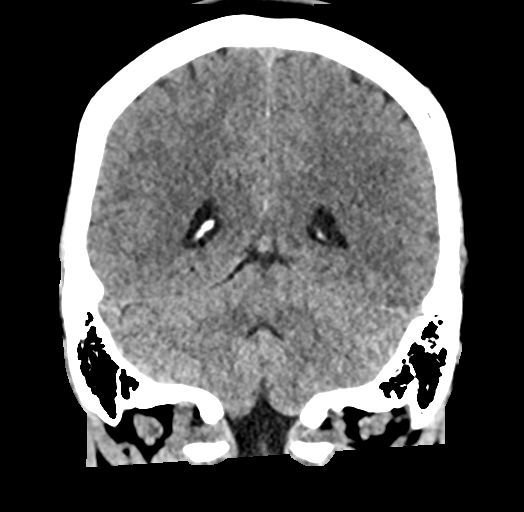
[im 32/72  brain]
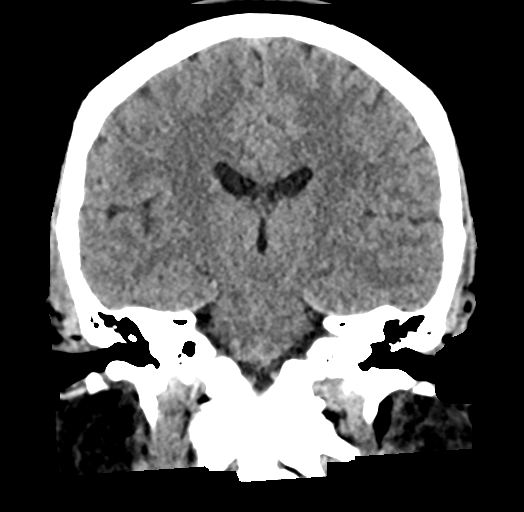
[im 40/72  brain]
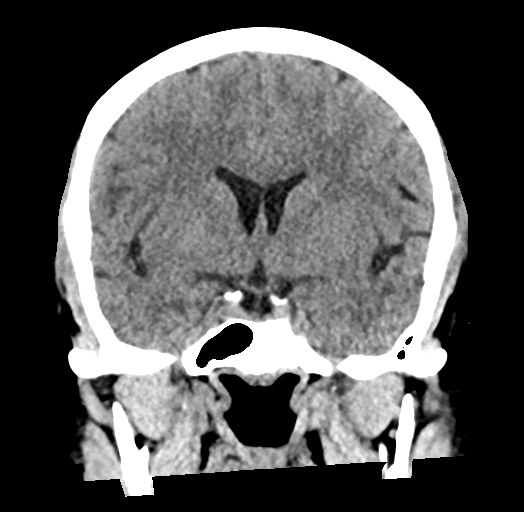

[Series 6: sag soft · sagittal · 0.34mm/px · 3 of 60 slices shown]
[im 20/60  brain]
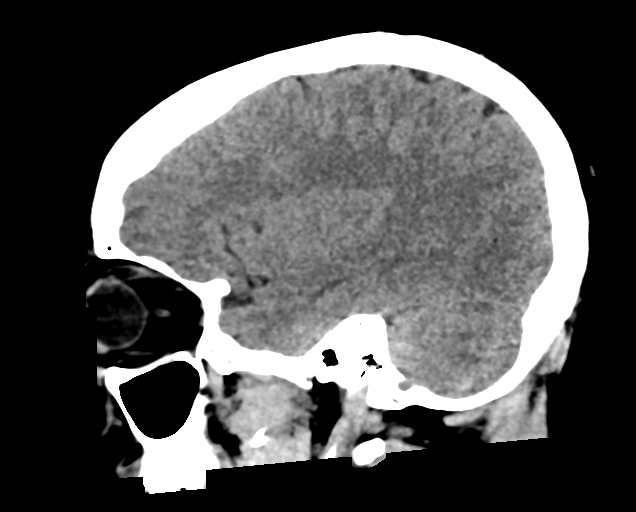
[im 30/60  brain]
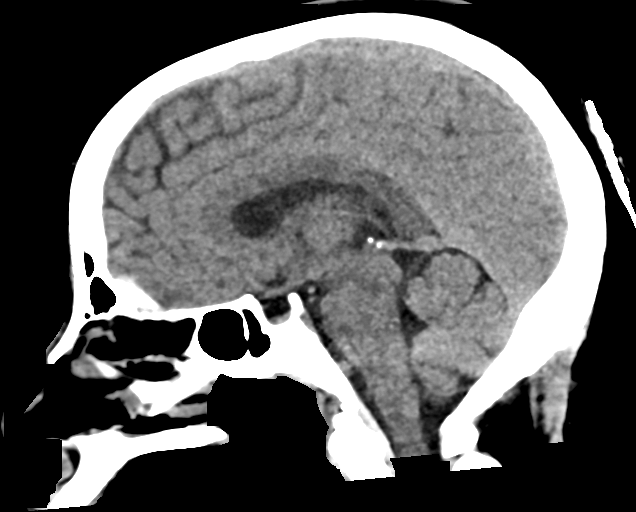
[im 40/60  brain]
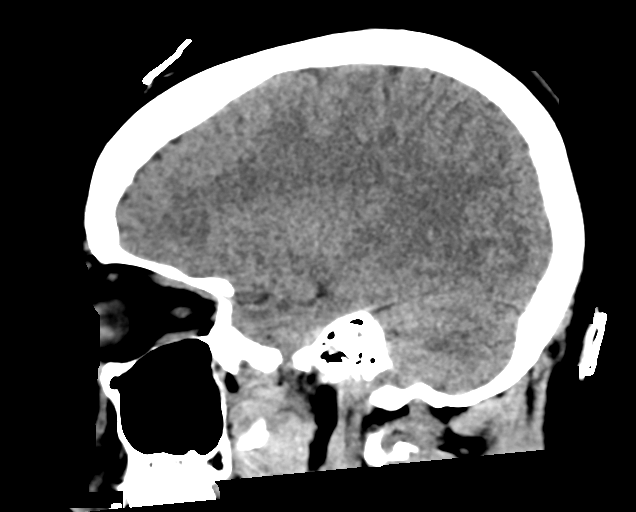

[17 of 47 positions shown; findings below may reference images not displayed]

FINDINGS: Brain: No evidence of acute large vascular territory infarction,
hemorrhage, hydrocephalus, extra-axial collection or mass
lesion/mass effect.

Vascular: No hyperdense vessel identified.

Skull: No acute fracture.

Sinuses/Orbits: Sinuses are largely clear.  Unremarkable orbits.

Other: No mastoid effusions.

ASPECTS ([place] Stroke Program Early CT Score) total score (0-10
with 10 being normal): 10.
IMPRESSION: No evidence of acute intracranial abnormality.  ASPECTS is 10.

to provider [REDACTED] via secure text paging.

## 2022-06-05 ENCOUNTER — Encounter: Payer: Self-pay | Admitting: Family Medicine

## 2022-06-05 ENCOUNTER — Ambulatory Visit: Payer: 59 | Admitting: Family Medicine

## 2022-06-05 VITALS — BP 160/100 | HR 77 | Temp 98.1°F | Wt 162.0 lb

## 2022-06-05 DIAGNOSIS — R2990 Unspecified symptoms and signs involving the nervous system: Secondary | ICD-10-CM

## 2022-06-05 DIAGNOSIS — I10 Essential (primary) hypertension: Secondary | ICD-10-CM

## 2022-06-05 DIAGNOSIS — F418 Other specified anxiety disorders: Secondary | ICD-10-CM | POA: Diagnosis not present

## 2022-06-05 DIAGNOSIS — U099 Post covid-19 condition, unspecified: Secondary | ICD-10-CM

## 2022-06-05 MED ORDER — AMPHETAMINE-DEXTROAMPHET ER 10 MG PO CP24: 10.0000 mg | ORAL_CAPSULE | Freq: Every day | ORAL | 0 refills | Status: DC

## 2022-06-05 MED ORDER — METOPROLOL SUCCINATE ER 100 MG PO TB24: 100.0000 mg | ORAL_TABLET | Freq: Every day | ORAL | 3 refills | Status: DC

## 2022-06-05 NOTE — Progress Notes (Signed)
   Subjective:    Patient ID: Cynthia Roach, female    DOB: [birth year], 51 y.o.   MRN: [id number]  HPI Here with her husband to follow up on long hauler Covid symptoms. We last saw her in January, and she was struggling then with extreme fatigue, confusion, memory loss, and depression. We started her on Cymbalta, but this did not help so she stopped it. She was also taking Gabapentin, but she stopped this a few months ago. She still takes Zolpidem at night, but she still has trouble sleeping. We referred her to the [facility], and she has had 6 virtual visits with them. She went to OT and speech therapy, and this seemed to help a little. They also gave her Naltrexone for several weeks, but this did not help. Overall she says she feels better today than she did at our last visit together. She still has profound fatigue along with headaches. Her BP has been going up.    Review of Systems  Constitutional:  Positive for fatigue. Negative for chills, diaphoresis and fever.  Respiratory: Negative.    Cardiovascular: Negative.   Gastrointestinal: Negative.   Genitourinary: Negative.   Musculoskeletal:  Positive for arthralgias and myalgias.  Psychiatric/Behavioral:  Positive for decreased concentration, dysphoric mood and sleep disturbance. Negative for agitation, confusion, hallucinations, self-injury and suicidal ideas. The patient is nervous/anxious.        Objective:   Physical Exam Constitutional:      Appearance: Normal appearance.     Comments: She looks much better than the last time I saw her. She is more alert, eye conduct is good, and she is neatly dressed.   Cardiovascular:     Rate and Rhythm: Normal rate and regular rhythm.     Pulses: Normal pulses.     Heart sounds: Normal heart sounds.  Pulmonary:     Effort: Pulmonary effort is normal.     Breath sounds: Normal breath sounds.  Neurological:     General: No focal deficit present.      Mental Status: She is alert and oriented to person, place, and time.  Psychiatric:        Mood and Affect: Mood normal.        Behavior: Behavior normal.        Thought Content: Thought content normal.           Assessment & Plan:  She is still dealing with long hauler Covid symptoms. We agreed that she would stay off any anxiety or depression medications, at least for now. She will continue the OT and speech therapy through the [facility]. Her HTN is not well controlled, so we will increase the Metoprolol succinate to 100 mg daily. For the cognitive issues, she will try Adderall XR 10 mg every morning. Recheck here in 2 weeks. We spent a total of ( 35  ) minutes reviewing records and discussing these issues.  Alysia Penna, MD

## 2022-07-04 ENCOUNTER — Other Ambulatory Visit: Payer: Self-pay | Admitting: Family Medicine

## 2022-07-10 ENCOUNTER — Other Ambulatory Visit: Payer: Self-pay | Admitting: Family Medicine

## 2022-07-10 DIAGNOSIS — Z8616 Personal history of COVID-19: Secondary | ICD-10-CM

## 2022-07-17 ENCOUNTER — Other Ambulatory Visit: Payer: Self-pay | Admitting: Family Medicine

## 2022-07-23 MED ORDER — AMPHETAMINE-DEXTROAMPHET ER 10 MG PO CP24: 10.0000 mg | ORAL_CAPSULE | Freq: Every day | ORAL | 0 refills | Status: DC

## 2022-07-23 NOTE — Telephone Encounter (Signed)
Last OV-06/05/22 Last refill-06/05/22--30 capsules, 0 refills  No future OV scheduled.

## 2022-07-31 ENCOUNTER — Ambulatory Visit: Payer: 59 | Admitting: Family Medicine

## 2022-08-05 ENCOUNTER — Ambulatory Visit: Payer: 59 | Admitting: Family Medicine

## 2022-08-05 VITALS — BP 160/102 | HR 75 | Temp 97.7°F | Wt 166.0 lb

## 2022-08-05 DIAGNOSIS — R41840 Attention and concentration deficit: Secondary | ICD-10-CM

## 2022-08-05 DIAGNOSIS — R739 Hyperglycemia, unspecified: Secondary | ICD-10-CM | POA: Diagnosis not present

## 2022-08-05 DIAGNOSIS — R000 Tachycardia, unspecified: Secondary | ICD-10-CM | POA: Diagnosis not present

## 2022-08-05 DIAGNOSIS — I10 Essential (primary) hypertension: Secondary | ICD-10-CM | POA: Diagnosis not present

## 2022-08-05 MED ORDER — POTASSIUM CHLORIDE CRYS ER 20 MEQ PO TBCR: 20.0000 meq | EXTENDED_RELEASE_TABLET | Freq: Two times a day (BID) | ORAL | 11 refills | Status: AC

## 2022-08-05 MED ORDER — AMLODIPINE BESYLATE 10 MG PO TABS: 10.0000 mg | ORAL_TABLET | Freq: Every day | ORAL | 3 refills | Status: AC

## 2022-08-05 MED ORDER — CARVEDILOL 12.5 MG PO TABS: 12.5000 mg | ORAL_TABLET | Freq: Two times a day (BID) | ORAL | 3 refills | Status: DC

## 2022-08-05 MED ORDER — LOSARTAN POTASSIUM-HCTZ 100-25 MG PO TABS: 1.0000 | ORAL_TABLET | Freq: Every day | ORAL | 3 refills | Status: AC

## 2022-08-05 NOTE — Progress Notes (Signed)
   Subjective:    Patient ID: Cynthia Roach, female    DOB: 04-08-1971, 51 y.o.   MRN: [id number]  HPI Here to discuss a number of issues. She continues to deal with long haul post-Covid symptoms, including fatigue, SOB, and memory problems. First she says the Adderall is not helping her at all and she questions if she should stop taking it. Second her BP remains high, usually in the range of 150's to 160's over 100. She has headaches and dizziness when the BP gets too high. She also has a rapid pulse most of the time.    Review of Systems  Constitutional:  Positive for fatigue.  Respiratory:  Positive for shortness of breath. Negative for cough and wheezing.   Cardiovascular:  Positive for palpitations. Negative for chest pain and leg swelling.  Gastrointestinal: Negative.   Genitourinary: Negative.   Neurological:  Positive for dizziness and headaches. Negative for seizures and speech difficulty.  Psychiatric/Behavioral:  Positive for decreased concentration, dysphoric mood and sleep disturbance. Negative for agitation, confusion, hallucinations, self-injury and suicidal ideas. The patient is nervous/anxious.        Objective:   Physical Exam Constitutional:      Appearance: Normal appearance.  Cardiovascular:     Rate and Rhythm: Regular rhythm. Tachycardia present.     Pulses: Normal pulses.     Heart sounds: Normal heart sounds.  Pulmonary:     Effort: Pulmonary effort is normal.     Breath sounds: Normal breath sounds.  Musculoskeletal:     Right lower leg: No edema.     Left lower leg: No edema.  Neurological:     General: No focal deficit present.     Mental Status: She is alert and oriented to person, place, and time.  Psychiatric:        Mood and Affect: Mood normal.        Behavior: Behavior normal.        Thought Content: Thought content normal.           Assessment & Plan:  We agreed that we would not treat the poor concentration and "brain fog" right  now, so we stopped the Adderall. For the HTN and tachycardia, we will stop Metoprolol and start on Carvedilol 12.5 mg BID. She will follow up in one month. Gershon Crane, MD

## 2022-08-06 LAB — BASIC METABOLIC PANEL
BUN: 9 mg/dL (ref 6–23)
CO2: 28 mEq/L (ref 19–32)
Calcium: 9.7 mg/dL (ref 8.4–10.5)
Chloride: 102 mEq/L (ref 96–112)
Creatinine, Ser: 1.11 mg/dL (ref 0.40–1.20)
GFR: 58.13 mL/min — ABNORMAL LOW (ref >60.00)
Glucose, Bld: 105 mg/dL — ABNORMAL HIGH (ref 70–99)
Potassium: 3.5 mEq/L (ref 3.5–5.1)
Sodium: 138 mEq/L (ref 135–145)

## 2022-08-06 LAB — CBC WITH DIFFERENTIAL/PLATELET
Basophils Absolute: 0.1 10*3/uL (ref 0.0–0.1)
Basophils Relative: 0.8 % (ref 0.0–3.0)
Eosinophils Absolute: 0 10*3/uL (ref 0.0–0.7)
Eosinophils Relative: 0.6 % (ref 0.0–5.0)
HCT: 39 % (ref 36.0–46.0)
Hemoglobin: 13 g/dL (ref 12.0–15.0)
Lymphocytes Relative: 24 % (ref 12.0–46.0)
Lymphs Abs: 2 10*3/uL (ref 0.7–4.0)
MCHC: 33.3 g/dL (ref 30.0–36.0)
MCV: 92.9 fl (ref 78.0–100.0)
Monocytes Absolute: 0.6 10*3/uL (ref 0.1–1.0)
Monocytes Relative: 6.5 % (ref 3.0–12.0)
Neutro Abs: 5.8 10*3/uL (ref 1.4–7.7)
Neutrophils Relative %: 68.1 % (ref 43.0–77.0)
Platelets: 371 10*3/uL (ref 150.0–400.0)
RBC: 4.2 Mil/uL (ref 3.87–5.11)
RDW: 13.7 % (ref 11.5–15.5)
WBC: 8.6 10*3/uL (ref 4.0–10.5)

## 2022-08-06 LAB — TSH: TSH: 0.51 u[IU]/mL (ref 0.35–5.50)

## 2022-08-06 LAB — HEPATIC FUNCTION PANEL
ALT: 12 U/L (ref 0–35)
AST: 21 U/L (ref 0–37)
Albumin: 4.3 g/dL (ref 3.5–5.2)
Alkaline Phosphatase: 105 U/L (ref 39–117)
Bilirubin, Direct: 0.1 mg/dL (ref 0.0–0.3)
Total Bilirubin: 0.6 mg/dL (ref 0.2–1.2)
Total Protein: 7.9 g/dL (ref 6.0–8.3)

## 2022-08-06 LAB — HEMOGLOBIN A1C: Hgb A1c MFr Bld: 6 % (ref 4.6–6.5)

## 2022-08-13 DIAGNOSIS — Z0271 Encounter for disability determination: Secondary | ICD-10-CM

## 2022-08-26 ENCOUNTER — Encounter: Payer: Self-pay | Admitting: Family Medicine

## 2022-08-27 NOTE — Telephone Encounter (Signed)
I really think she would benefit from seeing a psychiatrist for this, since they specialize in this type of care. I suggest she call [facility] at [phone]. No referral is needed

## 2022-11-10 ENCOUNTER — Other Ambulatory Visit: Payer: Self-pay | Admitting: Family Medicine

## 2022-11-15 ENCOUNTER — Other Ambulatory Visit: Payer: Self-pay | Admitting: Family Medicine

## 2022-11-15 DIAGNOSIS — Z8616 Personal history of COVID-19: Secondary | ICD-10-CM

## 2022-11-24 ENCOUNTER — Other Ambulatory Visit: Payer: Self-pay | Admitting: Family Medicine

## 2022-12-09 ENCOUNTER — Encounter: Payer: Self-pay | Admitting: *Deleted

## 2022-12-24 ENCOUNTER — Other Ambulatory Visit: Payer: Self-pay | Admitting: Family Medicine

## 2023-04-02 ENCOUNTER — Other Ambulatory Visit: Payer: Self-pay | Admitting: Family Medicine

## 2024-09-14 ENCOUNTER — Other Ambulatory Visit: Payer: Self-pay | Admitting: Family Medicine
